# Patient Record
Sex: Male | Born: 1965 | Race: White | Hispanic: No | Marital: Married | Smoking: Never smoker
Health system: Southern US, Community
[De-identification: ages and names within clinical notes are randomized; demographics above are authoritative.]

## PROBLEM LIST (undated history)

## (undated) ENCOUNTER — Emergency Department (HOSPITAL_BASED_OUTPATIENT_CLINIC_OR_DEPARTMENT_OTHER)

## (undated) DIAGNOSIS — K8020 Calculus of gallbladder without cholecystitis without obstruction: Secondary | ICD-10-CM

## (undated) DIAGNOSIS — F329 Major depressive disorder, single episode, unspecified: Secondary | ICD-10-CM

## (undated) DIAGNOSIS — Q632 Ectopic kidney: Secondary | ICD-10-CM

## (undated) DIAGNOSIS — F32A Depression, unspecified: Secondary | ICD-10-CM

## (undated) DIAGNOSIS — R51 Headache: Secondary | ICD-10-CM

## (undated) DIAGNOSIS — G43909 Migraine, unspecified, not intractable, without status migrainosus: Secondary | ICD-10-CM

## (undated) DIAGNOSIS — K219 Gastro-esophageal reflux disease without esophagitis: Secondary | ICD-10-CM

## (undated) DIAGNOSIS — R112 Nausea with vomiting, unspecified: Secondary | ICD-10-CM

## (undated) DIAGNOSIS — I10 Essential (primary) hypertension: Secondary | ICD-10-CM

## (undated) DIAGNOSIS — D649 Anemia, unspecified: Secondary | ICD-10-CM

## (undated) DIAGNOSIS — K602 Anal fissure, unspecified: Secondary | ICD-10-CM

## (undated) DIAGNOSIS — I671 Cerebral aneurysm, nonruptured: Secondary | ICD-10-CM

## (undated) DIAGNOSIS — H269 Unspecified cataract: Secondary | ICD-10-CM

## (undated) DIAGNOSIS — M109 Gout, unspecified: Secondary | ICD-10-CM

## (undated) DIAGNOSIS — T7840XA Allergy, unspecified, initial encounter: Secondary | ICD-10-CM

## (undated) DIAGNOSIS — F419 Anxiety disorder, unspecified: Secondary | ICD-10-CM

## (undated) DIAGNOSIS — J189 Pneumonia, unspecified organism: Secondary | ICD-10-CM

## (undated) DIAGNOSIS — Z9889 Other specified postprocedural states: Secondary | ICD-10-CM

## (undated) DIAGNOSIS — N200 Calculus of kidney: Secondary | ICD-10-CM

## (undated) HISTORY — DX: Anal fissure, unspecified: K60.2

## (undated) HISTORY — DX: Migraine, unspecified, not intractable, without status migrainosus: G43.909

## (undated) HISTORY — DX: Gout, unspecified: M10.9

## (undated) HISTORY — DX: Allergy, unspecified, initial encounter: T78.40XA

## (undated) HISTORY — DX: Calculus of gallbladder without cholecystitis without obstruction: K80.20

## (undated) HISTORY — DX: Unspecified cataract: H26.9

## (undated) HISTORY — DX: Calculus of kidney: N20.0

## (undated) HISTORY — PX: UPPER GASTROINTESTINAL ENDOSCOPY: SHX188

## (undated) HISTORY — PX: CATARACT EXTRACTION: SUR2

## (undated) HISTORY — DX: Anemia, unspecified: D64.9

## (undated) HISTORY — DX: Essential (primary) hypertension: I10

## (undated) HISTORY — PX: OTHER SURGICAL HISTORY: SHX169

## (undated) HISTORY — DX: Gilbert syndrome: E80.4

---

## 1998-09-22 ENCOUNTER — Encounter: Payer: Self-pay | Admitting: Internal Medicine

## 2002-02-24 ENCOUNTER — Emergency Department (HOSPITAL_COMMUNITY): Admission: EM | Admit: 2002-02-24 | Discharge: 2002-02-24 | Payer: Self-pay | Admitting: Emergency Medicine

## 2004-10-18 ENCOUNTER — Ambulatory Visit: Payer: Self-pay | Admitting: Family Medicine

## 2005-01-31 ENCOUNTER — Ambulatory Visit: Payer: Self-pay | Admitting: Internal Medicine

## 2006-05-16 ENCOUNTER — Encounter: Payer: Self-pay | Admitting: Internal Medicine

## 2006-09-19 HISTORY — PX: ARTERIAL ANEURYSM REPAIR: SHX556

## 2006-09-29 ENCOUNTER — Encounter: Payer: Self-pay | Admitting: Internal Medicine

## 2006-09-30 ENCOUNTER — Encounter: Payer: Self-pay | Admitting: Internal Medicine

## 2007-02-07 ENCOUNTER — Encounter: Payer: Self-pay | Admitting: Internal Medicine

## 2007-06-12 DIAGNOSIS — M109 Gout, unspecified: Secondary | ICD-10-CM | POA: Insufficient documentation

## 2007-06-12 DIAGNOSIS — K8020 Calculus of gallbladder without cholecystitis without obstruction: Secondary | ICD-10-CM

## 2007-06-12 HISTORY — DX: Calculus of gallbladder without cholecystitis without obstruction: K80.20

## 2007-09-27 ENCOUNTER — Encounter: Payer: Self-pay | Admitting: Internal Medicine

## 2008-01-21 ENCOUNTER — Ambulatory Visit: Payer: Self-pay | Admitting: Internal Medicine

## 2008-01-21 DIAGNOSIS — L42 Pityriasis rosea: Secondary | ICD-10-CM | POA: Insufficient documentation

## 2008-01-21 DIAGNOSIS — I671 Cerebral aneurysm, nonruptured: Secondary | ICD-10-CM | POA: Insufficient documentation

## 2008-07-06 ENCOUNTER — Other Ambulatory Visit: Payer: Self-pay | Admitting: Emergency Medicine

## 2008-07-06 ENCOUNTER — Ambulatory Visit: Payer: Self-pay | Admitting: Internal Medicine

## 2008-07-06 ENCOUNTER — Inpatient Hospital Stay (HOSPITAL_COMMUNITY): Admission: AD | Admit: 2008-07-06 | Discharge: 2008-07-08 | Payer: Self-pay | Admitting: Internal Medicine

## 2008-07-07 ENCOUNTER — Ambulatory Visit: Payer: Self-pay | Admitting: Internal Medicine

## 2008-07-08 ENCOUNTER — Encounter: Payer: Self-pay | Admitting: Internal Medicine

## 2008-07-14 ENCOUNTER — Ambulatory Visit: Payer: Self-pay | Admitting: Internal Medicine

## 2008-07-14 LAB — CONVERTED CEMR LAB
Basophils Absolute: 0.1 10*3/uL (ref 0.0–0.1)
Basophils Relative: 1.1 % (ref 0.0–3.0)
Eosinophils Absolute: 0.2 10*3/uL (ref 0.0–0.7)
Eosinophils Relative: 3 % (ref 0.0–5.0)
HCT: 35.1 % — ABNORMAL LOW (ref 39.0–52.0)
Hemoglobin: 12.5 g/dL — ABNORMAL LOW (ref 13.0–17.0)
Lymphocytes Relative: 23.3 % (ref 12.0–46.0)
MCHC: 35.6 g/dL (ref 30.0–36.0)
MCV: 85.3 fL (ref 78.0–100.0)
Monocytes Absolute: 0.4 10*3/uL (ref 0.1–1.0)
Monocytes Relative: 4.8 % (ref 3.0–12.0)
Neutro Abs: 5.4 10*3/uL (ref 1.4–7.7)
Neutrophils Relative %: 67.8 % (ref 43.0–77.0)
Platelets: 283 10*3/uL (ref 150–400)
RBC: 4.12 M/uL — ABNORMAL LOW (ref 4.22–5.81)
RDW: 15.7 % — ABNORMAL HIGH (ref 11.5–14.6)
WBC: 7.9 10*3/uL (ref 4.5–10.5)

## 2008-07-16 ENCOUNTER — Telehealth: Payer: Self-pay | Admitting: Internal Medicine

## 2008-07-18 ENCOUNTER — Telehealth: Payer: Self-pay | Admitting: Internal Medicine

## 2008-07-22 ENCOUNTER — Ambulatory Visit: Payer: Self-pay | Admitting: Internal Medicine

## 2008-07-24 LAB — CONVERTED CEMR LAB
Basophils Absolute: 0 10*3/uL (ref 0.0–0.1)
Basophils Relative: 0.1 % (ref 0.0–3.0)
Eosinophils Absolute: 0 10*3/uL (ref 0.0–0.7)
Eosinophils Relative: 0.1 % (ref 0.0–5.0)
HCT: 36.3 % — ABNORMAL LOW (ref 39.0–52.0)
Hemoglobin: 12.9 g/dL — ABNORMAL LOW (ref 13.0–17.0)
Lymphocytes Relative: 8.9 % — ABNORMAL LOW (ref 12.0–46.0)
MCHC: 35.4 g/dL (ref 30.0–36.0)
MCV: 83.3 fL (ref 78.0–100.0)
Monocytes Absolute: 0.3 10*3/uL (ref 0.1–1.0)
Monocytes Relative: 2.3 % — ABNORMAL LOW (ref 3.0–12.0)
Neutro Abs: 11.5 10*3/uL — ABNORMAL HIGH (ref 1.4–7.7)
Neutrophils Relative %: 88.6 % — ABNORMAL HIGH (ref 43.0–77.0)
Platelets: 305 10*3/uL (ref 150–400)
RBC: 4.36 M/uL (ref 4.22–5.81)
RDW: 15.5 % — ABNORMAL HIGH (ref 11.5–14.6)
Uric Acid, Serum: 7.7 mg/dL (ref 4.0–7.8)
WBC: 13 10*3/uL — ABNORMAL HIGH (ref 4.5–10.5)

## 2008-08-06 ENCOUNTER — Ambulatory Visit: Payer: Self-pay | Admitting: Internal Medicine

## 2008-09-16 ENCOUNTER — Ambulatory Visit: Payer: Self-pay | Admitting: Internal Medicine

## 2009-07-15 ENCOUNTER — Ambulatory Visit: Payer: Self-pay | Admitting: Family Medicine

## 2009-09-07 ENCOUNTER — Telehealth: Payer: Self-pay | Admitting: Internal Medicine

## 2009-09-14 ENCOUNTER — Ambulatory Visit: Payer: Self-pay | Admitting: Internal Medicine

## 2009-10-13 ENCOUNTER — Ambulatory Visit: Payer: Self-pay | Admitting: Family Medicine

## 2010-01-29 ENCOUNTER — Telehealth: Payer: Self-pay | Admitting: Internal Medicine

## 2010-01-29 ENCOUNTER — Ambulatory Visit: Payer: Self-pay | Admitting: Internal Medicine

## 2010-02-01 ENCOUNTER — Ambulatory Visit: Payer: Self-pay | Admitting: Cardiovascular Disease

## 2010-02-01 ENCOUNTER — Ambulatory Visit: Payer: Self-pay | Admitting: Internal Medicine

## 2010-02-01 LAB — CONVERTED CEMR LAB
ALT: 20 units/L (ref 0–53)
AST: 21 units/L (ref 0–37)
Albumin: 4.7 g/dL (ref 3.5–5.2)
Alkaline Phosphatase: 63 units/L (ref 39–117)
BUN: 10 mg/dL (ref 6–23)
Basophils Absolute: 0 10*3/uL (ref 0.0–0.1)
Basophils Absolute: 0 10*3/uL (ref 0.0–0.1)
Basophils Relative: 0.3 % (ref 0.0–3.0)
Basophils Relative: 0.3 % (ref 0.0–3.0)
Bilirubin, Direct: 0.2 mg/dL (ref 0.0–0.3)
CO2: 31 meq/L (ref 19–32)
CRP, High Sensitivity: 2.5 (ref 0.00–5.00)
Calcium: 9.7 mg/dL (ref 8.4–10.5)
Chloride: 101 meq/L (ref 96–112)
Creatinine, Ser: 0.9 mg/dL (ref 0.4–1.5)
Eosinophils Absolute: 0 10*3/uL (ref 0.0–0.7)
Eosinophils Absolute: 0 10*3/uL (ref 0.0–0.7)
Eosinophils Relative: 0.4 % (ref 0.0–5.0)
Eosinophils Relative: 0.3 % (ref 0.0–5.0)
GFR calc non Af Amer: 96.51 mL/min (ref >60)
Glucose, Bld: 90 mg/dL (ref 70–99)
HCT: 45.4 % (ref 39.0–52.0)
HCT: 45.8 % (ref 39.0–52.0)
Hemoglobin: 16 g/dL (ref 13.0–17.0)
Hemoglobin: 16.1 g/dL (ref 13.0–17.0)
INR: 1.1 — ABNORMAL HIGH (ref 0.8–1.0)
Lymphocytes Relative: 12.7 % (ref 12.0–46.0)
Lymphocytes Relative: 14.2 % (ref 12.0–46.0)
Lymphs Abs: 1.6 10*3/uL (ref 0.7–4.0)
Lymphs Abs: 1.6 10*3/uL (ref 0.7–4.0)
MCHC: 35.3 g/dL (ref 30.0–36.0)
MCHC: 35.2 g/dL (ref 30.0–36.0)
MCV: 85.6 fL (ref 78.0–100.0)
MCV: 85.5 fL (ref 78.0–100.0)
Monocytes Absolute: 0.4 10*3/uL (ref 0.1–1.0)
Monocytes Absolute: 0.4 10*3/uL (ref 0.1–1.0)
Monocytes Relative: 3.5 % (ref 3.0–12.0)
Monocytes Relative: 3.7 % (ref 3.0–12.0)
Neutro Abs: 10.2 10*3/uL — ABNORMAL HIGH (ref 1.4–7.7)
Neutro Abs: 9.3 10*3/uL — ABNORMAL HIGH (ref 1.4–7.7)
Neutrophils Relative %: 83.1 % — ABNORMAL HIGH (ref 43.0–77.0)
Neutrophils Relative %: 81.5 % — ABNORMAL HIGH (ref 43.0–77.0)
Platelets: 297 10*3/uL (ref 150.0–400.0)
Platelets: 283 10*3/uL (ref 150.0–400.0)
Potassium: 4.2 meq/L (ref 3.5–5.1)
Prothrombin Time: 11.1 s (ref 9.1–11.7)
RBC: 5.3 M/uL (ref 4.22–5.81)
RBC: 5.36 M/uL (ref 4.22–5.81)
RDW: 15.9 % — ABNORMAL HIGH (ref 11.5–14.6)
RDW: 15.2 % — ABNORMAL HIGH (ref 11.5–14.6)
Sodium: 140 meq/L (ref 135–145)
Total Bilirubin: 2 mg/dL — ABNORMAL HIGH (ref 0.3–1.2)
Total Protein: 7.8 g/dL (ref 6.0–8.3)
WBC: 12.3 10*3/uL — ABNORMAL HIGH (ref 4.5–10.5)
WBC: 11.4 10*3/uL — ABNORMAL HIGH (ref 4.5–10.5)
aPTT: 29.9 s — ABNORMAL HIGH (ref 21.7–28.8)

## 2010-02-10 ENCOUNTER — Ambulatory Visit: Payer: Self-pay | Admitting: Internal Medicine

## 2010-03-29 ENCOUNTER — Telehealth: Payer: Self-pay | Admitting: Internal Medicine

## 2010-03-30 ENCOUNTER — Ambulatory Visit: Payer: Self-pay | Admitting: Internal Medicine

## 2010-03-30 DIAGNOSIS — M549 Dorsalgia, unspecified: Secondary | ICD-10-CM | POA: Insufficient documentation

## 2010-03-31 ENCOUNTER — Telehealth: Payer: Self-pay | Admitting: Internal Medicine

## 2010-04-12 ENCOUNTER — Ambulatory Visit: Payer: Self-pay | Admitting: Internal Medicine

## 2010-04-14 ENCOUNTER — Encounter: Admission: RE | Admit: 2010-04-14 | Discharge: 2010-04-14 | Payer: Self-pay | Admitting: Internal Medicine

## 2010-09-19 DIAGNOSIS — N200 Calculus of kidney: Secondary | ICD-10-CM

## 2010-09-19 HISTORY — DX: Calculus of kidney: N20.0

## 2010-10-09 ENCOUNTER — Encounter: Payer: Self-pay | Admitting: Internal Medicine

## 2010-10-12 ENCOUNTER — Ambulatory Visit
Admission: RE | Admit: 2010-10-12 | Discharge: 2010-10-12 | Payer: Self-pay | Source: Home / Self Care | Attending: Internal Medicine | Admitting: Internal Medicine

## 2010-10-19 NOTE — Assessment & Plan Note (Signed)
Summary: GOUT  // RS   Vital Signs:  Patient profile:   45 year old [place] Weight:      227 pounds Temp:     98.4 degrees F oral BP sitting:   130 / 108  (left arm) Cuff size:   regular  Vitals Entered By: Kern Reap CMA Duncan Dull) (September 14, 2009 10:08 AM)  Reason for Visit gout right grand toe Pain Assessment Patient in pain? 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 Once Daily As Needed 2)  Imitrex 100 Mg Tabs (Sumatriptan Succinate) .... 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 Gilbert's Cholelithiasis Gout cerebral aneurysms---has regular followup-has had angiogram Headache GI bleed--thought secondary to [employer] tear  Past Surgical History: Last updated: 06/12/2007 arthroscopic L and R knees R cartilage tear migraine headache  Family History: Last updated: 08/06/2008 Family History of Prostate Cancer: father No FH of Colon Cancer:  Social History: Last updated: 08/06/2008 recently moved back To [place] Married 2 sons, 1 daughter Never Smoked Alcohol Use - yes 2-3/week Daily Caffeine Use Occupation: Clinical research associate Illicit Drug Use - no  Risk Factors: Alcohol Use: <1 (08/06/2008) Caffeine Use: 1 (08/06/2008)  Risk Factors: Smoking Status: never  (01/21/2008)  Physical Exam  General:  coughs a lot, alert Msk:  normal ROM and no joint warmth except right mtp erythema and warmth---also swollen  Neurologic:  cranial nerves II-XII intact and gait normal.     Impression & Recommendations:  Problem # 1:  GOUT (ICD-274.9) first recurrence in 1.5 years treat with indocin---reviewed previous hospitalization notes suspect GI bleed was more mallory weiss tear than anything else will use PPI when he uses NSAID discussed at length no need for labs today call if sxs persist  Complete Medication List: 1)  Tramadol Hcl 50 Mg Tabs (Tramadol hcl) .... Once daily as needed 2)  Imitrex 100 Mg Tabs (Sumatriptan succinate) .... Once daily as needed 3)  Indomethacin Cr 75 Mg Cr-caps (Indomethacin) .... Take 1 tablet by mouth two times a day as needed gout flare 4)  Omeprazole 20 Mg Cpdr (Omeprazole) .... One by mouth daily Prescriptions: TRAMADOL HCL 50 MG  TABS (TRAMADOL HCL) once daily as needed  #20 x 3   Entered and Authorized by:   Birdie Sons MD   Signed by:   Birdie Sons MD on 09/14/2009   Method used:   Electronically to        [facility]  [phone]* (retail)       [address] [place], [place]  [address]       Ph: [phone] or [phone]       Fax: [phone]   RxID:   [id number] OMEPRAZOLE 20 MG CPDR (OMEPRAZOLE) one by mouth daily  #30 x 3   Entered and Authorized  by:   Birdie Sons MD   Signed by:   Birdie Sons MD on 09/14/2009   Method used:   Electronically to        [facility]  [phone]* (retail)       [address] [place], [place]  [address]       Ph: [phone] or [phone]       Fax: [phone]   RxID:   [id number] INDOMETHACIN CR 75 MG CR-CAPS (INDOMETHACIN) Take 1 tablet by mouth two times a day as needed gout flare  #30 x 2   Entered and Authorized by:   Birdie Sons MD   Signed by:   Birdie Sons MD on 09/14/2009   Method used:   Electronically to        [facility]  [phone]* (retail)       [address] [place], [place]  [address]       Ph: [phone] or [phone]       Fax: [phone]   RxID:   [id number]

## 2010-10-19 NOTE — Assessment & Plan Note (Signed)
Summary: hosp follow up melissa osullivan/mhf   Vital Signs:  Patient Profile:   45 Years Old Male Weight:      214 pounds Temp:     98 degrees F Pulse rate:   74 / minute BP sitting:   118 / 76  (left arm)  Vitals Entered By: Gladis Riffle, RN (July 22, 2008 10:34 AM)                 Chief Complaint:  hospital FU--hematemesis following H1N1 diagnosis--discharged 07/08/08.  History of Present Illness:  PITYRIASIS ROSEA (ICD-696.3)--resolved GILBERT'S SYNDROME (ICD-277.4)---lab finding only GOUT (ICD-274.9)---has had recurrence off of NSAID, took prednisone....resolved recent GI bleed thought secondary to [employer] tear---hematemesis in context of viral infection and NSAID      Updated Prior Medication List: TOPAMAX 50 MG TABS (TOPIRAMATE) 1 by mouth two times a day.qhs TRAMADOL HCL 50 MG  TABS (TRAMADOL HCL) once daily as needed ALPRAZOLAM 0.5 MG  TABS (ALPRAZOLAM) once daily as needed for pain IMITREX 100 MG TABS (SUMATRIPTAN SUCCINATE) once daily as needed COLCHICINE 0.6 MG TABS (COLCHICINE) one three times a day today & one two times a day tomorrow PREDNISONE (PAK) 10 MG TABS (PREDNISONE) As directed per pack PROTONIX 40 MG TBEC (PANTOPRAZOLE SODIUM) Take 1 tablet by mouth two times a day x 1 month then Take 1 tablet by mouth once a day  Current Allergies (reviewed today): No known allergies   Past Medical History:    "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    Gilbert's    Cholelithiasis    Gout    cerebral aneurysms---has regular followup-has had angiogram    Headache    GI bleed--thought secondary to [employer] tear  Past Surgical History:    Reviewed history from 06/12/2007 and no changes required:       arthroscopic L and R knees       R cartilage tear       migraine headache   Social History:    Reviewed history from 01/21/2008 and no changes required:       recently moved back To [place]       Married       Never Smoked    Review of Systems       no other complaints in a complete ROS    Physical Exam  General:     Well-developed,well-nourished,in no acute distress; alert,appropriate and cooperative throughout examination Head:     normocephalic and atraumatic.   Eyes:     pupils equal and pupils round.   Neck:     No deformities, masses, or tenderness noted. Chest Wall:     No deformities, masses, tenderness or gynecomastia noted. Lungs:     Normal respiratory effort, chest expands symmetrically. Lungs are clear to auscultation, no crackles or wheezes. Heart:     Normal rate and regular rhythm. S1 and S2 normal without gallop, murmur, click, rub or other extra sounds. Abdomen:     Bowel sounds positive,abdomen soft and non-tender without masses, organomegaly or hernias noted. Msk:     No deformity or scoliosis noted of thoracic or lumbar spine.   Pulses:     R radial normal and L radial normal.   Neurologic:     cranial nerves II-XII intact and gait normal.   Skin:     turgor normal and color normal.   Psych:     normally interactive and good eye contact.      Impression & Recommendations:  Problem #  1:  ANEMIA (ICD-285.9) note recent GI bleed likely mallory weiss tear Orders: TLB-CBC Platelet - w/Differential (85025-CBCD)   Problem # 2:  PITYRIASIS ROSEA (ICD-696.3) Assessment: Improved  Problem # 3:  GOUT (ICD-274.9) check labs i think ok to use periodic nsaid GI bleed not likely nsaid induced His updated medication list for this problem includes:    Colchicine 0.6 Mg Tabs (Colchicine) ..... One three times a day today & one two times a day tomorrow  Orders: Venipuncture ([address]) TLB-Uric Acid, Blood (84550-URIC)   Complete Medication List: 1)  Topamax 50 Mg Tabs (Topiramate) .Marland Kitchen.. 1 by mouth two times a day.qhs 2)  Tramadol Hcl 50 Mg Tabs (Tramadol hcl) .... Once daily as needed 3)  Alprazolam 0.5 Mg Tabs (Alprazolam) .... Once daily as needed for pain 4)  Imitrex 100 Mg Tabs (Sumatriptan  succinate) .... Once daily as needed 5)  Colchicine 0.6 Mg Tabs (Colchicine) .... One three times a day today & one two times a day tomorrow 6)  Protonix 40 Mg Tbec (Pantoprazole sodium) .... Take 1 tablet by mouth two times a day x 1 month then take 1 tablet by mouth once a day    Prescriptions: IMITREX 100 MG TABS (SUMATRIPTAN SUCCINATE) once daily as needed  #9 Tablet x 3   Entered and Authorized by:   Birdie Sons MD   Signed by:   Birdie Sons MD on 07/22/2008   Method used:   Print then Give to Patient   RxID:   [id number] TRAMADOL HCL 50 MG  TABS (TRAMADOL HCL) once daily as needed  #20 x 1   Entered and Authorized by:   Birdie Sons MD   Signed by:   Birdie Sons MD on 07/22/2008   Method used:   Print then Give to Patient   RxID:   [id number]  ]  Tetanus/Td Immunization History:    Tetanus/Td # 1:  Td (05/16/2006)

## 2010-10-19 NOTE — Progress Notes (Signed)
Summary: new rx  Phone Note Call from Patient Call back at Work Phone [phone]   Caller: Patient Call For: Birdie Sons MD Summary of Call: pt would like a new rx for colchine [facility] [address] [phone] Initial call taken by: Heron Sabins,  September 07, 2009 10:12 AM  Follow-up for Phone Call        colchicine no longer made---there is not a substitute Follow-up by: Birdie Sons MD,  September 07, 2009 5:23 PM  Additional Follow-up for Phone Call Additional follow up Details #1::        left message on pt voice mail of cell to inform. Additional Follow-up by: Gladis Riffle, RN,  September 08, 2009 9:24 AM

## 2010-10-19 NOTE — Assessment & Plan Note (Signed)
Summary: fu on back pain/njr   Vital Signs:  Patient profile:   45 year old male Weight:      224 pounds Temp:     98.5 degrees F oral Pulse rate:   86 / minute Pulse rhythm:   regular BP sitting:   142 / 98  (left arm) Cuff size:   regular  Vitals Entered By: Kern Reap CMA Duncan Dull) (April 12, 2010 9:11 AM) CC: follow-up visit for back pain Is Patient Diabetic? No Pain Assessment Patient in pain? yes     Location: back Intensity: 6 Type: sharp Onset of pain  Constant   Primary Care Provider:  Birdie Sons, MD  CC:  follow-up visit for back pain.  History of Present Illness: progressive mid back pain now with radiation to right anterior chest pain can be severe especially with flexion of neck pain is some better with ibuprofen and benadryl (helps him get 5-6 hours of sleep).  pain can worsen with deep breathing/cough.   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 Gilbert's Cholelithiasis Gout cerebral aneurysms---has regular followup-has had angiogram MIGRAINES GI bleed--thought secondary to [employer] tear 2009  Past Surgical History: Last updated: 02/01/2010 arthroscopic L and R knees  Family History: Last updated: 02/01/2010 Family History of Prostate Cancer: father No FH of Colon Cancer Family History of Diabetes: sister  Social History: Last updated: 02/01/2010 recently moved back To [place] Married 2 sons, 1 daughter Never Smoked Alcohol Use - yes 2-3/week Daily Caffeine Use- 1 per day coffee Occupation: Clinical research associate Illicit Drug Use - no  Risk Factors: Alcohol Use: <1 (01/29/2010) Caffeine Use: 1 (08/06/2008)  Risk Factors: Smoking Status: never (01/29/2010)  Physical Exam  General:  alert and well-developed.   Head:  normocephalic and atraumatic.   Eyes:  pupils equal and pupils round.   Ears:  R ear normal and L ear normal.   Msk:  no  pain to palpation of back   Impression & Recommendations:  Problem # 1:  BACK PAIN (ICD-724.5)  reviewed records and imaging including CT scan has tried steroids and NSAIDs---no significant relief  When looking back at chart this reallyhas been ongoing since january 2011  His updated medication list for this problem includes:    Excedrin Tension Headache 500-65 Mg Tabs (Acetaminophen-caffeine) .Marland Kitchen... Take one by mouth as needed    Motrin Ib 200 Mg Tabs (Ibuprofen) .Marland Kitchen... Take 4 tabs two times a day  Orders: Radiology Referral (Radiology)  Problem # 2:  CHOLELITHIASIS (ICD-574.20) doubt contributing to current sxs but worth noting  Complete Medication List: 1)  Imitrex 100 Mg Tabs (Sumatriptan succinate) .... Once daily as needed 2)  Omeprazole 20 Mg Cpdr (Omeprazole) .... One by mouth daily as needed use withnsaid. 3)  Excedrin Tension Headache 500-65 Mg Tabs (Acetaminophen-caffeine) .... Take one by mouth as needed 4)  Motrin Ib 200 Mg Tabs (Ibuprofen) .... Take 4 tabs two times a day

## 2010-10-19 NOTE — Progress Notes (Signed)
Summary: back pain  Phone Note Call from Patient Call back at Work Phone [phone]   Summary of Call: Back pain continues.  Motrin 4am & 3hs not giving much relief, especially at night.  Not sleeping.  Using moist heat.  Has had massage x2.  What to do?  PT, prednisone, med?  [facility] Initial call taken by: Rudy Jew, RN,  March 31, 2010 5:02 PM  Follow-up for Phone Call        see rx and refer to PT Follow-up by: Birdie Sons MD,  March 31, 2010 5:17 PM  Additional Follow-up for Phone Call Additional follow up Details #1::        Phone Call Completed Additional Follow-up by: Rudy Jew, RN,  March 31, 2010 5:52 PM    New/Updated Medications: PREDNISONE 20 MG TABS (PREDNISONE) 2 by mouth once daily for 4 days then 1/2 by mouth once daily for 4 days Prescriptions: PREDNISONE 20 MG TABS (PREDNISONE) 2 by mouth once daily for 4 days then 1/2 by mouth once daily for 4 days  #15 x 0   Entered and Authorized by:   Birdie Sons MD   Signed by:   Birdie Sons MD on 03/31/2010   Method used:   Electronically to        [facility]  [phone]* (retail)       [address] [place], [place]  [address]       Ph: [phone] or [phone]       Fax: [phone]   RxID:   [id number]

## 2010-10-19 NOTE — Assessment & Plan Note (Signed)
Summary: acute back pain/dm   Vital Signs:  Patient profile:   45 year old male Height:      70 inches (177.80 cm) Weight:      221 pounds (100.45 kg) Temp:     98.0 degrees F (36.67 degrees C) oral Pulse rate:   84 / minute BP sitting:   130 / 92  (left arm) Cuff size:   regular  Vitals Entered By: Josph Macho RMA (March 30, 2010 8:12 AM) CC: Acute back pain X3 weeks/ CF Is Patient Diabetic? No   Primary Care Provider:  Birdie Sons, MD  CC:  Acute back pain X3 weeks/ CF.  History of Present Illness: Mid thoracic back pain that can radiate to right side.  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 Once Daily As Needed 2)  Omeprazole 20 Mg Cpdr (Omeprazole) .... One By Mouth Daily As Needed Use Withnsaid. 3)  Excedrin Tension Headache 500-65 Mg Tabs (Acetaminophen-Caffeine) .... Take One By Mouth As Needed 4)  Motrin Ib 200 Mg Tabs (Ibuprofen) .... 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 Gilbert's Cholelithiasis Gout cerebral aneurysms---has regular followup-has had angiogram MIGRAINES GI bleed--thought secondary to [employer] tear 2009  Past Surgical History: Last updated: 02/01/2010 arthroscopic L and R knees  Family History: Last updated: 02/01/2010 Family History of Prostate Cancer: father No FH of Colon Cancer Family History of Diabetes:  sister  Social History: Last updated: 02/01/2010 recently moved back To [place] Married 2 sons, 1 daughter Never Smoked Alcohol Use - yes 2-3/week Daily Caffeine Use- 1 per day coffee Occupation: Clinical research associate Illicit Drug Use - no  Risk Factors: Alcohol Use: <1 (01/29/2010) Caffeine Use: 1 (08/06/2008)  Risk Factors: Smoking Status: never (01/29/2010)  Physical Exam  General:  alert and well-developed.   Head:  normocephalic and atraumatic.   Eyes:  pupils equal and pupils round.   Ears:  R ear normal and L ear normal.   Neck:  No deformities, masses, or tenderness noted. Lungs:  normal respiratory effort and no intercostal retractions.   Heart:  normal rate and regular rhythm.   Msk:  no back pain to palpation no rash   Impression & Recommendations:  Problem # 1:  BACK PAIN (ICD-724.5)  has tried some conservative therapy will check xray and then decide course of treatment.  His updated medication list for this problem includes:    Excedrin Tension Headache 500-65 Mg Tabs (Acetaminophen-caffeine) .Marland Kitchen... Take one by mouth as needed    Motrin Ib 200 Mg Tabs (Ibuprofen) ..... 800mg  two times a day as needed for back pain  Orders: T-Thoracic Spine 2 Views [id number])  Complete Medication List: 1)  Imitrex 100 Mg Tabs (Sumatriptan succinate) .... Once daily as needed 2)  Omeprazole 20 Mg Cpdr (Omeprazole) .... One by mouth daily as needed use withnsaid. 3)  Excedrin Tension Headache 500-65 Mg Tabs (Acetaminophen-caffeine) .... Take one by mouth as needed 4)  Motrin  Ib 200 Mg Tabs (Ibuprofen) .... 800mg  two times a day as needed for back pain

## 2010-10-19 NOTE — Assessment & Plan Note (Signed)
Summary: cough/njr   Vital Signs:  Patient profile:   45 year old male Weight:      228 pounds BMI:     32.83 Temp:     98.1 degrees F oral BP sitting:   132 / 90  (left arm) Cuff size:   large  Vitals Entered By: Alfred Levins, CMA (July 15, 2009 4:20 PM) CC: cough off and on x2 wks   History of Present Illness: For 2 weeks has had a dry cough and chest congestion. No fever. During this time 2 of his children have been diagnosed with pneumonia, and they are getting better on antibiotics. Drinking fluids and using Mucinex.   Allergies (verified): No Known Drug Allergies  Past History:  Past Medical History: Reviewed history from 07/22/2008 and no changes required. 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 arthroscopic L and R knees R cartilage tear migraine headache  Review of Systems  The patient denies anorexia, fever, weight loss, weight gain, vision loss, decreased hearing, hoarseness, chest pain, syncope, peripheral edema, headaches, hemoptysis, abdominal pain, melena, hematochezia, severe indigestion/heartburn, hematuria, incontinence, genital sores, muscle weakness, suspicious skin lesions, transient blindness, difficulty walking, depression, unusual weight change, abnormal bleeding, enlarged lymph nodes, angioedema, breast masses, and testicular masses.    Physical Exam  General:  coughs a lot, alert Head:  Normocephalic and atraumatic without obvious abnormalities. No apparent alopecia or balding. Eyes:  No corneal or conjunctival inflammation noted. EOMI. Perrla. Funduscopic exam benign, without hemorrhages, exudates or papilledema. Vision grossly normal. Ears:  External ear exam shows no significant lesions or deformities.  Otoscopic  examination reveals clear canals, tympanic membranes are intact bilaterally without bulging, retraction, inflammation or discharge. Hearing is grossly normal bilaterally. Nose:  External nasal examination shows no deformity or inflammation. Nasal mucosa are pink and moist without lesions or exudates. Mouth:  Oral mucosa and oropharynx without lesions or exudates.  Teeth in good repair. Neck:  No deformities, masses, or tenderness noted. Lungs:  scattered rhonchi and wheezes, no rales   Impression & Recommendations:  Problem # 1:  ACUTE BRONCHITIS (ICD-466.0)  His updated medication list for this problem includes:    Zithromax Z-pak 250 Mg Tabs (Azithromycin) .Marland Kitchen... As directed  Complete Medication List: 1)  Tramadol Hcl 50 Mg Tabs (Tramadol hcl) .... Once daily as needed 2)  Imitrex 100 Mg Tabs (Sumatriptan succinate) .... Once daily as needed 3)  Protonix 40 Mg Tbec (Pantoprazole sodium) .... Take 1 tablet by mouth two times a day x 1 month then take 1 tablet by mouth once a day 4)  Indomethacin Cr 75 Mg Cr-caps (Indomethacin) .... Take as needed 5)  Zithromax Z-pak 250 Mg Tabs (Azithromycin) .... As directed  Patient Instructions: 1)  Please schedule a follow-up appointment as needed .  Prescriptions: ZITHROMAX Z-PAK 250 MG TABS (AZITHROMYCIN) as directed  #1 x 0   Entered and Authorized by:   Nelwyn Salisbury MD   Signed by:   Nelwyn Salisbury MD on 07/15/2009   Method used:   Electronically to        [facility]  [phone]* (retail)       [address] [place], [place]  [address]       Ph: [phone] or [phone]       Fax: [phone]  RxID:   [id number]

## 2010-10-19 NOTE — Assessment & Plan Note (Signed)
Summary: constipation / rectal bleeding   History of Present Illness Visit Type: Follow-up Consult Primary GI MD: Stan Head MD [facility] Primary Provider: Birdie Sons, MD Requesting Provider: Birdie Sons, MD Chief Complaint: Patient referred for BRB last week which he had to leave work for and change his underwear. He complains of hemrrhoids which cause some rectal pain with bowel movements. He is having lower abdominal pain along with nausea. He is taking Miralax to help with the constipation but ni result so far.  History of Present Illness:   45 YO MALE KNOWN TO DR. Leone Costa FROM EVALUATION IN 2009 FORSELF-LIMITED  HEMATEMESIS. HE UNDERWENT EGD WHICH WAS NORMAL. IT WAS FELT HE HAD A SMALL MALLORY WEISS TEAR,VS NSAID INDUCED GASTRITIS WHICH HAD HEALED WHEN EGD WAS DONE.  HE COMES IN TODAY  WITH ONSET ABOUT 3 WEEKS AGO  OF CHANGE IN BOWEL HABITS WITH CONSTIPATION WHICH HE HAS NEVER HAD PROBLEMS WITH.ABOUT  2 WEEKS AGO HE STARTED HAVIG INTERMITTENT "SPOTTY" BRB PER RECTUM WHICH HE ATTRIBUTED TO A HEMORRHOID. ABOUT ONE WEEK AGO HE BECAME ILL WITH  A "STOMACH VIRUS" WITH ABDOMINAL BLOATING ,NAUSEA,VOMITING,AND DIARRHEA FOR AT LEAST 24 HOURS. HE FELT OK AFTER THAT FOR 2 DAYS THEN ON 5/12 HAD EPISODE AT WORK OF RECTAL BLEEDING WITH OOZING-HAD TO CHANGE CLOTHES TWICE THAT DAY DUE TO BLEEDING. HE SAW DR SWORDS 5/13,-CBC-WBC 12.3,HGB16.0,LFTS NORMAL EXCEPT TBILI 2.0(HAS GILBERTS).HE WAS STARTED ON MIRALAX. OVER THE WEEKEND HE WAS ABLE TO EAT BUT HAS FELT BLOATED AND MISERABLE,NO FURTHER BLEEDING. HE REPORTS INTERMITTENT SWEATS,ABDOMINAL CRAMPING, AND NIGHTIME SXS WITH DRY HEAVES,AND UP EVERY 45 MINUTES  WITH CRAMPING-FEELING LIKE HE NEEDS TO HAVE DIARRHEA BUT UNABLE TO PASS ANYTHING. HIS WIFE WAS ALSO SICK FOR A FEW DAYS  ABOUT A WEEK AGO-BETTER NOW.   GI Review of Systems    Reports abdominal pain, bloating, loss of appetite, nausea, and  vomiting.     Location of  Abdominal pain: generalized.    Denies  acid reflux, belching, chest pain, dysphagia with liquids, dysphagia with solids, heartburn, vomiting blood, and  weight loss.      Reports change in bowel habits, constipation, diarrhea, and  rectal bleeding.     Denies black tarry stools, diverticulosis, fecal incontinence, heme positive stool, hemorrhoids, irritable bowel syndrome, jaundice, light color stool, liver problems, and  rectal pain.    Current Medications (verified): 1)  Imitrex 100 Mg Tabs (Sumatriptan Succinate) .... Once Daily As Needed 2)  Omeprazole 20 Mg Cpdr (Omeprazole) .... One By Mouth Daily As Needed Use Withnsaid. 3)  Miralax   Powd (Polyethylene Glycol 3350) .Marland Kitchen.. 17g By Mouth Once Daily As Needed Constipation 4)  Excedrin Tension Headache 500-65 Mg Tabs (Acetaminophen-Caffeine) .... 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 Cholelithiasis Gout cerebral aneurysms---has regular followup-has had angiogram MIGRAINES GI bleed--thought secondary to [employer] tear 2009  Past Surgical History: arthroscopic L and R knees  Family History: Family History of Prostate Cancer: father No FH of Colon Cancer Family History of Diabetes: sister  Social History: recently moved back To [place] Married 2 Costa, 1 daughter Never Smoked Alcohol Use - yes 2-3/week Daily Caffeine Use- 1 per day coffee Occupation: Clinical research associate Illicit Drug Use - no  Review of Systems       The patient complains of allergy/sinus, arthritis/joint pain, back pain, blood in urine, muscle pains/cramps, night sweats, and sleeping problems.  The patient denies  anemia, anxiety-new, breast changes/lumps, change in vision, confusion, cough, coughing up blood, depression-new, fainting, fatigue, fever, headaches-new, hearing problems, heart murmur, heart rhythm changes, itching, menstrual pain, nosebleeds, pregnancy symptoms, shortness of breath,  skin rash, sore throat, swelling of feet/legs, swollen lymph glands, thirst - excessive , urination - excessive , urination changes/pain, urine leakage, vision changes, and voice change.         ROS OTHERWISE AS IN HPI  Vital Signs:  Patient profile:   45 year old male Height:      70 inches Weight:      216.4 pounds BMI:     31.16 Pulse rate:   84 / minute Pulse rhythm:   regular BP sitting:   142 / 90  (right arm) Cuff size:   regular  Vitals Entered By: Harlow Mares CMA Duncan Dull) (Feb 01, 2010 8:35 AM)  Physical Exam  General:  Well developed, well nourished, no acute distress. Head:  Normocephalic and atraumatic. Eyes:  PERRLA, no icterus. Lungs:  Clear throughout to auscultation. Heart:  Regular rate and rhythm; no murmurs, rubs,  or bruits. Abdomen:  SOFT,MILD DIFFUSE TENDERNESS BUT MORE MARKED TENDERNESS IN THE RLQ WITH GUARDING,NO REBOUND, NO MASS OR HSM,BS+ Rectal:  SMALL EXTERNAL HEMORRHOID,NONBLEEDING,STOOL BROWN,LOOSE,HEME POSITIVE Extremities:  No clubbing, cyanosis, edema or deformities noted. Neurologic:  Alert and  oriented x4;  grossly normal neurologically. Psych:  Alert and cooperative. Normal mood and affect.   Impression & Recommendations:  Problem # 1:  ABDOMINAL PAIN -GENERALIZED (ICD-789.07) Assessment New 45 Y.O MALE WITH  3 WEEK HX OF CHANGE IN BOWEL HABITS WITH NEW ONSET CONSTIPATION-FOLLOWED BY ONSET INTERMITTENT HEMATOCHEZIA, THEN ONE WEEK HX OF ABDOMINAL PAIN,BLOATING,NAUSEA,DRY HEAVES,2 DAYS OF DIARRHEA,SIGNIFICANT RLQ TENDERNESS. ETIOLOGY IS UNCLEAR-WHILE THIS MAY BE AN INFECTIOUS PROCESS,CANNOT EXPLAIN INITIAL BOWEL CHANGE AND BLEEDING/HEME POSITIVE STOOL.  LABS AS BELOW SCHEDULE FOR CT ABDOMEN/PELVIS TODAY PUSH FLUIDS PHENERGAN 25 MG Q 6 HOURS AS NEEDED FOR NAUSEA BENTYL 10 ,3 TIMES DAILY AS NEEDED FOR CRAMPING SAMPLES OF PRILOSEC 20 MG DAILY X 2 WEEKS PROVIDED. SAMPLE OF ANALPRAM 2.5 GIVEN FOR TRIAL as needed RECTAL DISCOMFORT. HE MAY NEED  COLONOSCOPY,AWAIT CT FINDINGS.   Problem # 2:  GILBERTS SYNDROME Assessment: Comment Only  Other Orders: TLB-BMP (Basic Metabolic Panel-BMET) (80048-METABOL) TLB-CRP-High Sensitivity (C-Reactive Protein) (86140-FCRP) TLB-CBC Platelet - w/Differential (85025-CBCD) CT Abdomen/Pelvis with Contrast (CT Abd/Pelvis w/con)  Patient Instructions: 1)  Your physician has requested that you have the following labwork done today: Go to basement level. 2)  We scheduled the CT scan at [facility] [address] for today at 02-01-10.  3)  We sent electronically  the Phenergan and Bentyl to your pharmacy  4)  [facility] [address] 5)  We gave you samples of Analpram Kit and Prilosec OTC.  6)  Copy sent to : ccf:  John Sons, MD 7)  The medication list was reviewed and reconciled.  All changed / newly prescribed medications were explained.  A complete medication list was provided to the patient / caregiver. Prescriptions: PROMETHAZINE HCL 25 MG TABS (PROMETHAZINE HCL) Take 1/2 to 1 tab every 6 hours as needed for nausea  #45 x 0   Entered by:   Lowry Ram NCMA   Authorized by:   Sammuel Cooper PA-c   Signed by:   Lowry Ram NCMA on 02/01/2010   Method used:   [facility]  [address]  [phone]* (retail)  [address] [place], [place]  [address]       Ph: [phone] or [phone]       Fax: [phone]   RxID:   [id number] BENTYL 10 MG CAPS (DICYCLOMINE HCL) Take 1 tab 3 times daily as needed  for pain and spasms  #90 x 1   Entered by:   Lowry Ram NCMA   Authorized by:   Sammuel Cooper PA-c   Signed by:   Lowry Ram NCMA on 02/01/2010   Method used:   [facility] to        [facility]  [phone]* (retail)       [address] [place], [place]  [address]       Ph: [phone] or [phone]       Fax: [phone]   RxID:   [id number]

## 2010-10-19 NOTE — Miscellaneous (Signed)
Summary: [facility] Vaccination/[facility]  [facility] Vaccination/[facility]   Imported By: Maryln Gottron 07/28/2008 16:00:38  _____________________________________________________________________  External Attachment:    Type:   Image     Comment:   External Document

## 2010-10-19 NOTE — Assessment & Plan Note (Signed)
Summary: POST HOSPITAL HEMETEMESIS/YF   History of Present Illness Visit Type: new patient Primary GI MD: Stan Head MD [facility] Chief Complaint: post hospitalization hematemesis History of Present Illness:   45 yo white man with hematemesis in October, was admitted to [facility]. Hgb was mildly low and he had H1N1 at the time. Had been using NSAID's for myalgias, Bleeding resolved in hospital and was discharged for outpatient follow-up, on PPI. He has had no more bleeding. He has had  intermittent dysphagia and it has increased in frequency since hopsitalization, triigered by solids. it is much worse if he has beer.  His gout flared and colchicine didn't work so he took a short course of prednisone with relief. Has been avoiding indomethacin due to prior hematemesis.  No more H1N1 problems.             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       Gilbert's       Cholelithiasis       Gout       cerebral aneurysms---has regular followup-has had angiogram       Headache       GI bleed--thought secondary to [employer] tear  Past Surgical History:    Reviewed history from 06/12/2007 and no changes required:       arthroscopic L and R knees       R cartilage tear       migraine headache   Family History:    Reviewed history and no changes required:       Family History of Prostate Cancer: father       No FH of Colon Cancer:  Social History:   recently moved back To [place]    Married 2 sons, 1 daughter    Never Smoked    Alcohol Use - yes 2-3/week    Daily Caffeine Use    Occupation: Clinical research associate    Illicit Drug Use - no   Risk Factors:  Tobacco use:  never Drug use:  no Caffeine use:  1 drinks per day Alcohol use:  yes    Drinks per day:  <1    Vital Signs:  Patient Profile:   45 Years Old Male Height:     70 inches Weight:      217.50 pounds BMI:     31.32 BSA:     2.16 Pulse rate:   80 / minute Pulse rhythm:   regular BP sitting:   136 / 82  (right arm)  Vitals Entered By: Milford Cage CMA (August 06, 2008 11:04 AM)                  Physical Exam  General:     Well developed, well nourished, no acute distress.obese.   Lungs:     Clear throughout to auscultation. Heart:     Regular rate and rhythm; no murmurs, rubs,  or bruits. Abdomen:  soft, non-tender Neurologic:     Alert and  oriented x4    Impression & Recommendations:  Problem # 1:  HEMATEMESIS (ICD-578.0) Assessment: Improved This has resolved and was probably due to NSAID gastritis or Mallory-weiss tear. However, will evaluate to exclude other causes. Risks, benefits,and indications of endoscopic procedure(s) were reviewed with the patient and all questions answered.  Orders: EGD (EGD)   Problem # 2:  DYSPHAGIA (ICD-787.29) Assessment: New Apparently had this prior to hosptalization but now worse. ? GERD/peptic stricture vs. motility or possibly neoplasm (unlikely) Risks, benefits,and indications of endoscopic procedure(s) were reviewed with the patient and all questions answered.  Orders: EGD (EGD)   Problem # 3:  GOUT (ICD-274.9) Assessment: Comment Only Avoid indomethacin for now   Patient Instructions: 1)  Avoid indomethacin but could use prednisone if needed for gout 2)  Chew food carefully and eat slowly, always upright. 3)  Warm or room temerature lquids will be better tolerated than very cold liquids re:  swallowing. 4)  Copy Sent To: Dr. Birdie Sons    ]

## 2010-10-19 NOTE — Miscellaneous (Signed)
Summary: CT ABD/Pelvis  Clinical Lists Changes  Orders: Added new Referral order of CT Abdomen/Pelvis w/o Contrast (CT ABD/Pel w/o con) - Signed 

## 2010-10-19 NOTE — Assessment & Plan Note (Signed)
Summary: severe upper back pain from injury/cjr   Vital Signs:  Patient profile:   45 year old male Weight:      224 pounds Temp:     97.9 degrees F oral BP sitting:   124 / 82  (left arm) Cuff size:   large  Vitals Entered By: Alfred Levins, CMA (October 13, 2009 11:18 AM) CC: hurt his back picking up his daughter on Thursday, IBF works but if he misses a day he is in alot of pain   History of Present Illness: Here for some continued pains and stiffness in the upper back since an injury on 10-08-09. When bending over to pick up his child he felt a pulling and a pain in the upper back between the shoulder blades. Now it persists although heat and Motrin help temporarily. No pain or numbness in the arms or hands.   Current Medications (verified): 1)  Tramadol Hcl 50 Mg  Tabs (Tramadol Hcl) .... Once Daily As Needed 2)  Imitrex 100 Mg Tabs (Sumatriptan Succinate) .... Once Daily As Needed 3)  Indomethacin Cr 75 Mg Cr-Caps (Indomethacin) .... Take 1 Tablet By Mouth Two Times A Day As Needed Gout Flare 4)  Omeprazole 20 Mg Cpdr (Omeprazole) .... One By Mouth Daily  Allergies (verified): No Known Drug Allergies  Past History:  Past Medical History: Reviewed history from 07/22/2008 and no changes required. 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 arthroscopic L and R knees R cartilage tear migraine headache  Review of Systems  The patient denies anorexia, fever, weight loss, weight gain, vision loss, decreased hearing, hoarseness, chest pain, syncope, dyspnea on exertion, peripheral edema, prolonged cough, headaches, hemoptysis, abdominal pain, melena, hematochezia, severe indigestion/heartburn, hematuria, incontinence, genital sores, muscle weakness,  suspicious skin lesions, transient blindness, difficulty walking, depression, unusual weight change, abnormal bleeding, enlarged lymph nodes, angioedema, breast masses, and testicular masses.    Physical Exam  General:  Well-developed,well-nourished,in no acute distress; alert,appropriate and cooperative throughout examination Msk:  tender in the upper back between the shoulder blades with some spasm but full ROM   Impression & Recommendations:  Problem # 1:  THORACIC SPRAIN AND STRAIN (ICD-847.1)  Complete Medication List: 1)  Tramadol Hcl 50 Mg Tabs (Tramadol hcl) .... Once daily as needed 2)  Imitrex 100 Mg Tabs (Sumatriptan succinate) .... Once daily as needed 3)  Indomethacin Cr 75 Mg Cr-caps (Indomethacin) .... Take 1 tablet by mouth two times a day as needed gout flare 4)  Omeprazole 20 Mg Cpdr (Omeprazole) .... One by mouth daily 5)  Flexeril 10 Mg Tabs (Cyclobenzaprine hcl) .... Three times a day as needed spasm 6)  Diclofenac Sodium 50 Mg Tbec (Diclofenac sodium) .... Three times a day as needed pain  Patient Instructions: 1)  Please schedule a follow-up appointment as needed .  Prescriptions: DICLOFENAC SODIUM 50 MG TBEC (DICLOFENAC SODIUM) three times a day as needed pain  #60 x 5   Entered and Authorized by:   Nelwyn Salisbury MD   Signed by:   Nelwyn Salisbury MD on 10/13/2009   Method used:   [facility] to        [facility]  [phone]* (retail)       [address] [place], [place]  [address]       Ph:  [phone] or [phone]       Fax: [phone]   RxID:   [id number] FLEXERIL 10 MG TABS (CYCLOBENZAPRINE HCL) three times a day as needed spasm  #60 x 5   Entered and Authorized by:   Nelwyn Salisbury MD   Signed by:   Nelwyn Salisbury MD on 10/13/2009   Method used:   [facility] to        [facility]  [phone]* (retail)       [address] [place], [place]  [address]       Ph: [phone] or [phone]       Fax: [phone]   RxID:    [id number]

## 2010-10-19 NOTE — Assessment & Plan Note (Signed)
Summary: F/U ABD pain, review CT,constipation saw PA   History of Present Illness Visit Type: Follow-up Visit Primary GI MD: Stan Head MD [facility] Primary Provider: Birdie Sons, MD Requesting Provider: Birdie Sons, MD Chief Complaint: F/U ABD pain, CT pain and bloating persist, nausea better History of Present Illness:   His problems began with constipation 1 week before Mother's Day and then after camping and wife's illness with GI problems, he developed problems. He had nausea and omiting wth diarhea and then rectal bleeding. He saw [place] (Amy Esterwood, PA-C) and a CT showed gallstones but abd/pelvis otherwise ok.Has some days where he feels ok but then has recurrent cramps and diarrhea. No bleeding. also has tenesmus at times but no stool. Has not been hungry. Dicyclomine is not helping.   GI Review of Systems    Reports abdominal pain and  bloating.     Location of  Abdominal pain: lower abdomen.    Denies acid reflux, belching, chest pain, dysphagia with liquids, dysphagia with solids, heartburn, loss of appetite, nausea, vomiting, vomiting blood, weight loss, and  weight gain.        Denies anal fissure, black tarry stools, change in bowel habit, constipation, diarrhea, diverticulosis, fecal incontinence, heme positive stool, hemorrhoids, irritable bowel syndrome, jaundice, light color stool, liver problems, rectal bleeding, and  rectal pain.    Current Medications (verified): 1)  Imitrex 100 Mg Tabs (Sumatriptan Succinate) .... Once Daily As Needed 2)  Omeprazole 20 Mg Cpdr (Omeprazole) .... One By Mouth Daily As Needed Use Withnsaid. 3)  Miralax   Powd (Polyethylene Glycol 3350) .Marland Kitchen.. 17g By Mouth Once Daily As Needed Constipation 4)  Excedrin Tension Headache 500-65 Mg Tabs (Acetaminophen-Caffeine) .... Take One By Mouth As Needed 5)  Bentyl 10 Mg Caps (Dicyclomine Hcl) .... Take 1 Tab 3 Times Daily As Needed  For Pain and Spasms 6)  Promethazine Hcl 25 Mg Tabs (Promethazine Hcl)  .... Take 1/2 To 1 Tab Every 6 Hours As Needed For Nausea 7)  Prilosec Otc 20 Mg Tbec (Omeprazole Magnesium) .... Take 1 Daily, 30 Min Prior To Breakfast  For 14 Days 8)  Analpram E 2.5-1 & 1 % Kit (Hydrocortisone Ace-Pramoxine) .... Use Rectally  Allergies (verified): No Known Drug Allergies  Past History:  Past Medical History: Reviewed history from 02/01/2010 and no changes required. 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 arthroscopic L and R knees  Family History: Reviewed history from 02/01/2010 and no changes required. Family History of Prostate Cancer: father No FH of Colon Cancer Family History of Diabetes: sister  Social History: Reviewed history from 02/01/2010 and no changes required. 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 Eyes:  anicteric Abdomen:  soft, NT Psych:  Alert  and cooperative. Normal mood and affect.   Impression & Recommendations:  Problem # 1:  DIARRHEA OF PRESUMED INFECTIOUS ORIGIN (ICD-009.3) Assessment Improved I think he had an infectious GI illness and has some post-infectious IBS preceding constipation is noted but other trhan that he has had an acute illness and is overal beter but not resolved. Given labs (ok) and CT (ok) would  not pursue colonoscopy now but if fails to resolve/worsens may need that he understands plan and will folow-up in a month  Problem # 2:  ABDOMINAL PAIN -GENERALIZED (ICD-789.07) Assessment: Improved  Problem # 3:  NAUSEA AND VOMITING (ICD-787.01) Assessment: Improved no vomiting, only nausea which is better  Problem # 4:  RECTAL BLEEDING (ICD-569.3) Assessment: Improved resolved and was associated with diarrhea and seems to have been anorectal and/or related to diarrheal illness  Patient Instructions: 1)  Take 2 dicyclomine at a time 2)  Align is a new therapy 1 every day for 1 month 3)  Please schedule a follow-up appointment in 1 month. Call back sooner as needed. 4)  Advised to stick with a low residue diet  avoiding food that can irritate bowel (see handout).  5)  The medication list was reviewed and reconciled.  All changed / newly prescribed medications were explained.  A complete medication list was provided to the patient / caregiver.

## 2010-10-19 NOTE — Letter (Signed)
Summary: Records from [facility] Consult & CT Reports 03/2  Records from [facility] Consult & CT Reports 12/07/06 thru 02/07/07   Imported By: Maryln Gottron 11/06/2008 13:24:36  _____________________________________________________________________  External Attachment:    Type:   Image     Comment:   External Document

## 2010-10-19 NOTE — Progress Notes (Signed)
Summary: LMTCB 10-28  Phone Note Call from Patient Call back at [phone]   Caller: vm Call For: Adil Tugwell Summary of Call: In hospital this past weekend vomiting blood.  LB physician said not to take indomethacin this week if gout flareup & to call Dr. Cato Mulligan.  Having flareup now & need alternative med. Initial call taken by: Rudy Jew, RN,  July 16, 2008 8:58 AM  Follow-up for Phone Call        colchicine 0.6 mg by mouth three times a day today and two times a day tomorrow.  # 10/1 refill Follow-up by: Birdie Sons MD,  July 16, 2008 11:42 AM  Additional Follow-up for Phone Call Additional follow up Details #1::        LMTCB Rudy Jew, RN  July 16, 2008 1:56 PM     New/Updated Medications: COLCHICINE 0.6 MG TABS (COLCHICINE) one three times a day today & one two times a day tomorrow   Prescriptions: COLCHICINE 0.6 MG TABS (COLCHICINE) one three times a day today & one two times a day tomorrow  #10 x 1   Entered by:   Lynann Beaver CMA   Authorized by:   Birdie Sons MD   Signed by:   Lynann Beaver CMA on 07/16/2008   Method used:   [facility] to        [facility]  [phone]* (retail)       [address] [place], [place]  [address]       Ph: [phone] or [phone]       Fax: [phone]   RxID:   [id number] COLCHICINE 0.6 MG TABS (COLCHICINE) one three times a day today & one two times a day tomorrow  #10 x 1   Entered by:   Rudy Jew, RN   Authorized by:   Birdie Sons MD   Signed by:   Rudy Jew, RN on 07/16/2008   Method used:   Print then Give to Patient   RxID:   [id number]  Pt notified.

## 2010-10-19 NOTE — Assessment & Plan Note (Signed)
Summary: rectal bleeding/njr   Vital Signs:  Patient profile:   45 year old male Weight:      218 pounds BMI:     31.39 Temp:     98.4 degrees F oral Pulse rate:   60 / minute Pulse rhythm:   regular Resp:     12 per minute BP sitting:   120 / 88  (left arm) Cuff size:   regular  Vitals Entered By: Gladis Riffle, RN (Jan 29, 2010 9:18 AM) CC: c/o rectal bleeding at times from hemmorhois, also having constipation and no appetite but vomited last nights food--bloating and weight gain Is Patient Diabetic? No   CC:  c/o rectal bleeding at times from hemmorhois and also having constipation and no appetite but vomited last nights food--bloating and weight gain.  History of Present Illness: 2 weeks ago had hemorrhoid---used creams and sxs have resolved  he then developed constipation 2 weeks ago. Yesterday had sensation that he had to defacate. Had a BM---mostly blood. No recurrence of sxs.  Different problem---last night after eating 2 chicken nuggets---had emesis. 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 Once Daily As Needed 2)  Omeprazole 20 Mg Cpdr (Omeprazole) .... One By Mouth Daily As Needed Use Withnsaid.  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 Gilbert's Cholelithiasis Gout cerebral aneurysms---has regular followup-has had angiogram Headache GI bleed--thought secondary to [employer] tear  Past Surgical History: Last updated: 06/12/2007 arthroscopic  L and R knees R cartilage tear migraine headache  Family History: Last updated: 08/06/2008 Family History of Prostate Cancer: father No FH of Colon Cancer:  Social History: Last updated: 08/06/2008 recently moved back To [place] Married 2 sons, 1 daughter Never Smoked Alcohol Use - yes 2-3/week Daily Caffeine Use Occupation: Clinical research associate Illicit Drug Use - no  Risk Factors: Alcohol Use: <1 (01/29/2010) Caffeine Use: 1 (08/06/2008)  Risk Factors: Smoking Status: never (01/29/2010)  Physical Exam  General:  alert and well-developed.   Head:  normocephalic and atraumatic.   Eyes:  pupils equal and pupils round.   Ears:  R ear normal and L ear normal.   Neck:  No deformities, masses, or tenderness noted. Rectal:  no external abnormalities, no hemorrhoids (tag only), and normal sphincter tone.   Msk:  No deformity or scoliosis noted of thoracic or lumbar spine.   Neurologic:  cranial nerves II-XII intact and gait normal.   Skin:  turgor normal and color normal.     Impression & Recommendations:  Problem # 1:  RECTAL BLEEDING (ICD-569.3)  likely hemorrhoidal will ask GI if colonoscopy reasonable---i suspect so check labs add miralax temporarily Orders: Venipuncture (16109) TLB-CBC Platelet - w/Differential (85025-CBCD) TLB-PT (Protime) (85610-PTP) TLB-PTT (85730-PTTL) Gastroenterology Referral (GI)  Problem # 2:  VOMITING (ICD-787.03) suspect food related check lfts  Complete Medication List: 1)  Imitrex 100 Mg Tabs (Sumatriptan succinate) .... Once daily as needed 2)  Omeprazole 20 Mg Cpdr (Omeprazole) .... One by mouth daily as needed use withnsaid. 3)  Miralax Powd (Polyethylene glycol 3350) .Marland KitchenMarland KitchenMarland Kitchen  17g by mouth once daily as needed constipation  Other Orders: TLB-Hepatic/Liver Function Pnl (80076-HEPATIC)  Patient Instructions: 1)  Please schedule a follow-up appointment in 1 month. discuss meds Prescriptions: MIRALAX   POWD (POLYETHYLENE GLYCOL 3350) 17g by  mouth once daily as needed constipation  #1 container x 1   Entered and Authorized by:   Birdie Sons MD   Signed by:   Birdie Sons MD on 01/29/2010   Method used:   [facility] to        [facility]  [phone]* (retail)       [address] [place], [place]  [address]       Ph: [phone] or [phone]       Fax: [phone]   RxID:   [id number]

## 2010-10-19 NOTE — Letter (Signed)
Summary: hemtemesis,anemia/[facility]  hemtemesis,anemia/[facility]   Imported By: Lester Chatham 07/22/2008 14:40:20  _____________________________________________________________________  External Attachment:    Type:   Image     Comment:   External Document

## 2010-10-19 NOTE — Procedures (Signed)
Summary: EGD/dilatation   EGD  Procedure date:  09/16/2008  Findings:      Location: [facility]      Appended Document: EGD    EGD  Procedure date:  09/16/2008  Findings:      Location: [facility]    ENDOSCOPY PROCEDURE REPORT  PATIENT:  John Costa, John Costa  MR#:  [id number] BIRTHDATE:   07-May-1966   GENDER:   male  ENDOSCOPIST:   Iva Boop, MD, [facility] ASSISTANT:    PROCEDURE DATE:  09/16/2008 PROCEDURE:  EGD, diagnostic, Elease Hashimoto Dilation of the Esophagus ASA CLASS:   Class I INDICATIONS: 1) hematemesis  2) dysphagia   MEDICATIONS:    Fentanyl 50 mcg IV, Versed 7 mg IV TOPICAL ANESTHETIC:   Exactacain Spray  DESCRIPTION OF PROCEDURE:   After the risks benefits and alternatives of the procedure were thoroughly explained, informed consent was obtained.  The LB GIF-H180 [id number] endoscope was introduced through the mouth and advanced to the second portion of the duodenum, without limitations.  The instrument was slowly withdrawn as the mucosa was carefully examined. <<PROCEDUREIMAGES>>    <<OLD IMAGES>>  The upper, middle, and distal third of the esophagus were carefully inspected and no abnormalities were noted. The z-line was well seen at the GEJ. The endoscope was pushed into the fundus which was normal including a retroflexed view. The antrum, first and second part of the duodenum were unremarkable.    Dilation was then performed at the distal esophagus  1) Dilator:   Elease Hashimoto   Size(s):   54 French Resistance:   minimal   Heme:   none     COMPLICATIONS:   None         ENDOSCOPIC IMPRESSION:  1) Normal EGD 2) 54 Fr Maloney Dilator passed due to complaints of dysphagia. RECOMMENDATIONS:  Stop Protonox by taking 1 every other day for 2 weeks then none If swallowing problems persist or you have heartburn or other stomach problems return to Dr. Leone Payor. If you have to take indomethacin or other anti-inflammatories regulary  (for more than a week) then take Prilosec OTC (or Protonix) daily while on those anti-inflammatory medications.  REPEAT EXAM:   No    Iva Boop, MD, [facility] Graham  CC: Birdie Sons, MD        The Patient

## 2010-10-19 NOTE — Progress Notes (Signed)
Summary: wants ov  Phone Note Call from Patient Call back at Work Phone [phone]   Caller: Patient---live call Summary of Call: wantsov today. having back pain. please return call. Initial call taken by: Warnell Forester,  March 29, 2010 8:38 AM  Follow-up for Phone Call        appt scheduled tomorrow with Dr. Cato Mulligan. Follow-up by: Lynann Beaver CMA,  March 29, 2010 9:30 AM

## 2010-10-19 NOTE — Progress Notes (Signed)
Summary: Vomitted last night food  Phone Note From Other Clinic   Caller: Aurther Loft @ Dr Cato Mulligan  [phone] Call For: Dr Leone Payor Reason for Call: Schedule Patient Appt Summary of Call: Rectal Bleed from hemorroids at times.  Constipation with no appetite. Vomitted last nights food. Bloating and weight gain. Initial call taken by: Leanor Kail [facility],  Jan 29, 2010 10:19 AM  Follow-up for Phone Call        Left message for patient to call back Darcey Nora RN, [facility]  Jan 29, 2010 11:17 AM  patient with lack of appetite, vomiting, rectal bleeding and constipation.  Patient  will come in and see Mike Gip PA 02/01/10 8:30 Follow-up by: Darcey Nora RN, CGRN,  Jan 29, 2010 11:28 AM

## 2010-10-19 NOTE — Assessment & Plan Note (Signed)
Summary: rash/jls   Vital Signs:  Patient Profile:   45 Years Old Male Weight:      216 pounds Temp:     98.3 degrees F Pulse rate:   76 / minute BP sitting:   114 / 90  (left arm)  Vitals Entered By: Gladis Riffle, RN (Jan 21, 2008 3:04 PM)                 Chief Complaint:  had moved to [facility] and has returned--c/o rash torso, arms, neck, genitals, and and back x 7 weeks--also right ear pain.  History of Present Illness: Rash---2 months. started as a slight rash---trunk and arms. no sxs, no itch or pain, no new exposures.  He had a similar rash 2 years ago in [place].  in addition he complains of headache. Intermittent headaches. Relieved with Imitrex and other pain medications. May be related to previous cerebral aneurysm.  Cerebral aneurysm: Patient has regular followup and angiography planned.      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 No apparent alopecia or balding. Eyes:     No corneal or conjunctival inflammation noted. EOMI. Perrla. Funduscopic exam benign, without hemorrhages, exudates or papilledema. Vision grossly normal. Ears:     External ear exam shows no significant lesions or deformities.  Otoscopic examination reveals clear canals, tympanic membranes are intact bilaterally without bulging, retraction, inflammation or discharge. Hearing is grossly normal bilaterally. Neck:     No  deformities, masses, or tenderness noted. Chest Wall:     No deformities, masses, tenderness or gynecomastia noted. Lungs:     Normal respiratory effort, chest expands symmetrically. Lungs are clear to auscultation, no crackles or wheezes. Skin:     flat, slightly hyperpigmented rash over torso and upper arms.    Impression & Recommendations:  Problem # 1:  PITYRIASIS ROSEA (ICD-696.3) discussed natural history  Problem # 2:  CEREBRAL ANEURYSM (ICD-437.3) has regular follow up rare pain--uses rare vicodin 7.5 we will refill uses alprazolam prn  Problem # 3:  HEADACHE (ICD-784.0) discussed. Refill Imitrex. His updated medication list for this problem includes:    Hydrocodone-acetaminophen 5-500 Mg Tabs (Hydrocodone-acetaminophen)    Tramadol Hcl 50 Mg Tabs (Tramadol hcl) ..... Once daily as needed    Imitrex 100 Mg Tabs (Sumatriptan succinate) ..... Once daily as needed   Problem # 4:  GOUT (ICD-274.9) no recurrence.  Complete Medication List: 1)  Topamax 50 Mg Tabs (Topiramate) .Marland Kitchen.. 1 by mouth two times a day.qhs 2)  Plavix 75 Mg Tabs (Clopidogrel bisulfate) .... Take 1 tab by mouth daily 3)  Hydrocodone-acetaminophen 5-500 Mg Tabs (Hydrocodone-acetaminophen) 4)  Tramadol Hcl 50 Mg Tabs (Tramadol hcl) .... Once daily as needed 5)  Alprazolam 0.5 Mg Tabs (Alprazolam) .... Once daily as needed for pain 6)  Imitrex 100 Mg Tabs (Sumatriptan succinate) .Marland KitchenMarland KitchenMarland Kitchen  Once daily as needed     Prescriptions: FLOXIN OTIC 0.3 %  SOLN (OFLOXACIN) 3 rops right ear three times a day for 5 days  #1 vial x 0   Entered and Authorized by:   Birdie Sons MD   Signed by:   Birdie Sons MD on 01/21/2008   Method used:   Print then Give to Patient   RxID:   [id number] IMITREX 100 MG TABS (SUMATRIPTAN SUCCINATE) once daily as needed  #9 x 1   Entered and Authorized by:   Birdie Sons MD   Signed by:   Birdie Sons MD on 01/21/2008   Method used:   Print then Give to Patient   RxID:    [id number] ALPRAZOLAM 0.5 MG  TABS (ALPRAZOLAM) once daily as needed for pain  #20 x 1   Entered and Authorized by:   Birdie Sons MD   Signed by:   Birdie Sons MD on 01/21/2008   Method used:   Print then Give to Patient   RxID:   [id number]  ]

## 2010-10-21 NOTE — Assessment & Plan Note (Signed)
Summary: cough/njr   Vital Signs:  Patient profile:   45 year old male Weight:      228 pounds Temp:     98.4 degrees F oral Pulse rate:   92 / minute Pulse rhythm:   regular BP sitting:   138 / 94  (left arm) Cuff size:   regular  Vitals Entered By: Kyung Rudd, CMA (October 12, 2010 2:36 PM) CC: pt c/o cough and low grade fever x 2 wks   Primary Care Provider:  Birdie Sons, MD  CC:  pt c/o cough and low grade fever x 2 wks.  History of Present Illness: Patient presents to clinic as a work in for evaluation of cough. approximate two-week history of cough now productive for yellow sputum. no alleviating or exacerbating factors. Has had persistent low-grade temperatures approximately maximum of 100. Positive sick exposure with family members. Taking over-the-counter medication without success. Recently has noted some possible subjective wheezing. Blood pressure minimally elevated  with no history of hypertension. Also notes chronic intermittent headaches related to previous diagnosis of cerebral aneurysm. Has no neurologic deficits. Request refill of tramadol which helps headaches without sedation.  no other complaints  Current Medications (verified): 1)  Imitrex 100 Mg Tabs (Sumatriptan Succinate) .... Once Daily As Needed 2)  Omeprazole 20 Mg Cpdr (Omeprazole) .... One By Mouth Daily As Needed Use Withnsaid. 3)  Excedrin Tension Headache 500-65 Mg Tabs (Acetaminophen-Caffeine) .... Take One By Mouth As Needed 4)  Motrin Ib 200 Mg Tabs (Ibuprofen) .... Take 4 Tabs Two Times A Day 5)  Tramadol Hcl 50 Mg Tabs (Tramadol Hcl) .... As Needed  Allergies (verified): No Known Drug Allergies  Past History:  Past medical, surgical, family and social histories (including risk factors) reviewed for relevance to current acute and chronic problems.  Past Medical History: Reviewed history from 02/01/2010 and no changes required. "pelvic kidney"==rare  hematuria Gilbert's Cholelithiasis Gout cerebral aneurysms---has regular followup-has had angiogram MIGRAINES GI bleed--thought secondary to [employer] tear 2009  Past Surgical History: Reviewed history from 02/01/2010 and no changes required. arthroscopic L and R knees  Family History: Reviewed history from 02/01/2010 and no changes required. Family History of Prostate Cancer: father No FH of Colon Cancer Family History of Diabetes: sister  Social History: Reviewed history from 02/01/2010 and no changes required. recently moved back To [place] Married 2 sons, 1 daughter Never Smoked Alcohol Use - yes 2-3/week Daily Caffeine Use- 1 per day coffee Occupation: Clinical research associate Illicit Drug Use - no  Review of Systems      See HPI  Physical Exam  General:  Well-developed,well-nourished,in no acute distress; alert,appropriate and cooperative throughout examination Head:  Normocephalic and atraumatic without obvious abnormalities. No apparent alopecia or balding. Eyes:  pupils equal, pupils round, corneas and lenses clear, and no injection.   Ears:  External ear exam shows no significant lesions or deformities.  Otoscopic examination reveals clear canals, tympanic membranes are intact bilaterally without bulging, retraction, inflammation or discharge. Hearing is grossly normal bilaterally. Nose:  External nasal examination shows no deformity or inflammation. Nasal mucosa are pink and moist without lesions or exudates. Mouth:  Oral mucosa and oropharynx without lesions or exudates.  Teeth in good repair. Neck:  No deformities, masses, or tenderness noted. Lungs:  Normal respiratory effort, chest expands symmetrically. Lungs are clear to auscultation, no crackles or wheezes.no intercostal retractions and no accessory muscle use.   Neurologic:  alert & oriented X3 and gait normal.   Skin:  turgor normal,  color normal, and no rashes.     Impression & Recommendations:  Problem # 1:  ACUTE  BRONCHITIS (ICD-466.0) Assessment New  persistent symptoms. Begin antibiotic therapy. Followup improvement or worsening. His updated medication list for this problem includes:    Doxycycline Hyclate 100 Mg Tabs (Doxycycline hyclate) ..... One by mouth two times a day  Problem # 2:  HEADACHE (ICD-784.0) Assessment: Unchanged  currently  asymptomatic. Neurologically nonfocal. refill tramadol prn pain.Marland Kitchen His updated medication list for this problem includes:    Imitrex 100 Mg Tabs (Sumatriptan succinate) ..... Once daily as needed    Excedrin Tension Headache 500-65 Mg Tabs (Acetaminophen-caffeine) .Marland Kitchen... Take one by mouth as needed    Motrin Ib 200 Mg Tabs (Ibuprofen) .Marland Kitchen... Take 4 tabs two times a day    Tramadol Hcl 50 Mg Tabs (Tramadol hcl) ..... One by mouth q4-6 hours as needed pain  Problem # 3:  ELEVATED BLOOD PRESSURE WITHOUT DIAGNOSIS OF HYPERTENSION (ICD-796.2) Assessment: New Asx. likely contribution from acute illness. Recommend outpatient blood pressure log  Complete Medication List: 1)  Imitrex 100 Mg Tabs (Sumatriptan succinate) .... Once daily as needed 2)  Omeprazole 20 Mg Cpdr (Omeprazole) .... One by mouth daily as needed use withnsaid. 3)  Excedrin Tension Headache 500-65 Mg Tabs (Acetaminophen-caffeine) .... Take one by mouth as needed 4)  Motrin Ib 200 Mg Tabs (Ibuprofen) .... Take 4 tabs two times a day 5)  Tramadol Hcl 50 Mg Tabs (Tramadol hcl) .... One by mouth q4-6 hours as needed pain 6)  Doxycycline Hyclate 100 Mg Tabs (Doxycycline hyclate) .... One by mouth two times a day Prescriptions: DOXYCYCLINE HYCLATE 100 MG TABS (DOXYCYCLINE HYCLATE) one by mouth two times a day  #14 x 0   Entered and Authorized by:   Edwyna Perfect MD   Signed by:   Edwyna Perfect MD on 10/12/2010   Method used:   [facility] to        [facility]  [phone]* (retail)       [address] [place], [place]  [address]       Ph: [phone] or [phone]       Fax:  [phone]   RxID:   [id number] TRAMADOL HCL 50 MG TABS (TRAMADOL HCL) one by mouth q4-6 hours as needed pain  #30 x 2   Entered and Authorized by:   Edwyna Perfect MD   Signed by:   Edwyna Perfect MD on 10/12/2010   Method used:   [facility] to        [facility]  [phone]* (retail)       [address] [place], [place]  [address]       Ph: [phone] or [phone]       Fax: [phone]   RxID:   [id number]    Orders Added: 1)  Est. Patient Level IV [42706]

## 2010-10-22 NOTE — Consult Note (Signed)
Summary: h/p exam  h/p exam   Imported By: Kassie Mends 10/30/2007 09:58:55  _____________________________________________________________________  External Attachment:    Type:   Image     Comment:   h/p exam

## 2010-11-24 ENCOUNTER — Emergency Department (HOSPITAL_COMMUNITY)
Admission: EM | Admit: 2010-11-24 | Discharge: 2010-11-25 | Disposition: A | Discharge status: Other Acute Inpatient Hospital | Payer: BC Managed Care – PPO | Source: Home / Self Care

## 2010-11-24 ENCOUNTER — Encounter (HOSPITAL_COMMUNITY): Payer: Self-pay

## 2010-11-24 ENCOUNTER — Emergency Department (HOSPITAL_COMMUNITY): Payer: BC Managed Care – PPO

## 2010-11-24 DIAGNOSIS — R109 Unspecified abdominal pain: Secondary | ICD-10-CM | POA: Insufficient documentation

## 2010-11-24 DIAGNOSIS — N200 Calculus of kidney: Secondary | ICD-10-CM | POA: Insufficient documentation

## 2010-11-24 DIAGNOSIS — M545 Low back pain, unspecified: Secondary | ICD-10-CM | POA: Insufficient documentation

## 2010-11-24 DIAGNOSIS — N509 Disorder of male genital organs, unspecified: Secondary | ICD-10-CM | POA: Insufficient documentation

## 2010-11-24 DIAGNOSIS — R300 Dysuria: Secondary | ICD-10-CM | POA: Insufficient documentation

## 2010-11-24 HISTORY — DX: Cerebral aneurysm, nonruptured: I67.1

## 2010-11-24 HISTORY — DX: Ectopic kidney: Q63.2

## 2010-11-24 LAB — POCT I-STAT, CHEM 8
BUN: 15 mg/dL (ref 6–23)
Calcium, Ion: 1.22 mmol/L (ref 1.12–1.32)
Chloride: 104 mEq/L (ref 96–112)
Creatinine, Ser: 1.3 mg/dL (ref 0.4–1.5)
Glucose, Bld: 90 mg/dL (ref 70–99)
HCT: 44 % (ref 39.0–52.0)
Hemoglobin: 15 g/dL (ref 13.0–17.0)
Potassium: 4.5 mEq/L (ref 3.5–5.1)
Sodium: 142 mEq/L (ref 135–145)
TCO2: 28 mmol/L (ref 0–100)

## 2010-11-25 ENCOUNTER — Observation Stay (HOSPITAL_COMMUNITY)
Admission: AD | Admit: 2010-11-25 | Discharge: 2010-11-26 | Disposition: A | Discharge status: Home / Self Care | Payer: BC Managed Care – PPO | Source: Ambulatory Visit | Attending: Urology | Admitting: Urology

## 2010-11-25 DIAGNOSIS — R112 Nausea with vomiting, unspecified: Secondary | ICD-10-CM | POA: Insufficient documentation

## 2010-11-25 DIAGNOSIS — R310 Gross hematuria: Secondary | ICD-10-CM | POA: Insufficient documentation

## 2010-11-25 DIAGNOSIS — N200 Calculus of kidney: Principal | ICD-10-CM | POA: Insufficient documentation

## 2010-11-25 DIAGNOSIS — N201 Calculus of ureter: Secondary | ICD-10-CM | POA: Insufficient documentation

## 2010-11-25 LAB — URINALYSIS, MICROSCOPIC ONLY
Bilirubin Urine: NEGATIVE
Glucose, UA: NEGATIVE mg/dL
Leukocytes, UA: NEGATIVE
Nitrite: NEGATIVE
Protein, ur: NEGATIVE mg/dL
Specific Gravity, Urine: 1.027 (ref 1.005–1.030)
Urobilinogen, UA: 0.2 mg/dL (ref 0.0–1.0)
pH: 5.5 (ref 5.0–8.0)

## 2010-11-25 LAB — CBC
HCT: 39 % (ref 39.0–52.0)
Hemoglobin: 12.7 g/dL — ABNORMAL LOW (ref 13.0–17.0)
MCH: 28 pg (ref 26.0–34.0)
MCHC: 32.6 g/dL (ref 30.0–36.0)
MCV: 86.1 fL (ref 78.0–100.0)
Platelets: 191 10*3/uL (ref 150–400)
RBC: 4.53 MIL/uL (ref 4.22–5.81)
RDW: 15 % (ref 11.5–15.5)
WBC: 7.4 10*3/uL (ref 4.0–10.5)

## 2010-11-25 LAB — BASIC METABOLIC PANEL
BUN: 13 mg/dL (ref 6–23)
CO2: 30 mEq/L (ref 19–32)
Calcium: 8.7 mg/dL (ref 8.4–10.5)
Chloride: 106 mEq/L (ref 96–112)
Creatinine, Ser: 1.09 mg/dL (ref 0.4–1.5)
GFR calc Af Amer: >60 mL/min (ref >60)
GFR calc non Af Amer: >60 mL/min (ref >60)
Glucose, Bld: 101 mg/dL — ABNORMAL HIGH (ref 70–99)
Potassium: 3.9 mEq/L (ref 3.5–5.1)
Sodium: 140 mEq/L (ref 135–145)

## 2010-11-25 LAB — MRSA PCR SCREENING: MRSA by PCR: NEGATIVE

## 2010-11-25 LAB — SURGICAL PCR SCREEN
MRSA, PCR: NEGATIVE
Staphylococcus aureus: POSITIVE — AB

## 2010-11-26 NOTE — Op Note (Signed)
NAMEADIN, LAKER NO.:  [id number]  MEDICAL RECORD NO.:  [id number]           PATIENT TYPE:  I  LOCATION:  1439                         FACILITY:  [facility]  PHYSICIAN:  Heloise Purpura, MD      DATE OF BIRTH:  June 14, 1966  DATE OF PROCEDURE:  11/25/2010 DATE OF DISCHARGE:                              OPERATIVE REPORT   PREOPERATIVE DIAGNOSES: 1. Left ureteropelvic junction calculus. 2. Left renal calculus.  POSTOPERATIVE DIAGNOSIS:  Left renal calculi.  PROCEDURES: 1. Cystoscopy. 2. Left retrograde pyelography with interpretation. 3. Left ureteroscopy. 4. Left ureteral stent placement (5 x 24).  SURGEON:  Heloise Purpura, MD  ANESTHESIA:  General.  COMPLICATIONS:  None.  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  There was a large filling defect in the lower pole of the kidney consistent with the patient's known calculus.  The calculus was no longer at the level of the ureteropelvic junction.  INDICATION:  Mr. John Costa is a 45 year old gentleman who presented with acute left-sided flank pain yesterday and was found to have an 11-mm left UPJ calculus and a left pelvic kidney.  He was admitted to the hospital for IV pain control.  After discussing management options for treatment, he elected to proceed with definitive ureteroscopic laser lithotripsy if feasible.  We did discuss the potential risks, complications, and alternative treatment options associated with this procedure and informed consent was obtained.  DESCRIPTION OF PROCEDURE:  The patient was taken to the operating room and a general anesthetic was administered.  He was given preoperative antibiotics, placed in the dorsal lithotomy position, and prepped and draped in the usual sterile fashion.  Next, a preoperative time-out was performed.  Cystourethroscopy was performed which  revealed a normal anterior and posterior urethra.  Inspection of the bladder systematically revealed no evidence of any bladder tumors, stones, or other mucosal pathology.  His ureteral orifices were located in their normal anatomic position.  A 6-French ureteral catheter was then used to intubate the distal left ureter and Omnipaque contrast was injected which revealed findings as dictated above.  He was noted to have a centrally located pelvic kidney with a large filling defect in the lower pole consistent with his known stone.  A 0.038 sensor guidewire was then advanced up into the left renal pelvis under fluoroscopic guidance.  A 12/14 Gyrus access sheath was then advanced over the wire up into the proximal ureter without difficulty.  The flexible digital ureteroscope was then advanced through the access sheath up to the level of the proximal ureter.  However, the ureteroscope was unable to be passed proximally into the renal pelvis due to the narrowed caliber of the ureteropelvic junction.  Although no clear obstruction was identified, there was noted to be such a narrow caliber of the proximal ureter so as not to allow the scope to pass easily.  It was, therefore, felt that he should undergo ureteral stent placement to relieve his pain and for passive dilation with possible subsequent definitive therapy in the next couple of weeks.  Therefore, the ureteral access  sheath was removed and the wire was left in the left renal pelvis.  The wire was back loaded on the cystoscope and a 5 x 24 Polaris ureteral stent was advanced over the wire using Seldinger technique.  It was positioned appropriately under fluoroscopic and cystoscopic guidance with a good curl noted in the renal pelvis and the distal end of the stent located appropriately within the pelvis.  Due to the fact that he did have a pelvic kidney and a shortened ureter, there was a longer portion of the distal stent than typical  in the bladder.  However, no shorter stents were available in the operating room.  The patient tolerated the procedure well and without complications.  He was able to be awakened and transferred to the recovery unit in satisfactory condition.  PLAN:  He will be scheduled to undergo definitive ureteroscopic laser lithotripsy in the future following passive stent dilation assuming that he continues to wish to proceed in this fashion versus shock wave lithotripsy.     Heloise Purpura, MD     LB/MEDQ  D:  11/25/2010  T:  11/26/2010  Job:  [id number]  Electronically Signed by Heloise Purpura MD on 11/26/2010 11:11:34 PM

## 2010-11-26 NOTE — Consult Note (Signed)
John Costa, John Costa NO.:  [id number]  MEDICAL RECORD NO.:  [id number]           PATIENT TYPE:  E  LOCATION:  [facility]                         FACILITY:  [facility]  PHYSICIAN:  Heloise Purpura, MD      DATE OF BIRTH:  1965-12-02  DATE OF CONSULTATION:  11/24/2010 DATE OF DISCHARGE:  11/25/2010                                CONSULTATION   REASON FOR CONSULTATION:  Kidney stone.  PHYSICIAN REQUESTING CONSULTATION:  Emeline General, MD  HISTORY:  John Costa is a 45 year old gentleman who developed the acute onset of severe left lower quadrant pain earlier this morning.  He presented to an urgent care center for further evaluation and had a KUB x-ray, which demonstrated a calcification in the left pelvis concerning for possible kidney stone.  He also had microscopic hematuria and had actually developed gross hematuria consistent with a potential kidney stone.  He was therefore sent to the [facility] Emergency Department where he underwent further evaluation with a CT scan of the abdomen and pelvis.  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  He has denied any fever or chills.  He has had some mild nausea and vomiting. His pain has been poorly controlled with IV pain medication in the emergency department.  PAST MEDICAL HISTORY:  History of bilateral carotid artery aneurysms, status post treatment.  PAST SURGICAL HISTORY:  Bilateral knee arthroscopy.  MEDICATIONS:  Tramadol p.r.n.  ALLERGIES:  No known drug allergies.  FAMILY HISTORY:  Both his brother and sister have a history of nephrolithiasis.  SOCIAL HISTORY:  He denies tobacco use.  He drinks alcohol only socially.  He is married and has 3 children.  REVIEW OF SYSTEMS:  A complete review of systems was performed. Pertinent negatives include no recent fever.  Pertinent positives include mild nausea and vomiting.  All other  systems are reviewed and are otherwise negative.  PHYSICAL EXAMINATION:  VITAL SIGNS:  Temperature 98.4, pulse 76, blood pressure 149/76. CONSTITUTIONAL:  Well-nourished, well-developed, age-appropriate male in no acute distress. HEENT:  Normocephalic, atraumatic. NECK:  Supple without lymphadenopathy. CARDIOVASCULAR:  Regular rate and rhythm without obvious murmurs. LUNGS:  Clear bilaterally. ABDOMEN:  There is mild tenderness to palpation over the left lower quadrant without evidence of rebound tenderness or guarding.  No flank pain.  No abdominal masses. GENITOURINARY:  Normal male phallus.  Testes are descended bilaterally and are nontender without masses. EXTREMITIES:  No edema. NEUROLOGIC:  Grossly intact.  LABORATORY DATA:  Although I do not have the result report, the patient has microscopic hematuria in his urinalysis at the Urgent Care Center. These records are not available for review, however.  His serum creatinine is 0.9.  I independently reviewed his CT scan, which demonstrates an 11-mm left UPJ calculus with a second nonobstructing left renal calculus.  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  IMPRESSION:  An 11-mm left ureteropelvic junction calculus in the pelvic kidney.  PLAN:  He will be admitted to the hospital for IV pain control and we have discussed options for definitive therapy including the possibility  of shockwave lithotripsy versus ureteroscopic laser lithotripsy or percutaneous approaches.  We discussed the pros and cons of these approaches and he is most interested in proceeding with definitive ureteroscopic laser lithotripsy considering the low likelihood for him passing a stone of this size.  We therefore reviewed the potential risks, complications, and alternative treatment options, and we will tentatively plan to proceed with treatment tomorrow.     Heloise Purpura, MD     LB/MEDQ  D:  11/24/2010  T:  11/25/2010  Job:  [id number]  Electronically  Signed by Heloise Purpura MD on 11/26/2010 11:06:21 PM

## 2010-11-26 NOTE — Discharge Summary (Signed)
  NAMEEDGER, HUSAIN NO.:  [id number]  MEDICAL RECORD NO.:  [id number]           PATIENT TYPE:  E  LOCATION:  [facility]                         FACILITY:  [facility]  PHYSICIAN:  Heloise Purpura, MD      DATE OF BIRTH:  Sep 15, 1966  DATE OF ADMISSION:  11/24/2010 DATE OF DISCHARGE:  11/26/2010                              DISCHARGE SUMMARY   ADMISSION DIAGNOSES: 1. Left pelvic kidney. 2. Left ureteropelvic junction calculus.  DISCHARGE DIAGNOSES: 1. Left pelvic kidney. 2. Left ureteropelvic junction calculus.  HISTORY AND PHYSICAL:  For full details, please see admission history and physical.  Briefly, Mr. Beitler is a 45 year old gentleman who developed severe left flank pain and was found to have an 11 mm left UPJ calculus in a left pelvic kidney.  His pain was unable to be controlled in the emergency department, therefore requiring admission.  HOSPITAL COURSE:  On November 24, 2010, the patient was evaluated in the emergency department and felt that he did need to be admitted to the hospital for IV pain control.  We discussed management options for treatment and he elected to proceed with definitive surgical therapy and ureteroscopic laser lithotripsy.  Therefore, he was taken to the operating room on the afternoon of November 25, 2010, and underwent cystoscopy with attempted ureteroscopy.  However, the caliber of his ureter would not permit ureteroscopy into the renal pelvis and therefore definitive treatment of his stone was unable to be performed.  A ureteral stent was placed for pain control as well as to allow for passive stent dilation and subsequent definitive therapy. He was initially to be discharged home postoperatively but developed severe nausea and vomiting and was admitted overnight for continued IVF hydration.  He was ready for discharge the following  day on 11/26/10.  DISPOSITION:  Home.  DISCHARGE MEDICATIONS:  He was instructed that he may resume his  regular home medications.  He was provided a prescription to take Percocet as needed for pain.  DISCHARGE INSTRUCTIONS:  He was instructed to resume activity and diet as tolerated.  FOLLOWUP:  He will be scheduled for definitive ureteroscopic laser lithotripsy following passive stent dilation of his left ureter.     Heloise Purpura, MD     LB/MEDQ  D:  11/25/2010  T:  11/26/2010  Job:  [id number]  Electronically Signed by Heloise Purpura MD on 11/26/2010 11:11:27 PM

## 2010-12-15 ENCOUNTER — Other Ambulatory Visit: Payer: Self-pay | Admitting: Urology

## 2010-12-15 ENCOUNTER — Encounter (HOSPITAL_COMMUNITY): Payer: BC Managed Care – PPO

## 2010-12-15 LAB — COMPREHENSIVE METABOLIC PANEL
ALT: 22 U/L (ref 0–53)
AST: 25 U/L (ref 0–37)
Albumin: 4.3 g/dL (ref 3.5–5.2)
Alkaline Phosphatase: 59 U/L (ref 39–117)
BUN: 9 mg/dL (ref 6–23)
CO2: 28 mEq/L (ref 19–32)
Calcium: 9.6 mg/dL (ref 8.4–10.5)
Chloride: 106 mEq/L (ref 96–112)
Creatinine, Ser: 0.99 mg/dL (ref 0.4–1.5)
GFR calc Af Amer: >60 mL/min (ref >60)
GFR calc non Af Amer: >60 mL/min (ref >60)
Glucose, Bld: 133 mg/dL — ABNORMAL HIGH (ref 70–99)
Potassium: 3.5 mEq/L (ref 3.5–5.1)
Sodium: 142 mEq/L (ref 135–145)
Total Bilirubin: 1.6 mg/dL — ABNORMAL HIGH (ref 0.3–1.2)
Total Protein: 7.1 g/dL (ref 6.0–8.3)

## 2010-12-15 LAB — CBC
HCT: 42.8 % (ref 39.0–52.0)
Hemoglobin: 14.4 g/dL (ref 13.0–17.0)
MCH: 28.6 pg (ref 26.0–34.0)
MCHC: 33.6 g/dL (ref 30.0–36.0)
MCV: 84.9 fL (ref 78.0–100.0)
Platelets: 246 10*3/uL (ref 150–400)
RBC: 5.04 MIL/uL (ref 4.22–5.81)
RDW: 14.8 % (ref 11.5–15.5)
WBC: 8.5 10*3/uL (ref 4.0–10.5)

## 2010-12-15 LAB — SURGICAL PCR SCREEN
MRSA, PCR: NEGATIVE
Staphylococcus aureus: NEGATIVE

## 2010-12-20 ENCOUNTER — Ambulatory Visit (HOSPITAL_COMMUNITY)
Admission: RE | Admit: 2010-12-20 | Discharge: 2010-12-20 | Disposition: A | Discharge status: Home / Self Care | Payer: BC Managed Care – PPO | Source: Ambulatory Visit | Attending: Urology | Admitting: Urology

## 2010-12-20 DIAGNOSIS — Z01812 Encounter for preprocedural laboratory examination: Secondary | ICD-10-CM | POA: Insufficient documentation

## 2010-12-20 DIAGNOSIS — N200 Calculus of kidney: Secondary | ICD-10-CM | POA: Insufficient documentation

## 2010-12-20 DIAGNOSIS — Z01810 Encounter for preprocedural cardiovascular examination: Secondary | ICD-10-CM | POA: Insufficient documentation

## 2010-12-20 DIAGNOSIS — Z79899 Other long term (current) drug therapy: Secondary | ICD-10-CM | POA: Insufficient documentation

## 2010-12-30 HISTORY — PX: STONE EXTRACTION WITH BASKET: SHX5318

## 2011-01-06 NOTE — Op Note (Signed)
NAMEEBIN, PALAZZI NO.:  [id number]  MEDICAL RECORD NO.:  [id number]           PATIENT TYPE:  O  LOCATION:  [facility]                         FACILITY:  [facility]  PHYSICIAN:  Heloise Purpura, MD      DATE OF BIRTH:  11/22/1965  DATE OF PROCEDURE:  12/20/2010 DATE OF DISCHARGE:                              OPERATIVE REPORT   PREOPERATIVE DIAGNOSES: 1. Left pelvic kidney. 2. Left renal calculus.  POSTOPERATIVE DIAGNOSIS: 1. Left pelvic kidney. 2. Left renal calculus.  PROCEDURES.: 1. Cystoscopy. 2. Left retrograde pyelography with interpretation. 3. Left ureteroscopy with laser lithotripsy and stone removal. 4. Left ureteral stent placement (5 x 24 Polaris).  SURGEON:  Heloise Purpura, MD  ANESTHESIA:  General.  COMPLICATIONS:  None.  ESTIMATED BLOOD LOSS:  Minimal.  INDICATIONS:  Mr. Lepore is a 45 year old gentleman who initially presented with an obstructing ureteropelvic junction calculus of his left pelvic kidney.  He was admitted due to significant pain and underwent attempted ureteroscopy for treatment, which was unsuccessful due to the small caliber of his ureter.  He therefore had a ureteral stent placed and follows up today for definitive stone management after allowing for passive stent dilation of his ureter.  The potential risks, complications, and alternative treatment options have been discussed in detail and informed consent obtained.  DESCRIPTION OF PROCEDURE:  The patient was taken to the operating room and a general anesthetic was administered.  He was given preoperative antibiotics, placed in the dorsal lithotomy position, and prepped and draped in the usual sterile fashion.  Next, a preoperative time-out was performed.  Cystourethroscopy was then performed which revealed a normal anterior and posterior urethra.  Inspection of the bladder revealed no evidence of any bladder tumors, stones, or other mucosal pathology.  The patient's  indwelling ureteral stent was identified and brought out the urethral meatus with the aid of the flexible graspers.  A 0.038 sensor guidewire was then advanced through the stent up into the left renal pelvis under fluoroscopic guidance.  A 6-French ureteral catheter was then advanced over the wire using Seldinger technique.  It was advanced into the mid proximal ureter and Omnipaque contrast was injected.  This demonstrated a large filling defect in the lower pole of the kidney consistent with the patient's known calculus which had been pushed up into the renal pelvis during his prior stent placement.  A 0.038 sensor guidewire was then advanced back through the ureteral catheter into the renal pelvis and a 12/14 [place] [employer] access sheath was advanced over the wire up into the proximal ureter.  The digital flexible ureteroscope was then advanced up into the ureter.  There was still noted to be a narrowed caliber ureteropelvic junction and the ureteroscope was not able to be easily passed up to this area.  Therefore, the wire was replaced through the ureteroscope and the ureteroscope was then passed easily over the wire into the renal pelvis.  The patient's stone was identified in the lower pole and a 200-micron holmium laser fiber was used to fragment the stone into multiple smaller fragments.  These fragments, however, were noted still to be too large  to pass through the narrowed ureteropelvic junction.  Therefore, the ureteroscope was removed and a 0.038 sensor guidewire was advanced back up into the renal pelvis.  The inner sheath of the ureteral access sheath was again advanced over the wire and the access sheath was advanced up past the ureteropelvic junction into the renal pelvis.  The aforementioned stones were then removed with a 0 tip nitinol basket.  Once all sizable stone fragments were removed, the guidewire was replaced and the ureteroscope and access sheath were removed.  The  wire was then back loaded on the cystoscope and a 5 x 24 Polaris ureteral stent was advanced over the wire using Seldinger technique.  It was positioned appropriately under fluoroscopic and cystoscopic guidance.  The wire was removed with a good proximal curl noted in the distal aspect of the stent confirmed to be in the correct position under cystoscopic guidance.  The patient's bladder was emptied.  A string tether was left in place.  He tolerated the procedure well without complications.  INTRAOPERATIVE FINDINGS:  Left retrograde pyelography revealed a normal caliber ureter up to the level of the ureteropelvic junction which was noted to be somewhat stenotic.  The renal pelvis was anteriorly rotated and there was noted be a filling defect in the lower pole consistent with the patient's known calculus.     Heloise Purpura, MD     LB/MEDQ  D:  12/20/2010  T:  12/20/2010  Job:  [id number]  Electronically Signed by Heloise Purpura MD on 01/06/2011 06:01:13 AM

## 2011-02-01 NOTE — Discharge Summary (Signed)
John Costa, John Costa NO.:  [id number]   MEDICAL RECORD NO.:  [id number]          PATIENT TYPE:  INP   LOCATION:  6731                         FACILITY:  [facility]   PHYSICIAN:  Valerie A. Felicity Coyer, MDDATE OF BIRTH:  Jan 26, 1966   DATE OF ADMISSION:  07/06/2008  DATE OF DISCHARGE:  07/08/2008                               DISCHARGE SUMMARY   DISCHARGE DIAGNOSES:  1. Hematemesis with acute blood loss anemia, felt likely secondary to      nonsteroidal antiinflammatory drug gastritis and possibly Mallory-      Weiss tear, status post gastrointestinal evaluation this admission      by Dr. Stan Head.  2. Nausea and vomiting in the setting of acute influenza and positive      H1N1 contact.  Plan to complete Tamiflu.  3. Isolated elevated total bilirubin on admission, resolved.   HISTORY OF PRESENT ILLNESS:  John Costa is a 45 year old white male,  admitted on July 06, 2008, with chief complaint of vomiting of bright  red blood.  He was diagnosed at the Urgent Care Center with H1N1 on  July 05, 2008, in the setting of positive sick household contact.  He  went to the [facility] Emergency Department on July 06, 2008,  secondary to vomiting, coffee grounds and blood initially, which looked  like thin chocolate and later it looked like dark red gelatin.  He felt  very weak with near-syncope, and therefore came to the emergency  department.  He vomited x1 in the ED, clear yellow emesis, was followed  with chunks.  He was admitted for further evaluation and treatment.   PAST MEDICAL HISTORY:  History of headache with positive cerebral  aneurysm in 2007.   COURSE OF HOSPITALIZATION:  Hematemesis.  The patient was admitted, he  was placed on a proton pump inhibitor.  His hemoglobin on admission was  14.8, hemoglobin on July 07, 2008, was 11.2, which was likely in part  secondary to hemodilution.  Today, his hemoglobin is 11.5.  He has had  no further nausea  or vomiting and no stools.  He was seen in  consultation by Dr. Stan Head, and it was felt that his symptoms may  be due to NSAID-induced gastritis and that he may also had a Mallory-  Weiss tear in setting of vomiting.  At this time, we will plan to  discontinue NSAIDs and continue a proton pump inhibitor.  We were also  placing his aspirin on hold.  He is scheduled for a followup CBC next  week in the office with Dr. Birdie Sons, and this will be followed with  a full appointment on July 22, 2008.   MEDICATIONS AT THE TIME OF DISCHARGE:  1. Tamiflu 75 mg twice daily through July 09, 2008, then stop.  2. Promethazine/codeine syrup 1 teaspoon 4 times daily as needed.  3. Tylenol 650 mg every 6 hours as needed.  4. Imitrex 100 mg as needed.  5. Protonix 40 mg p.o. b.i.d. x1 month, then Protonix 40 mg p.o.      daily.  The patient was instructed that if Protonix is  cost      prohibited that he may substitute Prilosec OTC.   PERTINENT LABORATORIES AT THE TIME OF DISCHARGE:  BUN 13 and creatinine  1.05.  Hemoglobin 11.5 and hematocrit 32.7.   DISPOSITION:  He will be discharged to home.   FOLLOWUP:  He is to follow up at [facility] office on July 14, 2008, at 10:30 a.m. for repeat H&H.  In addition, he is to follow up  for full appointment with Dr. Cato Mulligan on July 22, 2008, at 10:30 a.m.  and with Dr. Stan Head on August 06, 2008, at 10:30 a.m.  He is  instructed to use Tylenol as needed for pain or fever.  Avoid all  NSAIDs, i.e. ibuprofen/Advil/Motrin/Aleve, etc., and to hold aspirin  until further instructions from Dr. Cato Mulligan.  He is also instructed to  return to the ER should he develop bright red blood or coffee ground  from his vomit or bright red or tar-like stools.   Greater than 30 minutes were spent on discharge planning.       Sandford Craze, NP      Raenette Rover. Felicity Coyer, MD  Electronically Signed    MO/MEDQ  D:  07/08/2008  T:   07/08/2008  Job:  [id number]   cc:   Valetta Mole. Swords, MD  Iva Boop, MD,FACG

## 2011-02-08 ENCOUNTER — Encounter: Payer: Self-pay | Admitting: Internal Medicine

## 2011-02-08 ENCOUNTER — Ambulatory Visit (INDEPENDENT_AMBULATORY_CARE_PROVIDER_SITE_OTHER): Payer: BC Managed Care – PPO | Admitting: Internal Medicine

## 2011-02-08 VITALS — BP 120/88 | Temp 98.7°F | Ht 71.0 in | Wt 225.0 lb

## 2011-02-08 DIAGNOSIS — R42 Dizziness and giddiness: Secondary | ICD-10-CM

## 2011-02-08 DIAGNOSIS — I720 Aneurysm of carotid artery: Secondary | ICD-10-CM

## 2011-02-08 DIAGNOSIS — I671 Cerebral aneurysm, nonruptured: Secondary | ICD-10-CM

## 2011-02-08 LAB — BASIC METABOLIC PANEL
BUN: 17 mg/dL (ref 6–23)
CO2: 29 mEq/L (ref 19–32)
Calcium: 9.8 mg/dL (ref 8.4–10.5)
Chloride: 104 mEq/L (ref 96–112)
Creatinine, Ser: 1.1 mg/dL (ref 0.4–1.5)
GFR: 79.68 mL/min (ref >60.00)
Glucose, Bld: 82 mg/dL (ref 70–99)
Potassium: 4.3 mEq/L (ref 3.5–5.1)
Sodium: 141 mEq/L (ref 135–145)

## 2011-02-08 LAB — CBC WITH DIFFERENTIAL/PLATELET
Basophils Absolute: 0.1 10*3/uL (ref 0.0–0.1)
Basophils Relative: 0.6 % (ref 0.0–3.0)
Eosinophils Absolute: 0.1 10*3/uL (ref 0.0–0.7)
Eosinophils Relative: 1.2 % (ref 0.0–5.0)
HCT: 42.9 % (ref 39.0–52.0)
Hemoglobin: 15.1 g/dL (ref 13.0–17.0)
Lymphocytes Relative: 19.6 % (ref 12.0–46.0)
Lymphs Abs: 2.2 10*3/uL (ref 0.7–4.0)
MCHC: 35.1 g/dL (ref 30.0–36.0)
MCV: 86.4 fl (ref 78.0–100.0)
Monocytes Absolute: 0.5 10*3/uL (ref 0.1–1.0)
Monocytes Relative: 4.8 % (ref 3.0–12.0)
Neutro Abs: 8.3 10*3/uL — ABNORMAL HIGH (ref 1.4–7.7)
Neutrophils Relative %: 73.8 % (ref 43.0–77.0)
Platelets: 247 10*3/uL (ref 150.0–400.0)
RBC: 4.97 Mil/uL (ref 4.22–5.81)
RDW: 16.5 % — ABNORMAL HIGH (ref 11.5–14.6)
WBC: 11.2 10*3/uL — ABNORMAL HIGH (ref 4.5–10.5)

## 2011-02-08 MED ORDER — MECLIZINE HCL 25 MG PO TABS: 25.0000 mg | ORAL_TABLET | Freq: Three times a day (TID) | ORAL | Status: DC | PRN

## 2011-02-12 ENCOUNTER — Encounter: Payer: Self-pay | Admitting: Internal Medicine

## 2011-02-12 DIAGNOSIS — R42 Dizziness and giddiness: Secondary | ICD-10-CM | POA: Insufficient documentation

## 2011-02-12 NOTE — Assessment & Plan Note (Signed)
Neurologically nonfocal. Antivert p.r.n. Caution regarding possible sedating effect. Obtain CBC and Chem-7.Followup if no improvement or worsening.

## 2011-02-12 NOTE — Progress Notes (Signed)
  Subjective:    Patient ID: HESTER FORGET, male    DOB: 1966-01-15, 45 y.o.   MRN: [id number]  HPI Patient presents to clinic for evaluation of presyncope. Has had 2 episodes of presyncope one while tying his shoes and the other was without obvious trigger. States one month ago had a kidney stone surgery and since that time has had persistent dizziness. Episodes occurred over several hours to become more regular over the past 4-5 days. This has provoked mild anxiety while driving. No associated syncope, visual changes or neurologic deficits. Past medical history reviewed and has history of carotid artery aneurysms status post angiograms in the past. States was seen at the [facility] clinic and recommended for yearly CT angiograms. Denies having had CT within the past approximate 2 years. No other alleviating or exacerbating factors. No other complaints.  Reviewed past medical history, medications and allergies  Review of Systems see history of present illness     Objective:   Physical Exam  [nursing notereviewed. Constitutional: He appears well-developed and well-nourished. No distress.  HENT:  Head: Normocephalic and atraumatic.  Right Ear: Tympanic membrane, external ear and ear canal normal.  Left Ear: Tympanic membrane, external ear and ear canal normal.       Some dizziness reproduced with repeated lateral head turning  Eyes: Conjunctivae are normal. No scleral icterus.  Neck: Neck supple.  Neurological: He is alert. No cranial nerve deficit. Coordination normal.  Skin: Skin is warm and dry. He is not diaphoretic.  Psychiatric: He has a normal mood and affect.          Assessment & Plan:

## 2011-02-12 NOTE — Assessment & Plan Note (Signed)
Neurologically nonfocal. Due for surveillance. Schedule CT angiogram

## 2011-02-16 ENCOUNTER — Telehealth: Payer: Self-pay

## 2011-02-16 ENCOUNTER — Telehealth: Payer: Self-pay | Admitting: Internal Medicine

## 2011-02-16 ENCOUNTER — Ambulatory Visit (INDEPENDENT_AMBULATORY_CARE_PROVIDER_SITE_OTHER)
Admission: RE | Admit: 2011-02-16 | Discharge: 2011-02-16 | Disposition: A | Discharge status: Home / Self Care | Payer: BC Managed Care – PPO | Source: Ambulatory Visit | Attending: Internal Medicine | Admitting: Internal Medicine

## 2011-02-16 DIAGNOSIS — I671 Cerebral aneurysm, nonruptured: Secondary | ICD-10-CM

## 2011-02-16 DIAGNOSIS — I720 Aneurysm of carotid artery: Secondary | ICD-10-CM

## 2011-02-16 MED ORDER — IOHEXOL 350 MG/ML SOLN
100.0000 mL | Freq: Once | INTRAVENOUS | Status: AC | PRN
  Administered 2011-02-16: 100 mL via INTRAVENOUS

## 2011-02-16 NOTE — Telephone Encounter (Signed)
Pt would like to be notified when CTA results are available.

## 2011-02-16 NOTE — Telephone Encounter (Signed)
Message copied by Beverely Low on Wed Feb 16, 2011  1:47 PM ------      Message from: Staci Righter      Created: Tue Feb 15, 2011  9:50 PM       Labs ok

## 2011-02-16 NOTE — Telephone Encounter (Signed)
Pt notified labs ok

## 2011-02-17 NOTE — Telephone Encounter (Signed)
I suspect Dr. Rodena Medin will respond to CT that he ordered. I f not I'll be happy to.

## 2011-02-18 ENCOUNTER — Encounter: Payer: Self-pay | Admitting: Internal Medicine

## 2011-02-18 ENCOUNTER — Ambulatory Visit (INDEPENDENT_AMBULATORY_CARE_PROVIDER_SITE_OTHER): Payer: BC Managed Care – PPO | Admitting: Internal Medicine

## 2011-02-18 ENCOUNTER — Telehealth: Payer: Self-pay

## 2011-02-18 VITALS — BP 120/80 | HR 62 | Wt 226.0 lb

## 2011-02-18 DIAGNOSIS — I671 Cerebral aneurysm, nonruptured: Secondary | ICD-10-CM

## 2011-02-18 DIAGNOSIS — I720 Aneurysm of carotid artery: Secondary | ICD-10-CM

## 2011-02-18 DIAGNOSIS — R42 Dizziness and giddiness: Secondary | ICD-10-CM

## 2011-02-18 NOTE — Telephone Encounter (Signed)
Is being instructed to come in for appt to discuss.

## 2011-02-18 NOTE — Telephone Encounter (Signed)
done

## 2011-02-18 NOTE — Progress Notes (Signed)
  Subjective:    Patient ID: ALPER GUILMETTE, male    DOB: 1966-04-07, 45 y.o.   MRN: [id number]  HPI Patient presents to clinic for followup of internal carotid aneurysms.  Long-standing history of carotid aneurysms initially diagnosed and followed by the [facility] 2008. Was subsequently evaluated in 2009 by vascular radiology who did not recommend surgical intervention. Was at that time on Plavix and aspirin but subsequently stopped the medication. Recently evaluated in clinic here for dizziness with no other focal neurologic symptoms. Does note intermittent recent left facial numbness without difficulty with speech, visual disturbance or focal weakness. Reviewed in detail with he and his wife CT angiogram of neck. 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 The right ICA demonstrates a focal aneursym ~59mm. Review of MRA 2008 suggests previous areas of concern were ~7-73mm as well. Patency of both vessels to the brain noted.  Total time of appt ~30 minutes of which >50% of time spent in counseling.     Review of Systems see hpi     Objective:   Physical Exam  [nursing notereviewed. Constitutional: He appears well-developed and well-nourished. No distress.  HENT:  Head: Normocephalic and atraumatic.  Neurological: He is alert.  Skin: He is not diaphoretic.          Assessment & Plan:

## 2011-02-18 NOTE — Assessment & Plan Note (Signed)
Persistent. Doubt relationship to carotid artery findings but proceed with vascular re-evaluation. If agreed to be unrelated and sx persists then consider ENT consult.

## 2011-02-18 NOTE — Telephone Encounter (Signed)
Message copied by Beverely Low on Fri Feb 18, 2011 12:07 PM ------      Message from: Staci Righter      Created: Thu Feb 17, 2011 10:00 PM       appt to discuss ct scan results

## 2011-02-18 NOTE — Assessment & Plan Note (Signed)
No direct comparison available currently for 2009 imaging. Vascular radiology note review of 2008 imaging suggests stability but this cannot be confirmed. Recommend followup with Dr. Jomarie Longs of vascular radiology for re-evaluation. Request next available appt. Reminded to present to the nearest ED with any focal neurologic deficit and these are reviewed in layman's terms. States understanding and agreement.

## 2011-02-18 NOTE — Telephone Encounter (Signed)
Pt aware appt made

## 2011-03-14 ENCOUNTER — Ambulatory Visit (INDEPENDENT_AMBULATORY_CARE_PROVIDER_SITE_OTHER): Payer: BC Managed Care – PPO | Admitting: Internal Medicine

## 2011-03-14 ENCOUNTER — Encounter: Payer: Self-pay | Admitting: Internal Medicine

## 2011-03-14 DIAGNOSIS — F3289 Other specified depressive episodes: Secondary | ICD-10-CM

## 2011-03-14 DIAGNOSIS — F32A Depression, unspecified: Secondary | ICD-10-CM | POA: Insufficient documentation

## 2011-03-14 DIAGNOSIS — L259 Unspecified contact dermatitis, unspecified cause: Secondary | ICD-10-CM

## 2011-03-14 DIAGNOSIS — L309 Dermatitis, unspecified: Secondary | ICD-10-CM

## 2011-03-14 DIAGNOSIS — F329 Major depressive disorder, single episode, unspecified: Secondary | ICD-10-CM

## 2011-03-14 MED ORDER — BUPROPION HCL ER (XL) 300 MG PO TB24: 300.0000 mg | ORAL_TABLET | Freq: Every day | ORAL | Status: DC

## 2011-03-14 MED ORDER — TRIAMCINOLONE ACETONIDE 0.1 % EX CREA: TOPICAL_CREAM | Freq: Three times a day (TID) | CUTANEOUS | Status: DC

## 2011-03-14 NOTE — Patient Instructions (Signed)
Return office visit with Dr. Cato Mulligan  in 6 weeks

## 2011-03-14 NOTE — Progress Notes (Signed)
  Subjective:    Patient ID: John Costa, male    DOB: [birth year], 45 y.o.   MRN: [id number]  HPI   45 year old patient who presents with a chief complaint of a rash. He has a pruritic erythematous papular eruption in the bathing suit distribution. These are quite pruritic and occurred after a bike ride through a heavily wooded area. He also has a history of depression which was treated successfully with Wellbutrin in the past. He feels this has reoccurred in part situational related to the recent death of his father. He states it is not severe but significant and feels that retreatment is indicated. He also describes some anxiety and is concerned that Wellbutrin might aggravate his anxiety and panic tendencies.    Review of Systems  Skin: Positive for rash.  Psychiatric/Behavioral: Positive for dysphoric mood. The patient is nervous/anxious.        Objective:   Physical Exam  Constitutional: He appears well-developed and well-nourished. No distress.  Skin: Rash noted.       Scattered erythematous papular lesions with some excoriations in the bathing suit distribution of the upper inner thighs and buttock region  Psychiatric: He has a normal mood and affect. His behavior is normal. Thought content normal.          Assessment & Plan:   Dermatitis. Probably chigger bites Recurrent depression. Options were discussed and elected to resume Wellbutrin. If anxiety or panic is exacerbated we'll notify the office and treated with SSRI  Will return in 6 weeks for followup

## 2011-04-06 ENCOUNTER — Telehealth: Payer: Self-pay

## 2011-04-06 DIAGNOSIS — I671 Cerebral aneurysm, nonruptured: Secondary | ICD-10-CM

## 2011-04-06 NOTE — Telephone Encounter (Signed)
Pt would like ENT consult. Discussed with you at visit 02-18-11

## 2011-04-06 NOTE — Telephone Encounter (Signed)
pmd

## 2011-04-07 ENCOUNTER — Telehealth: Payer: Self-pay | Admitting: Internal Medicine

## 2011-04-07 DIAGNOSIS — I671 Cerebral aneurysm, nonruptured: Secondary | ICD-10-CM

## 2011-04-07 NOTE — Telephone Encounter (Signed)
Requesting a referral to an Ent. Been having vertigo and has seen Dr Rodena Medin about this. He is Dr Cato Mulligan' pt. Please refer. Every ent place that he has called,  told him that he will need a referral.

## 2011-04-07 NOTE — Telephone Encounter (Signed)
Please refer to ENT for internal carotid artery aneurysms but put in Dr. Cato Mulligan' name please

## 2011-04-07 NOTE — Telephone Encounter (Signed)
Referral placed.

## 2011-04-08 ENCOUNTER — Other Ambulatory Visit: Payer: Self-pay | Admitting: Internal Medicine

## 2011-04-08 ENCOUNTER — Telehealth: Payer: Self-pay | Admitting: *Deleted

## 2011-04-08 DIAGNOSIS — R42 Dizziness and giddiness: Secondary | ICD-10-CM

## 2011-04-08 NOTE — Telephone Encounter (Signed)
Opened in error

## 2011-04-08 NOTE — Telephone Encounter (Signed)
Referral sent and Left message on machine for patient

## 2011-04-08 NOTE — Telephone Encounter (Signed)
Perm dr Lovell Sheehan- with the cerebral anuersym he needs to go to a neurologist- please refer

## 2011-06-21 LAB — HEMOGLOBIN AND HEMATOCRIT, BLOOD
HCT: 31.8 — ABNORMAL LOW
HCT: 32.7 — ABNORMAL LOW
HCT: 33 — ABNORMAL LOW
HCT: 33.5 — ABNORMAL LOW
HCT: 34.3 — ABNORMAL LOW
HCT: 35.4 — ABNORMAL LOW
Hemoglobin: 10.9 — ABNORMAL LOW
Hemoglobin: 11.4 — ABNORMAL LOW
Hemoglobin: 11.4 — ABNORMAL LOW
Hemoglobin: 11.5 — ABNORMAL LOW
Hemoglobin: 11.7 — ABNORMAL LOW
Hemoglobin: 12 — ABNORMAL LOW

## 2011-06-21 LAB — COMPREHENSIVE METABOLIC PANEL
ALT: 23
AST: 40 — ABNORMAL HIGH
Albumin: 4.2
Alkaline Phosphatase: 71
BUN: 33 — ABNORMAL HIGH
CO2: 23
Calcium: 9.7
Chloride: 106
Creatinine, Ser: 1.09
GFR calc Af Amer: >60
GFR calc non Af Amer: >60
Glucose, Bld: 150 — ABNORMAL HIGH
Potassium: 4.7
Sodium: 138
Total Bilirubin: 2.8 — ABNORMAL HIGH
Total Protein: 7.2

## 2011-06-21 LAB — HEPATIC FUNCTION PANEL
ALT: 18
AST: 20
Albumin: 3.2 — ABNORMAL LOW
Alkaline Phosphatase: 51
Bilirubin, Direct: 0.2
Indirect Bilirubin: 0.9
Total Bilirubin: 1.1
Total Protein: 5.7 — ABNORMAL LOW

## 2011-06-21 LAB — BASIC METABOLIC PANEL
BUN: 13
CO2: 29
Calcium: 8.6
Chloride: 109
Creatinine, Ser: 1.05
GFR calc Af Amer: >60
GFR calc non Af Amer: >60
Glucose, Bld: 83
Potassium: 3.6
Sodium: 143

## 2011-06-21 LAB — CBC
HCT: 42.5
Hemoglobin: 14.8
MCHC: 34.7
MCV: 84.6
Platelets: 282
RBC: 5.02
RDW: 15.9 — ABNORMAL HIGH
WBC: 13.6 — ABNORMAL HIGH

## 2011-06-21 LAB — DIFFERENTIAL
Basophils Absolute: 0
Basophils Relative: 0
Eosinophils Absolute: 0.1
Eosinophils Relative: 1
Lymphocytes Relative: 10 — ABNORMAL LOW
Lymphs Abs: 1.4
Monocytes Absolute: 0.8
Monocytes Relative: 6
Neutro Abs: 11.3 — ABNORMAL HIGH
Neutrophils Relative %: 83 — ABNORMAL HIGH

## 2011-06-21 LAB — PROTIME-INR
INR: 1.1
Prothrombin Time: 14

## 2011-06-21 LAB — TYPE AND SCREEN
ABO/RH(D): O POS
Antibody Screen: NEGATIVE

## 2011-06-21 LAB — ABO/RH: ABO/RH(D): O POS

## 2011-06-21 LAB — APTT: aPTT: 28

## 2011-06-21 LAB — LIPASE, BLOOD: Lipase: 19

## 2011-08-16 ENCOUNTER — Encounter (HOSPITAL_COMMUNITY): Payer: Self-pay | Admitting: Pharmacy Technician

## 2011-08-16 ENCOUNTER — Other Ambulatory Visit: Payer: Self-pay | Admitting: Urology

## 2011-08-17 ENCOUNTER — Encounter (HOSPITAL_COMMUNITY): Payer: Self-pay | Admitting: *Deleted

## 2011-08-17 NOTE — Progress Notes (Signed)
Pt instructed to take laxative 08/18/11

## 2011-08-18 ENCOUNTER — Ambulatory Visit (HOSPITAL_COMMUNITY): Payer: BC Managed Care – PPO

## 2011-08-18 ENCOUNTER — Encounter (HOSPITAL_COMMUNITY): Payer: Self-pay | Admitting: *Deleted

## 2011-08-18 ENCOUNTER — Encounter (HOSPITAL_COMMUNITY)
Admission: RE | Disposition: A | Discharge status: Home / Self Care | Payer: Self-pay | Source: Ambulatory Visit | Attending: Urology

## 2011-08-18 ENCOUNTER — Ambulatory Visit (HOSPITAL_COMMUNITY)
Admission: RE | Admit: 2011-08-18 | Discharge: 2011-08-18 | Disposition: A | Discharge status: Home / Self Care | Payer: BC Managed Care – PPO | Source: Ambulatory Visit | Attending: Urology | Admitting: Urology

## 2011-08-18 DIAGNOSIS — N201 Calculus of ureter: Secondary | ICD-10-CM

## 2011-08-18 HISTORY — DX: Pneumonia, unspecified organism: J18.9

## 2011-08-18 HISTORY — DX: Nausea with vomiting, unspecified: R11.2

## 2011-08-18 HISTORY — DX: Headache: R51

## 2011-08-18 HISTORY — DX: Gastro-esophageal reflux disease without esophagitis: K21.9

## 2011-08-18 HISTORY — DX: Anxiety disorder, unspecified: F41.9

## 2011-08-18 HISTORY — DX: Other specified postprocedural states: Z98.890

## 2011-08-18 HISTORY — DX: Major depressive disorder, single episode, unspecified: F32.9

## 2011-08-18 HISTORY — DX: Depression, unspecified: F32.A

## 2011-08-18 SURGERY — LITHOTRIPSY, ESWL
Anesthesia: LOCAL | Laterality: Left

## 2011-08-18 MED ORDER — DIAZEPAM 5 MG PO TABS: 10.0000 mg | ORAL_TABLET | ORAL | Status: DC

## 2011-08-18 MED ORDER — DIPHENHYDRAMINE HCL 25 MG PO CAPS: 25.0000 mg | ORAL_CAPSULE | ORAL | Status: DC

## 2011-08-18 MED ORDER — DIAZEPAM 5 MG PO TABS
ORAL_TABLET | ORAL | Status: AC
  Filled 2011-08-18: qty 2

## 2011-08-18 MED ORDER — CIPROFLOXACIN IN D5W 400 MG/200ML IV SOLN
400.0000 mg | INTRAVENOUS | Status: AC
  Administered 2011-08-18: 400 mg via INTRAVENOUS
  Filled 2011-08-18: qty 200

## 2011-08-18 MED ORDER — DIPHENHYDRAMINE HCL 25 MG PO CAPS
ORAL_CAPSULE | ORAL | Status: AC
  Filled 2011-08-18: qty 1

## 2011-08-18 MED ORDER — DEXTROSE-NACL 5-0.45 % IV SOLN
INTRAVENOUS | Status: DC
  Administered 2011-08-18: 09:00:00 via INTRAVENOUS

## 2011-08-18 NOTE — Op Note (Signed)
Please see scanned lithotripsy operative note.

## 2011-08-18 NOTE — Progress Notes (Signed)
+   results of laxative Denies any  Aspirin or ibuprofen in last 3 days Teaching done,with patient and family

## 2012-05-08 ENCOUNTER — Encounter: Payer: Self-pay | Admitting: Internal Medicine

## 2012-05-08 ENCOUNTER — Ambulatory Visit (INDEPENDENT_AMBULATORY_CARE_PROVIDER_SITE_OTHER): Payer: BC Managed Care – PPO | Admitting: Internal Medicine

## 2012-05-08 VITALS — BP 134/84 | Temp 98.5°F | Wt 219.0 lb

## 2012-05-08 DIAGNOSIS — R21 Rash and other nonspecific skin eruption: Secondary | ICD-10-CM | POA: Insufficient documentation

## 2012-05-08 MED ORDER — TRAMADOL HCL 50 MG PO TABS: 50.0000 mg | ORAL_TABLET | Freq: Three times a day (TID) | ORAL | Status: DC | PRN

## 2012-05-08 MED ORDER — CEPHALEXIN 500 MG PO TABS: 1000.0000 mg | ORAL_TABLET | Freq: Two times a day (BID) | ORAL | Status: AC

## 2012-05-08 NOTE — Assessment & Plan Note (Addendum)
46 year old white male complains of blistering rash of his left foot. I do not think his symptoms are secondary to athlete's foot. His symptoms started after camping with his sons. They were recently diagnosed with impetigo. His toe symptoms may be secondary to early cellulitis. Treat with cephalexin 1000 mg twice daily for 7 days. Rash in his torso may be due to impetigo versus pityriasis rosea.  Patient advised to call office if symptoms persist or worsen.

## 2012-05-08 NOTE — Progress Notes (Signed)
  Subjective:    Patient ID: John Costa, male    DOB: 1965-11-22, 46 y.o.   MRN: [id number]  HPI  46 year old white male complains of blisters on the left toe. Symptoms started after he went camping with [employer] 3 weeks ago. His 2 sons developed a rash on their chest and torso and they were diagnosed with impetigo. Patient has brown spots on his chest and abdomen. They have not blistered. He denies fever or chills.  He has also tried antifungals for his left foot without any improvement.  Blisters on foot have not been itchy or red.  He also has a history of cerebral aneurysms. He was treated in 2008. He has occasional headaches. He takes tramadol as needed and requests refill.  Review of Systems Negative for fever or chills  Past Medical History  Diagnosis Date  . Cerebral aneurysm   . Headache     from cerebral aneurysm  2008  . GERD (gastroesophageal reflux disease)     occasional  . Anxiety   . Pneumonia     2010  . Depression     no treatment  . PONV (postoperative nausea and vomiting)     History   Social History  . Marital Status: Married    Spouse Name: N/A    Number of Children: N/A  . Years of Education: N/A   Occupational History  . Not on file.   Social History Main Topics  . Smoking status: Never Smoker   . Smokeless tobacco: Not on file  . Alcohol Use: Yes  . Drug Use: Yes    Special: Marijuana     1990s  . Sexually Active: Not on file   Other Topics Concern  . Not on file   Social History Narrative  . No narrative on file    Past Surgical History  Procedure Date  . Knee arthroscopies     has had this several times  . Stone extraction with basket 12/30/10  . Cerebral aneurysm repair 2010    No family history on file.  Allergies  Allergen Reactions  . Cheese Anaphylaxis    Current Outpatient Prescriptions on File Prior to Visit  Medication Sig Dispense Refill  . acetaminophen (TYLENOL) 500 MG tablet Take 1,000 mg by mouth  every 6 (six) hours as needed. For pain       . diphenhydrAMINE (BENADRYL) 25 MG tablet Take 25 mg by mouth at bedtime as needed. For sleep      . Polyethyl Glycol-Propyl Glycol (SYSTANE OP) Place 1 drop into both eyes at bedtime.        . Polyethylene Glycol 400 (BLINK TEARS OP) Place 1 drop into both eyes 3 (three) times daily as needed. For dry eyes         BP 134/84  Temp 98.5 F (36.9 C) (Oral)  Wt 219 lb (99.338 kg)       Objective:   Physical Exam  Constitutional: He appears well-developed and well-nourished.  Cardiovascular: Normal rate, regular rhythm and normal heart sounds.   Pulmonary/Chest: Effort normal and breath sounds normal. He has no wheezes.  Musculoskeletal: He exhibits no edema.  Skin:       Brown patches on chest and abdomen.   Left foot - broken blister.  Base of second digit with mild erythema.  Redness in webbing of 3rd and 4 th digit          Assessment & Plan:

## 2012-05-08 NOTE — Patient Instructions (Addendum)
Use triple antibiotic ointment on your left foot Please call our office if your rash does not improve or gets worse.

## 2012-07-31 ENCOUNTER — Encounter: Payer: Self-pay | Admitting: Internal Medicine

## 2012-07-31 ENCOUNTER — Ambulatory Visit (INDEPENDENT_AMBULATORY_CARE_PROVIDER_SITE_OTHER): Payer: BC Managed Care – PPO | Admitting: Internal Medicine

## 2012-07-31 VITALS — BP 122/82 | HR 80 | Temp 98.4°F | Resp 15 | Wt 223.0 lb

## 2012-07-31 DIAGNOSIS — F411 Generalized anxiety disorder: Secondary | ICD-10-CM

## 2012-07-31 MED ORDER — ALPRAZOLAM 0.25 MG PO TABS: 0.2500 mg | ORAL_TABLET | Freq: Two times a day (BID) | ORAL | Status: DC | PRN

## 2012-07-31 MED ORDER — ESCITALOPRAM OXALATE 10 MG PO TABS: 10.0000 mg | ORAL_TABLET | Freq: Every day | ORAL | Status: DC

## 2012-07-31 NOTE — Patient Instructions (Signed)
Followup with Dr. Cato Mulligan  in 6 weeks  Followup with the behavioral health specialist as scheduled

## 2012-07-31 NOTE — Progress Notes (Signed)
  Subjective:    Patient ID: John Costa, male    DOB: 1965/11/09, 46 y.o.   MRN: [id number]  HPI  46 year old patient who presents with a long history of greater than 10 years duration of what he describes as panic attacks.  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 Symptoms are severe to the point he must stop his car. Symptoms are interfering with the job obligations.  He has been treated with the anxiolytics and beta blockers in the past with some success. He has been treated with Wellbutrin for depression in the past  that worsened  the anxiety.  He has self-referred himself to a counselor that he will be seeing in 2 weeks   Review of Systems  Psychiatric/Behavioral: Positive for behavioral problems. The patient is nervous/anxious.        Objective:   Physical Exam  Constitutional: He is oriented to person, place, and time. He appears well-developed and well-nourished. No distress.  Neck: Normal range of motion.  Pulmonary/Chest: Breath sounds normal.  Musculoskeletal: Normal range of motion. He exhibits no edema and no tenderness.  Neurological: He is alert and oriented to person, place, and time.  Psychiatric: He has a normal mood and affect. His behavior is normal. Judgment and thought content normal.          Assessment & Plan:   Anxiety disorder.  The patient was encouraged to followup with behavioral health he will be placed on Lexapro 20 and when necessary alprazolam. Suggested followup with PCP in 6 weeks

## 2012-08-23 ENCOUNTER — Encounter: Payer: Self-pay | Admitting: Internal Medicine

## 2012-08-23 ENCOUNTER — Ambulatory Visit (INDEPENDENT_AMBULATORY_CARE_PROVIDER_SITE_OTHER): Payer: BC Managed Care – PPO | Admitting: Internal Medicine

## 2012-08-23 VITALS — BP 122/80 | HR 84 | Temp 98.3°F | Resp 18 | Wt 221.0 lb

## 2012-08-23 DIAGNOSIS — J40 Bronchitis, not specified as acute or chronic: Secondary | ICD-10-CM

## 2012-08-23 MED ORDER — HYDROCODONE-HOMATROPINE 5-1.5 MG/5ML PO SYRP: 5.0000 mL | ORAL_SOLUTION | Freq: Four times a day (QID) | ORAL | Status: AC | PRN

## 2012-08-23 MED ORDER — AZITHROMYCIN 250 MG PO TABS: ORAL_TABLET | ORAL | Status: DC

## 2012-08-23 NOTE — Progress Notes (Signed)
Subjective:    Patient ID: John Costa, male    DOB: 04-Feb-1966, 46 y.o.   MRN: [id number]  HPI  46 year old patient who is seen today with a chief complaint of cough. He has been ill for 2 weeks and now has developed worsening refractory cough. Cough is productive in the morning the fairly dry throughout the later part of the day. No recent fever. The patient's wife has also been ill and more recently has been treated for secondary pneumonia.  Past Medical History  Diagnosis Date  . Cerebral aneurysm   . Headache     from cerebral aneurysm  2008  . GERD (gastroesophageal reflux disease)     occasional  . Anxiety   . Pneumonia     2010  . Depression     no treatment  . PONV (postoperative nausea and vomiting)     History   Social History  . Marital Status: Married    Spouse Name: N/A    Number of Children: N/A  . Years of Education: N/A   Occupational History  . Not on file.   Social History Main Topics  . Smoking status: Never Smoker   . Smokeless tobacco: Not on file  . Alcohol Use: Yes  . Drug Use: Yes    Special: Marijuana     Comment: 1990s  . Sexually Active: Not on file   Other Topics Concern  . Not on file   Social History Narrative  . No narrative on file    Past Surgical History  Procedure Date  . Knee arthroscopies     has had this several times  . Stone extraction with basket 12/30/10  . Cerebral aneurysm repair 2010    No family history on file.  Allergies  Allergen Reactions  . Cheese Anaphylaxis    Current Outpatient Prescriptions on File Prior to Visit  Medication Sig Dispense Refill  . acetaminophen (TYLENOL) 500 MG tablet Take 1,000 mg by mouth every 6 (six) hours as needed. For pain       . ALPRAZolam (XANAX) 0.25 MG tablet Take 1 tablet (0.25 mg total) by mouth 2 (two) times daily as needed for sleep.  20 tablet  0  . diphenhydrAMINE (BENADRYL) 25 MG tablet Take 25 mg by mouth at bedtime as needed. For sleep      .  escitalopram (LEXAPRO) 10 MG tablet Take 1 tablet (10 mg total) by mouth daily.  90 tablet  3  . Polyethyl Glycol-Propyl Glycol (SYSTANE OP) Place 1 drop into both eyes at bedtime.        . Polyethylene Glycol 400 (BLINK TEARS OP) Place 1 drop into both eyes 3 (three) times daily as needed. For dry eyes       . traMADol (ULTRAM) 50 MG tablet Take 1 tablet (50 mg total) by mouth every 8 (eight) hours as needed. For pain  60 tablet  3    BP 122/80  Pulse 84  Temp 98.3 F (36.8 C) (Oral)  Resp 18  Wt 221 lb (100.245 kg)  SpO2 98%       Review of Systems  Constitutional: Positive for fatigue. Negative for fever, chills and appetite change.  HENT: Negative for hearing loss, ear pain, congestion, sore throat, trouble swallowing, neck stiffness, dental problem, voice change and tinnitus.   Eyes: Negative for pain, discharge and visual disturbance.  Respiratory: Positive for cough. Negative for chest tightness, wheezing and stridor.   Cardiovascular: Negative for chest pain, palpitations  and leg swelling.  Gastrointestinal: Negative for nausea, vomiting, abdominal pain, diarrhea, constipation, blood in stool and abdominal distention.  Genitourinary: Negative for urgency, hematuria, flank pain, discharge, difficulty urinating and genital sores.  Musculoskeletal: Negative for myalgias, back pain, joint swelling, arthralgias and gait problem.  Skin: Negative for rash.  Neurological: Positive for weakness. Negative for dizziness, syncope, speech difficulty, numbness and headaches.  Hematological: Negative for adenopathy. Does not bruise/bleed easily.  Psychiatric/Behavioral: Negative for behavioral problems and dysphoric mood. The patient is not nervous/anxious.        Objective:   Physical Exam  Constitutional: He is oriented to person, place, and time. He appears well-developed and well-nourished. No distress.       Continuous paroxysms of coughing  HENT:  Head: Normocephalic.  Right  Ear: External ear normal.  Left Ear: External ear normal.  Eyes: Conjunctivae normal and EOM are normal.  Neck: Normal range of motion.  Cardiovascular: Normal rate, regular rhythm and normal heart sounds.        Heart rate 90-95  Pulmonary/Chest: Effort normal and breath sounds normal.  Abdominal: Bowel sounds are normal.  Musculoskeletal: Normal range of motion. He exhibits no edema and no tenderness.  Neurological: He is alert and oriented to person, place, and time.  Psychiatric: He has a normal mood and affect. His behavior is normal.          Assessment & Plan:    URI with bronchitis and refractory cough. We'll treat with Hydromet and azithromycin

## 2012-08-23 NOTE — Patient Instructions (Signed)
Take over-the-counter expectorants and cough medications such as  Mucinex .  Call if there is no improvement in 5 to 7 days or if he developed worsening cough, fever, or new symptoms, such as shortness of breath or chest pain.

## 2012-09-24 ENCOUNTER — Ambulatory Visit (INDEPENDENT_AMBULATORY_CARE_PROVIDER_SITE_OTHER): Payer: BC Managed Care – PPO | Admitting: Family Medicine

## 2012-09-24 ENCOUNTER — Encounter: Payer: Self-pay | Admitting: Family Medicine

## 2012-09-24 VITALS — BP 112/76 | HR 89 | Temp 98.2°F | Wt 225.0 lb

## 2012-09-24 DIAGNOSIS — M109 Gout, unspecified: Secondary | ICD-10-CM

## 2012-09-24 MED ORDER — PREDNISONE 20 MG PO TABS: ORAL_TABLET | ORAL | Status: DC

## 2012-09-24 NOTE — Patient Instructions (Addendum)
-  take prednisone as instructed  -follow up if does not resolve as expected, worsens or problems taking this medications

## 2012-09-24 NOTE — Progress Notes (Signed)
Chief Complaint  Patient presents with  . gout flare    x 1 week     HPI:  Acute visit for gout flare: -has gout flares (about 10x) in his life, last in 2011 -this flare in L ankle started about 1 week ago - took naproxen twice and went away, then came back -taking indomethacin (expired), improved, but not resolved -has required prednisone in the past when did not resolve with NSAIDS -feels similar to other gout flares -does not want prophylactic tx -symptoms include acute erythema, pain and swelling of L ankle -denies: fevers, chills, malaise  ROS: See pertinent positives and negatives per HPI.  Past Medical History  Diagnosis Date  . Cerebral aneurysm   . Headache     from cerebral aneurysm  2008  . GERD (gastroesophageal reflux disease)     occasional  . Anxiety   . Pneumonia     2010  . Depression     no treatment  . PONV (postoperative nausea and vomiting)     No family history on file.  History   Social History  . Marital Status: Married    Spouse Name: N/A    Number of Children: N/A  . Years of Education: N/A   Social History Main Topics  . Smoking status: Never Smoker   . Smokeless tobacco: None  . Alcohol Use: Yes  . Drug Use: Yes    Special: Marijuana     Comment: 1990s  . Sexually Active: None   Other Topics Concern  . None   Social History Narrative  . None    Current outpatient prescriptions:acetaminophen (TYLENOL) 500 MG tablet, Take 1,000 mg by mouth every 6 (six) hours as needed. For pain , Disp: , Rfl: ;  ALPRAZolam (XANAX) 0.25 MG tablet, Take 1 tablet (0.25 mg total) by mouth 2 (two) times daily as needed for sleep., Disp: 20 tablet, Rfl: 0;  azithromycin (ZITHROMAX) 250 MG tablet, 2 initially then 1 daily for 4 more days, Disp: 6 tablet, Rfl: 0 diphenhydrAMINE (BENADRYL) 25 MG tablet, Take 25 mg by mouth at bedtime as needed. For sleep, Disp: , Rfl: ;  escitalopram (LEXAPRO) 10 MG tablet, Take 1 tablet (10 mg total) by mouth daily.,  Disp: 90 tablet, Rfl: 3;  Polyethyl Glycol-Propyl Glycol (SYSTANE OP), Place 1 drop into both eyes at bedtime.  , Disp: , Rfl: ;  Polyethylene Glycol 400 (BLINK TEARS OP), Place 1 drop into both eyes 3 (three) times daily as needed. For dry eyes , Disp: , Rfl:  traMADol (ULTRAM) 50 MG tablet, Take 1 tablet (50 mg total) by mouth every 8 (eight) hours as needed. For pain, Disp: 60 tablet, Rfl: 3;  predniSONE (DELTASONE) 20 MG tablet, 40mg  (2 tablets daily) for 5 days, then 20mg  (1 tablet) daily for 3 days, then 1/2 tablet daily for 4 days, Disp: 15 tablet, Rfl: 0  EXAM:  Filed Vitals:   09/24/12 0806  BP: 112/76  Pulse: 89  Temp: 98.2 F (36.8 C)    There is no height on file to calculate BMI.  GENERAL: vitals reviewed and listed above, alert, oriented, appears well hydrated and in no acute distress  MS: moves all extremities without noticeable abnormality -mild erythema and swelling of L ankle, no lesions, surrounding erythema, normal motion - though causes some pain  PSYCH: pleasant and cooperative, no obvious depression or anxiety  ASSESSMENT AND PLAN:  Discussed the following assessment and plan:  1. GOUT  predniSONE (DELTASONE) 20 MG  tablet   -very likely gout given hx and exam. Discussed other potential etiologies including infection though less likely with no systemic symptoms. Discussed diagnostic and treatment options. Pt prefers to try prednisone. Discussed risks and follow up. -Patient advised to return or notify a doctor immediately if symptoms worsen or persist or new concerns arise.  Patient Instructions  -take prednisone as instructed  -follow up if does not resolve as expected, worsens or problems taking this medications     KIM, HANNAH R.

## 2013-06-20 ENCOUNTER — Ambulatory Visit (INDEPENDENT_AMBULATORY_CARE_PROVIDER_SITE_OTHER): Payer: BC Managed Care – PPO | Admitting: Nurse Practitioner

## 2013-06-20 ENCOUNTER — Encounter: Payer: Self-pay | Admitting: Nurse Practitioner

## 2013-06-20 VITALS — BP 110/84 | HR 102 | Temp 98.2°F | Ht 71.0 in | Wt 230.5 lb

## 2013-06-20 DIAGNOSIS — L0291 Cutaneous abscess, unspecified: Secondary | ICD-10-CM

## 2013-06-20 DIAGNOSIS — L0390 Cellulitis, unspecified: Secondary | ICD-10-CM

## 2013-06-20 MED ORDER — DOXYCYCLINE HYCLATE 100 MG PO TABS: 100.0000 mg | ORAL_TABLET | Freq: Two times a day (BID) | ORAL | Status: DC

## 2013-06-20 NOTE — Progress Notes (Signed)
  Subjective:    Patient ID: John Costa, male    DOB: 05-22-1966, 47 y.o.   MRN: [id number]  Rash This is a new problem. The current episode started yesterday (field trip at a camp, thinks had few mosquito bites in area or redness). The problem has been gradually worsening since onset. The affected locations include the left arm (10 cm area of erythema. No central clearing.). The rash is characterized by redness and swelling (warm). He was exposed to an insect bite/sting (3 da). Associated symptoms include diarrhea, fatigue and joint pain. Pertinent negatives include no congestion, cough, eye pain, facial edema, fever, shortness of breath, sore throat or vomiting. Treatments tried: NSAIDS for aches. The treatment provided mild relief.      Review of Systems  Constitutional: Positive for fatigue. Negative for fever.  HENT: Positive for neck pain (c/o neck soreness along w/genaerl body aches). Negative for ear pain, congestion, sore throat, neck stiffness and sinus pressure.   Eyes: Negative for pain.  Respiratory: Negative for cough, chest tightness and shortness of breath.   Cardiovascular: Negative for chest pain.  Gastrointestinal: Positive for abdominal pain (cramps today) and diarrhea. Negative for vomiting.  Musculoskeletal: Positive for joint pain and arthralgias.  Skin: Positive for rash.  Neurological: Positive for headaches.  Hematological: Negative for adenopathy.       Objective:   Physical Exam  Vitals reviewed. Constitutional: He is oriented to person, place, and time. He appears well-developed and well-nourished. No distress.  HENT:  Head: Normocephalic and atraumatic.  Right Ear: External ear normal.  Left Ear: External ear normal.  Mouth/Throat: Oropharynx is clear and moist. No oropharyngeal exudate.  Eyes: Conjunctivae are normal. Right eye exhibits no discharge. Left eye exhibits no discharge.  Neck: Normal range of motion. Neck supple. No thyromegaly  present.  Cardiovascular: Regular rhythm and normal heart sounds.   Pulmonary/Chest: Effort normal and breath sounds normal. No respiratory distress. He has no wheezes.  Abdominal: Soft. He exhibits no distension. There is no tenderness.  Musculoskeletal:  Normal gait, negative kernig & brudzinski signs  Lymphadenopathy:    He has cervical adenopathy (anterior cervical LAD, NT, moveable).  Neurological: He is alert and oriented to person, place, and time.  Skin: Skin is warm and dry.     Large, multiple cafe au lait spots on back. 10 cm area of erythema inner aspect L arm. Traced periphery w/ink pen.  Psychiatric: He has a normal mood and affect. His behavior is normal. Thought content normal.          Assessment & Plan:  1. Cellulitis Body aches. No fever.F/u in 3 days. - doxycycline (VIBRA-TABS) 100 MG tablet; Take 1 tablet (100 mg total) by mouth 2 (two) times daily.  Dispense: 14 tablet; Refill: 0

## 2013-06-20 NOTE — Patient Instructions (Addendum)
Start antibiotic. Please follow up on Monday. Please monitor red area on arm, if it is getting larger over the weekend, or you develop fever, or rash on legs or tunk, please go to ER. Continue to use tylenol or ibuprophen for achyness & HA. See you Monday.  Cellulitis Cellulitis is an infection of the skin and the tissue beneath it. The infected area is usually red and tender. Cellulitis occurs most often in the arms and lower legs.  CAUSES  Cellulitis is caused by bacteria that enter the skin through cracks or cuts in the skin. The most common types of bacteria that cause cellulitis are Staphylococcus and Streptococcus. SYMPTOMS   Redness and warmth.  Swelling.  Tenderness or pain.  Fever. DIAGNOSIS  Your caregiver can usually determine what is wrong based on a physical exam. Blood tests may also be done. TREATMENT  Treatment usually involves taking an antibiotic medicine. HOME CARE INSTRUCTIONS   Take your antibiotics as directed. Finish them even if you start to feel better.  Keep the infected arm or leg elevated to reduce swelling.  Apply a warm cloth to the affected area up to 4 times per day to relieve pain.  Only take over-the-counter or prescription medicines for pain, discomfort, or fever as directed by your caregiver.  Keep all follow-up appointments as directed by your caregiver. SEEK MEDICAL CARE IF:   You notice red streaks coming from the infected area.  Your red area gets larger or turns dark in color.  Your bone or joint underneath the infected area becomes painful after the skin has healed.  Your infection returns in the same area or another area.  You notice a swollen bump in the infected area.  You develop new symptoms. SEEK IMMEDIATE MEDICAL CARE IF:   You have a fever.  You feel very sleepy.  You develop vomiting or diarrhea.  You have a general ill feeling (malaise) with muscle aches and pains. MAKE SURE YOU:   Understand these  instructions.  Will watch your condition.  Will get help right away if you are not doing well or get worse. Document Released: 06/15/2005 Document Revised: 03/06/2012 Document Reviewed: 11/21/2011 [facility] Patient Information 2014 [place], [place].

## 2013-06-24 ENCOUNTER — Encounter: Payer: Self-pay | Admitting: Nurse Practitioner

## 2013-06-24 ENCOUNTER — Ambulatory Visit (INDEPENDENT_AMBULATORY_CARE_PROVIDER_SITE_OTHER): Payer: BC Managed Care – PPO | Admitting: Nurse Practitioner

## 2013-06-24 VITALS — BP 120/80 | HR 78 | Temp 98.1°F | Ht 71.0 in | Wt 229.2 lb

## 2013-06-24 DIAGNOSIS — Z23 Encounter for immunization: Secondary | ICD-10-CM

## 2013-06-24 NOTE — Progress Notes (Signed)
Subjective:    John Costa is a 47 y.o. male who presents for follow up of cellulitis L arm. He was started on doxycycline 4 days ago. Doxycycline was chosen because pt had been on outdoor field trip with son, tick exposure was possible, although not documented. Initial symptoms included erythema L arm, joint aches, fatigue. Today, erythema is smaller, not hot, and fading in color intensity. Fatigue is better, but persistent. Joint aches have resolved.   The following portions of the patient's history were reviewed and updated as appropriate: allergies, current medications, past family history, past medical history, past social history, past surgical history and problem list.  Review of Systems Constitutional: positive for fatigue, negative for fevers and weight loss Respiratory: negative for cough, pleurisy/chest pain and wheezing Cardiovascular: negative for chest pain, irregular heart beat, lower extremity edema and near-syncope Gastrointestinal: negative for abdominal pain and change in bowel habits Integument/breast: positive for rash, negative for worsening lesion     Objective:    BP 120/80  Pulse 78  Temp(Src) 98.1 F (36.7 C) (Oral)  Ht 5\' 11"  (1.803 m)  Wt 229 lb 4 oz (103.987 kg)  BMI 31.99 kg/m2  SpO2 96% General appearance: alert, cooperative, appears stated age and no distress Head: Normocephalic, without obvious abnormality, atraumatic Eyes: negative findings: lids and lashes normal, conjunctivae and sclerae normal and corneas clear Skin: L arm erythema is resolving, area is normal temperature, edges are fading, color intensity fading. Has scaly area in center-perhaps where he has scratched.     Assessment:    Cellulitis of the L arm is resolving. Systemic symptoms :joint pain has resolved, fatigue is better. But persistent.    Plan:   See instructions.

## 2013-06-24 NOTE — Patient Instructions (Signed)
You are getting better. Continue antibiotic. Rest. Call if any concerns such as fever, persistent fatigue beyond 1 week. Keep appt. W/Dr Cato Mulligan next month.

## 2013-07-18 ENCOUNTER — Ambulatory Visit (INDEPENDENT_AMBULATORY_CARE_PROVIDER_SITE_OTHER): Payer: BC Managed Care – PPO | Admitting: Family Medicine

## 2013-07-18 VITALS — BP 126/82 | HR 92 | Temp 99.2°F | Resp 20 | Ht 69.5 in | Wt 224.2 lb

## 2013-07-18 DIAGNOSIS — R51 Headache: Secondary | ICD-10-CM

## 2013-07-18 DIAGNOSIS — B029 Zoster without complications: Secondary | ICD-10-CM

## 2013-07-18 DIAGNOSIS — R519 Headache, unspecified: Secondary | ICD-10-CM

## 2013-07-18 MED ORDER — VALACYCLOVIR HCL 1 G PO TABS: 1000.0000 mg | ORAL_TABLET | Freq: Three times a day (TID) | ORAL | Status: DC

## 2013-07-18 MED ORDER — OXYCODONE-ACETAMINOPHEN 5-325 MG PO TABS: 1.0000 | ORAL_TABLET | ORAL | Status: DC | PRN

## 2013-07-18 NOTE — Patient Instructions (Signed)
Shingles Shingles (herpes zoster) is an infection that is caused by the same virus that causes chickenpox (varicella). The infection causes a painful skin rash and fluid-filled blisters, which eventually break open, crust over, and heal. It may occur in any area of the body, but it usually affects only one side of the body or face. The pain of shingles usually lasts about 1 month. However, some people with shingles may develop long-term (chronic) pain in the affected area of the body. Shingles often occurs many years after the person had chickenpox. It is more common:  In people older than 50 years.  In people with weakened immune systems, such as those with HIV, AIDS, or cancer.  In people taking medicines that weaken the immune system, such as transplant medicines.  In people under great stress. CAUSES  Shingles is caused by the varicella zoster virus (VZV), which also causes chickenpox. After a person is infected with the virus, it can remain in the person's body for years in an inactive state (dormant). To cause shingles, the virus reactivates and breaks out as an infection in a nerve root. The virus can be spread from person to person (contagious) through contact with open blisters of the shingles rash. It will only spread to people who have not had chickenpox. When these people are exposed to the virus, they may develop chickenpox. They will not develop shingles. Once the blisters scab over, the person is no longer contagious and cannot spread the virus to others. SYMPTOMS  Shingles shows up in stages. The initial symptoms may be pain, itching, and tingling in an area of the skin. This pain is usually described as burning, stabbing, or throbbing.In a few days or weeks, a painful red rash will appear in the area where the pain, itching, and tingling were felt. The rash is usually on one side of the body in a band or belt-like pattern. Then, the rash usually turns into fluid-filled blisters. They  will scab over and dry up in approximately 2 3 weeks. Flu-like symptoms may also occur with the initial symptoms, the rash, or the blisters. These may include:  Fever.  Chills.  Headache.  Upset stomach. DIAGNOSIS  Your caregiver will perform a skin exam to diagnose shingles. Skin scrapings or fluid samples may also be taken from the blisters. This sample will be examined under a microscope or sent to a lab for further testing. TREATMENT  There is no specific cure for shingles. Your caregiver will likely prescribe medicines to help you manage the pain, recover faster, and avoid long-term problems. This may include antiviral drugs, anti-inflammatory drugs, and pain medicines. HOME CARE INSTRUCTIONS   Take a cool bath or apply cool compresses to the area of the rash or blisters as directed. This may help with the pain and itching.   Only take over-the-counter or prescription medicines as directed by your caregiver.   Rest as directed by your caregiver.  Keep your rash and blisters clean with mild soap and cool water or as directed by your caregiver.  Do not pick your blisters or scratch your rash. Apply an anti-itch cream or numbing creams to the affected area as directed by your caregiver.  Keep your shingles rash covered with a loose bandage (dressing).  Avoid skin contact with:  Babies.   Pregnant women.   Children with eczema.   Elderly people with transplants.   People with chronic illnesses, such as leukemia or AIDS.   Wear loose-fitting clothing to help ease   the pain of material rubbing against the rash.  Keep all follow-up appointments with your caregiver.If the area involved is on your face, you may receive a referral for follow-up to a specialist, such as an eye doctor (ophthalmologist) or an ear, nose, and throat (ENT) doctor. Keeping all follow-up appointments will help you avoid eye complications, chronic pain, or disability.  SEEK IMMEDIATE MEDICAL  CARE IF:   You have facial pain, pain around the eye area, or loss of feeling on one side of your face.  You have ear pain or ringing in your ear.  You have loss of taste.  Your pain is not relieved with prescribed medicines.   Your redness or swelling spreads.   You have more pain and swelling.  Your condition is worsening or has changed.   You have a feveror persistent symptoms for more than 2 3 days.  You have a fever and your symptoms suddenly get worse. MAKE SURE YOU:  Understand these instructions.  Will watch your condition.  Will get help right away if you are not doing well or get worse. Document Released: 09/05/2005 Document Revised: 05/30/2012 Document Reviewed: 04/19/2012 [employer] Patient Information 2014 [employer].  

## 2013-07-18 NOTE — Progress Notes (Signed)
[facility]:  Office Visit  Chief Complaint:  Chief Complaint  Patient presents with  . Rash    with pain on left hand side of his head and neck, pt thinks he may have shingles    HPI: EMANNUEL Costa is a 47 y.o. male who is here for left hand side discomfort on his scalp, on Saturday he thought he had razor burn and continues to be on the left hand side of the head. Sleep has been very tough , feel like a flame thrower on the left side of his head For the last 2 days he tried tramadol and did nothing and switching to motrin and during the day that helps, but at night worse.  2008 cerebral aneurysm, no sxs due to this, no vision changes, n/v Right carotid and left carotid artery, possible due to prior injury when he was younger   Past Medical History  Diagnosis Date  . Cerebral aneurysm   . Headache(784.0)     from cerebral aneurysm  2008  . GERD (gastroesophageal reflux disease)     occasional  . Anxiety   . Pneumonia     2010  . Depression     no treatment  . PONV (postoperative nausea and vomiting)   . Allergy    Past Surgical History  Procedure Laterality Date  . Knee arthroscopies      has had this several times  . Stone extraction with basket  12/30/10  . Cerebral aneurysm repair  2010   History   Social History  . Marital Status: Married    Spouse Name: N/A    Number of Children: N/A  . Years of Education: N/A   Social History Main Topics  . Smoking status: Never Smoker   . Smokeless tobacco: None  . Alcohol Use: 0.0 oz/week    2-3 drink(s) per week  . Drug Use: Yes    Special: Marijuana     Comment: 1990s  . Sexual Activity: None   Other Topics Concern  . None   Social History Narrative  . None   Family History  Problem Relation Age of Onset  . Cancer Father     Prostate  . Diabetes Brother    Allergies  Allergen Reactions  . Cheese Anaphylaxis   Prior to Admission medications   Medication Sig Start Date End  Date Taking? Authorizing Provider  diphenhydrAMINE (BENADRYL) 25 MG tablet Take 25 mg by mouth at bedtime as needed. For sleep   Yes Historical Provider, MD  escitalopram (LEXAPRO) 10 MG tablet Take 1 tablet (10 mg total) by mouth daily. 07/31/12  Yes Gordy Savers, MD  traMADol (ULTRAM) 50 MG tablet Take 1 tablet (50 mg total) by mouth every 8 (eight) hours as needed. For pain 05/08/12  Yes Doe-Hyun Sherran Needs, DO  acetaminophen (TYLENOL) 500 MG tablet Take 1,000 mg by mouth every 6 (six) hours as needed. For pain     Historical Provider, MD  ALPRAZolam Prudy Feeler) 0.25 MG tablet Take 1 tablet (0.25 mg total) by mouth 2 (two) times daily as needed for sleep. 07/31/12   Gordy Savers, MD  azithromycin (ZITHROMAX) 250 MG tablet 2 initially then 1 daily for 4 more days 08/23/12   Gordy Savers, MD  doxycycline (VIBRA-TABS) 100 MG tablet Take 1 tablet (100 mg total) by mouth 2 (two) times daily. 06/20/13   Kelle Darting, NP  Polyethyl Glycol-Propyl Glycol (SYSTANE OP) Place 1 drop into both  eyes at bedtime.      Historical Provider, MD  Polyethylene Glycol 400 (BLINK TEARS OP) Place 1 drop into both eyes 3 (three) times daily as needed. For dry eyes     Historical Provider, MD  predniSONE (DELTASONE) 20 MG tablet 40mg  (2 tablets daily) for 5 days, then 20mg  (1 tablet) daily for 3 days, then 1/2 tablet daily for 4 days 09/24/12   Terressa Koyanagi, DO     ROS: The patient denies fevers, chills, night sweats, unintentional weight loss, chest pain, palpitations, wheezing, dyspnea on exertion, nausea, vomiting, abdominal pain, dysuria, hematuria, melena, numbness, weakness, or tingling.   All other systems have been reviewed and were otherwise negative with the exception of those mentioned in the HPI and as above.    PHYSICAL EXAM: Filed Vitals:   07/18/13 2007  BP: 126/82  Pulse: 92  Temp: 99.2 F (37.3 C)  Resp: 20   Filed Vitals:   07/18/13 2007  Height: 5' 9.5" (1.765 m)  Weight: 224 lb  3.2 oz (101.696 kg)   Body mass index is 32.64 kg/(m^2).  General: Alert, no acute distress HEENT:  Normocephalic, atraumatic, oropharynx patent. EOMI, PERRLA Cardiovascular:  Regular rate and rhythm, no rubs murmurs or gallops.  No Carotid bruits, radial pulse intact. No pedal edema.  Respiratory: Clear to auscultation bilaterally.  No wheezes, rales, or rhonchi.  No cyanosis, no use of accessory musculature GI: No organomegaly, abdomen is soft and non-tender, positive bowel sounds.  No masses. Skin: + vesicular rash on left side of scalp  Neurologic: Facial musculature symmetric. Psychiatric: Patient is appropriate throughout our interaction. Lymphatic: No cervical lymphadenopathy Musculoskeletal: Gait intact.   LABS: Results for orders placed in visit on 02/08/11  CBC WITH DIFFERENTIAL      Result Value Range   WBC 11.2 (*) 4.5 - 10.5 K/uL   RBC 4.97  4.22 - 5.81 Mil/uL   Hemoglobin 15.1  13.0 - 17.0 g/dL   HCT 09.8  11.9 - 14.7 %   MCV 86.4  78.0 - 100.0 fl   MCHC 35.1  30.0 - 36.0 g/dL   RDW 82.9 (*) 56.2 - 13.0 %   Platelets 247.0  150.0 - 400.0 K/uL   Neutrophils Relative % 73.8  43.0 - 77.0 %   Lymphocytes Relative 19.6  12.0 - 46.0 %   Monocytes Relative 4.8  3.0 - 12.0 %   Eosinophils Relative 1.2  0.0 - 5.0 %   Basophils Relative 0.6  0.0 - 3.0 %   Neutro Abs 8.3 (*) 1.4 - 7.7 K/uL   Lymphs Abs 2.2  0.7 - 4.0 K/uL   Monocytes Absolute 0.5  0.1 - 1.0 K/uL   Eosinophils Absolute 0.1  0.0 - 0.7 K/uL   Basophils Absolute 0.1  0.0 - 0.1 K/uL  BASIC METABOLIC PANEL      Result Value Range   Sodium 141  135 - 145 mEq/L   Potassium 4.3  3.5 - 5.1 mEq/L   Chloride 104  96 - 112 mEq/L   CO2 29  19 - 32 mEq/L   Glucose, Bld 82  70 - 99 mg/dL   BUN 17  6 - 23 mg/dL   Creatinine, Ser 1.1  0.4 - 1.5 mg/dL   Calcium 9.8  8.4 - 86.5 mg/dL   GFR 78.46  >96.29 mL/min     EKG/XRAY:   Primary read interpreted by Dr. Conley Rolls at [facility].   ASSESSMENT/PLAN: Encounter Diagnoses   Name Primary?  Marland Kitchen  Shingles rash Yes  . Head pain    Rx Valtrex 1 gram TID Rx Roxicet for pain F/u  prn  Gross sideeffects, risk and benefits, and alternatives of medications d/w patient. Patient is aware that all medications have potential sideeffects and we are unable to predict every sideeffect or drug-drug interaction that may occur.  Hamilton Capri PHUONG, DO 07/18/2013 9:21 PM

## 2013-07-22 ENCOUNTER — Encounter: Payer: Self-pay | Admitting: Internal Medicine

## 2013-07-22 ENCOUNTER — Ambulatory Visit (INDEPENDENT_AMBULATORY_CARE_PROVIDER_SITE_OTHER): Payer: BC Managed Care – PPO | Admitting: Internal Medicine

## 2013-07-22 VITALS — BP 132/90 | HR 96 | Ht 71.0 in | Wt 228.0 lb

## 2013-07-22 DIAGNOSIS — N200 Calculus of kidney: Secondary | ICD-10-CM

## 2013-07-22 DIAGNOSIS — Z8042 Family history of malignant neoplasm of prostate: Secondary | ICD-10-CM

## 2013-07-22 DIAGNOSIS — Z0000 Encounter for general adult medical examination without abnormal findings: Secondary | ICD-10-CM

## 2013-07-22 DIAGNOSIS — I671 Cerebral aneurysm, nonruptured: Secondary | ICD-10-CM

## 2013-07-22 LAB — CBC WITH DIFFERENTIAL/PLATELET
Basophils Absolute: 0 10*3/uL (ref 0.0–0.1)
Basophils Relative: 0.3 % (ref 0.0–3.0)
Eosinophils Absolute: 0.2 10*3/uL (ref 0.0–0.7)
Eosinophils Relative: 2.5 % (ref 0.0–5.0)
HCT: 41.4 % (ref 39.0–52.0)
Hemoglobin: 14.5 g/dL (ref 13.0–17.0)
Lymphocytes Relative: 16.2 % (ref 12.0–46.0)
Lymphs Abs: 1.3 10*3/uL (ref 0.7–4.0)
MCHC: 35 g/dL (ref 30.0–36.0)
MCV: 84 fl (ref 78.0–100.0)
Monocytes Absolute: 0.4 10*3/uL (ref 0.1–1.0)
Monocytes Relative: 5.2 % (ref 3.0–12.0)
Neutro Abs: 5.9 10*3/uL (ref 1.4–7.7)
Neutrophils Relative %: 75.8 % (ref 43.0–77.0)
Platelets: 218 10*3/uL (ref 150.0–400.0)
RBC: 4.92 Mil/uL (ref 4.22–5.81)
RDW: 16.2 % — ABNORMAL HIGH (ref 11.5–14.6)
WBC: 7.8 10*3/uL (ref 4.5–10.5)

## 2013-07-22 LAB — POCT URINALYSIS DIPSTICK
Bilirubin, UA: NEGATIVE
Glucose, UA: NEGATIVE
Ketones, UA: NEGATIVE
Leukocytes, UA: NEGATIVE
Nitrite, UA: NEGATIVE
Protein, UA: NEGATIVE
Spec Grav, UA: 1.025
Urobilinogen, UA: 0.2
pH, UA: 5.5

## 2013-07-22 LAB — LIPID PANEL
Cholesterol: 215 mg/dL — ABNORMAL HIGH (ref 0–200)
HDL: 46.7 mg/dL (ref >39.00)
Total CHOL/HDL Ratio: 5
Triglycerides: 141 mg/dL (ref 0.0–149.0)
VLDL: 28.2 mg/dL (ref 0.0–40.0)

## 2013-07-22 LAB — BASIC METABOLIC PANEL
BUN: 14 mg/dL (ref 6–23)
CO2: 30 mEq/L (ref 19–32)
Calcium: 9.7 mg/dL (ref 8.4–10.5)
Chloride: 102 mEq/L (ref 96–112)
Creatinine, Ser: 1.1 mg/dL (ref 0.4–1.5)
GFR: 78.81 mL/min (ref >60.00)
Glucose, Bld: 91 mg/dL (ref 70–99)
Potassium: 4.5 mEq/L (ref 3.5–5.1)
Sodium: 139 mEq/L (ref 135–145)

## 2013-07-22 LAB — PSA: PSA: 0.86 ng/mL (ref 0.10–4.00)

## 2013-07-22 LAB — HEPATIC FUNCTION PANEL
ALT: 29 U/L (ref 0–53)
AST: 24 U/L (ref 0–37)
Albumin: 4.5 g/dL (ref 3.5–5.2)
Alkaline Phosphatase: 62 U/L (ref 39–117)
Bilirubin, Direct: 0.2 mg/dL (ref 0.0–0.3)
Total Bilirubin: 1.8 mg/dL — ABNORMAL HIGH (ref 0.3–1.2)
Total Protein: 7.5 g/dL (ref 6.0–8.3)

## 2013-07-22 LAB — TSH: TSH: 1.27 u[IU]/mL (ref 0.35–5.50)

## 2013-07-22 MED ORDER — ALPRAZOLAM 0.25 MG PO TABS: 0.2500 mg | ORAL_TABLET | Freq: Two times a day (BID) | ORAL | Status: DC | PRN

## 2013-07-22 MED ORDER — TRAMADOL HCL 50 MG PO TABS: 50.0000 mg | ORAL_TABLET | Freq: Three times a day (TID) | ORAL | Status: DC | PRN

## 2013-07-22 NOTE — Progress Notes (Signed)
cpx Recent shingles- still with pain  Past Medical History  Diagnosis Date  . Cerebral aneurysm   . Headache(784.0)     from cerebral aneurysm  2008  . GERD (gastroesophageal reflux disease)     occasional  . Anxiety   . Pneumonia     2010  . Depression     no treatment  . PONV (postoperative nausea and vomiting)   . Allergy     History   Social History  . Marital Status: Married    Spouse Name: N/A    Number of Children: N/A  . Years of Education: N/A   Occupational History  . Not on file.   Social History Main Topics  . Smoking status: Never Smoker   . Smokeless tobacco: Not on file  . Alcohol Use: 0.0 oz/week    2-3 drink(s) per week  . Drug Use: Yes    Special: Marijuana     Comment: 1990s  . Sexual Activity: Not on file   Other Topics Concern  . Not on file   Social History Narrative  . No narrative on file    Past Surgical History  Procedure Laterality Date  . Knee arthroscopies      has had this several times  . Stone extraction with basket  12/30/10  . Cerebral aneurysm repair  2010    Family History  Problem Relation Age of Onset  . Cancer Father     Prostate  . Diabetes Brother     Allergies  Allergen Reactions  . Cheese Anaphylaxis    Current Outpatient Prescriptions on File Prior to Visit  Medication Sig Dispense Refill  . acetaminophen (TYLENOL) 500 MG tablet Take 1,000 mg by mouth every 6 (six) hours as needed. For pain       . ALPRAZolam (XANAX) 0.25 MG tablet Take 1 tablet (0.25 mg total) by mouth 2 (two) times daily as needed for sleep.  20 tablet  0  . azithromycin (ZITHROMAX) 250 MG tablet 2 initially then 1 daily for 4 more days  6 tablet  0  . diphenhydrAMINE (BENADRYL) 25 MG tablet Take 25 mg by mouth at bedtime as needed. For sleep      . doxycycline (VIBRA-TABS) 100 MG tablet Take 1 tablet (100 mg total) by mouth 2 (two) times daily.  14 tablet  0  . escitalopram (LEXAPRO) 10 MG tablet Take 1 tablet (10 mg total) by  mouth daily.  90 tablet  3  . oxyCODONE-acetaminophen (ROXICET) 5-325 MG per tablet Take 1 tablet by mouth every 4 (four) hours as needed for pain.  20 tablet  0  . Polyethyl Glycol-Propyl Glycol (SYSTANE OP) Place 1 drop into both eyes at bedtime.        . Polyethylene Glycol 400 (BLINK TEARS OP) Place 1 drop into both eyes 3 (three) times daily as needed. For dry eyes       . predniSONE (DELTASONE) 20 MG tablet 40mg  (2 tablets daily) for 5 days, then 20mg  (1 tablet) daily for 3 days, then 1/2 tablet daily for 4 days  15 tablet  0  . traMADol (ULTRAM) 50 MG tablet Take 1 tablet (50 mg total) by mouth every 8 (eight) hours as needed. For pain  60 tablet  3  . valACYclovir (VALTREX) 1000 MG tablet Take 1 tablet (1,000 mg total) by mouth 3 (three) times daily.  21 tablet  0   No current facility-administered medications on file prior to visit.  patient denies chest pain, shortness of breath, orthopnea. Denies lower extremity edema, abdominal pain, change in appetite, change in bowel movements. Patient denies rashes, musculoskeletal complaints. No other specific complaints in a complete review of systems.   Reviewed vitals  well-developed well-nourished male in no acute distress. HEENT exam atraumatic, normocephalic, neck supple without jugular venous distention. Chest clear to auscultation cardiac exam S1-S2 are regular. Abdominal exam overweight with bowel sounds, soft and nontender. Extremities no edema. Neurologic exam is alert with a normal gait. Herpetiform rash- occipital area on left   Well visit- health maint utd

## 2013-07-22 NOTE — Addendum Note (Signed)
Addended by: Lindley Magnus on: 07/22/2013 10:21 AM   Modules accepted: Orders, Medications

## 2013-07-22 NOTE — Assessment & Plan Note (Signed)
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 The distal 4  cm of the right cervical ICA are abnormal. There are areas of way  of disease and/or localized dissections with aneurysmal dilatation.  Maximal diameter of the vessel is 8 mm. On the left, the distal 3  cm of the vessel are involved and there is a saccular aneurysm  and/or localized dissection/web with maximal diameter of 8 mm.

## 2013-07-23 LAB — LDL CHOLESTEROL, DIRECT: Direct LDL: 144.1 mg/dL

## 2013-07-26 NOTE — Progress Notes (Signed)
Quick Note:  Called and spoke with pt and pt is aware. ______ 

## 2013-08-02 ENCOUNTER — Telehealth: Payer: Self-pay | Admitting: Internal Medicine

## 2013-08-02 NOTE — Telephone Encounter (Signed)
Caller: Zabian/Patient; Phone: [phone]; Reason for Call: Patient was seen by Dr.  Cato Mulligan for shingles recently.  Was instructed to call back if pain remained after rash was completely gone.  Patient reports there is no rash at this time, but the pain level remains the same as before.  Please contact patient with further instructions.

## 2013-08-05 MED ORDER — GABAPENTIN 300 MG PO CAPS: ORAL_CAPSULE | ORAL | Status: DC

## 2013-08-05 NOTE — Telephone Encounter (Signed)
Left message for pt to call back  °

## 2013-08-05 NOTE — Telephone Encounter (Signed)
Pt aware, rx sent in electronically 

## 2013-08-05 NOTE — Telephone Encounter (Signed)
Pt returned your call-pls call °

## 2013-08-05 NOTE — Telephone Encounter (Signed)
Probably best to start a medication if pain persists neuronitn 300 mg po bid for 3 days and then increase to neurontin 300 mg po q a.m. And 600 mg po qhs  #270/ 1 refill.  Call in 2 weeks if pain persists Call for any side effects

## 2013-08-08 ENCOUNTER — Telehealth: Payer: Self-pay | Admitting: Internal Medicine

## 2013-08-08 MED ORDER — INDOMETHACIN ER 75 MG PO CPCR: 75.0000 mg | ORAL_CAPSULE | Freq: Two times a day (BID) | ORAL | Status: DC | PRN

## 2013-08-08 NOTE — Telephone Encounter (Signed)
Gouty attack and onset of Neurontin therapy probably a coincidence-okay to refill gout medications

## 2013-08-08 NOTE — Telephone Encounter (Signed)
Could you answer this for Dr Cato Mulligan?

## 2013-08-08 NOTE — Telephone Encounter (Signed)
Caller: John Costa/Patient; Phone: [phone]; Reason for Call: Pt states Dr. Cato Mulligan started him on Gabapentin which he started taking earlier this week on 11/17.  The very next day on 11/18 he began having gout flare symptoms.  States he hasn't had gout symptoms in 4 yrs and wants to know if it just a coincidence or if the Gapapentin could have caused it and if so, should he continue to take the Gabapentin?  He will also need a new script for his gout medication as the bottle he has expired in 2012. Assured him will send message for MD review and someone will call him back by end of day today to f/u with him regarding MD recommendations/instructions. Agreed to plan.

## 2013-08-08 NOTE — Telephone Encounter (Signed)
Pt aware, rx sent in electronically 

## 2013-08-14 ENCOUNTER — Telehealth: Payer: Self-pay | Admitting: Internal Medicine

## 2013-08-14 ENCOUNTER — Ambulatory Visit (INDEPENDENT_AMBULATORY_CARE_PROVIDER_SITE_OTHER): Payer: BC Managed Care – PPO | Admitting: Family Medicine

## 2013-08-14 ENCOUNTER — Encounter: Payer: Self-pay | Admitting: Family Medicine

## 2013-08-14 VITALS — BP 130/90 | HR 90 | Temp 98.0°F | Wt 229.0 lb

## 2013-08-14 DIAGNOSIS — M109 Gout, unspecified: Secondary | ICD-10-CM

## 2013-08-14 DIAGNOSIS — B029 Zoster without complications: Secondary | ICD-10-CM

## 2013-08-14 MED ORDER — PREDNISONE 10 MG PO TABS: ORAL_TABLET | ORAL | Status: DC

## 2013-08-14 MED ORDER — OXYCODONE-ACETAMINOPHEN 5-325 MG PO TABS: 1.0000 | ORAL_TABLET | ORAL | Status: DC | PRN

## 2013-08-14 MED ORDER — COLCHICINE 0.6 MG PO TABS: 0.6000 mg | ORAL_TABLET | Freq: Two times a day (BID) | ORAL | Status: DC

## 2013-08-14 NOTE — Telephone Encounter (Signed)
Patient Information:  Caller Name: Fares  Phone: [phone]  Patient: John, Costa  Gender: Male  DOB: 1966-05-11  Age: 47 Years  PCP: Birdie Sons (Adults only)  Office Follow Up:  Does the office need to follow up with this patient?: No  Instructions For The Office: N/A  RN Note:  Scheduled appointment with Dr. Caryl Never at 16:15.  Symptoms  Reason For Call & Symptoms: Onset 08/07/2013 of gout (left great toe) with Rx called in on 08/08/2013.  On 08/10/2013 went to [facility] for entire left lower leg swelling and received steroid injection and prescribed Prednisone 5-day pack.  The edema improved after steroids but gout toe still painful (rates 7-8/10), reddened, and warm to touch.  Reviewed Health History In EMR: Yes  Reviewed Medications In EMR: Yes  Reviewed Allergies In EMR: Yes  Reviewed Surgeries / Procedures: Yes  Date of Onset of Symptoms: 08/07/2013  Treatments Tried: Indomethacin and above steroids  Treatments Tried Worked: No  Guideline(s) Used:  Foot Pain  Disposition Per Guideline:   See Today in Office  Reason For Disposition Reached:   Severe pain (e.g., excruciating, unable to do any normal activities)  Advice Given:  Call Back If:  You become worse.  Patient Will Follow Care Advice:  YES  Appointment Scheduled:  08/14/2013 16:15:00 Appointment Scheduled Provider:  Evelena Peat [facility])

## 2013-08-14 NOTE — Progress Notes (Signed)
   Subjective:    Patient ID: John Costa, male    DOB: Apr 25, 1966, 47 y.o.   MRN: [id number]  HPI Acute visit for gout left metatarsophalangeal joint. Patient's had involvement of the same joint in the past. Current episode started about one week ago. No injury. He initially took some naproxen and leftover indomethacin without much improvement. This past Saturday he went to a local urgent care and reportedly had Depo-Medrol and oral prednisone taper. He saw some slight improvement initially but at this point seems to be worsening again. He is not aware of any clear dietary triggers. Denies any fever. No chills. Recently went on gabapentin for shingles.  This is the first episode of gout he has had about 3 years-per pt.  Past Medical History  Diagnosis Date  . Cerebral aneurysm   . Headache(784.0)     from cerebral aneurysm  2008  . GERD (gastroesophageal reflux disease)     occasional  . Anxiety   . Pneumonia     2010  . Depression     no treatment  . PONV (postoperative nausea and vomiting)   . Allergy   . Nephrolithiasis 09/19/2010   Past Surgical History  Procedure Laterality Date  . Knee arthroscopies      has had this several times  . Stone extraction with basket  12/30/10  . Cerebral aneurysm repair  2010    reports that he has never smoked. He does not have any smokeless tobacco history on file. He reports that he drinks alcohol. He reports that he uses illicit drugs (Marijuana). family history includes Cancer in his father; Diabetes in his brother. Allergies  Allergen Reactions  . Cheese Anaphylaxis       Review of Systems  Constitutional: Negative for fever, chills and appetite change.  Gastrointestinal: Negative for abdominal pain.       Objective:   Physical Exam  Constitutional: He appears well-developed and well-nourished.  Cardiovascular: Normal rate.   Pulmonary/Chest: Effort normal and breath sounds normal. No respiratory distress. He has no  wheezes. He has no rales.  Musculoskeletal:  Patient has some erythema, warmth, tenderness, and mild edema involving left metatarsophalangeal joint.          Assessment & Plan:  Acute gout involving left metatarsophalangeal joint. He does not have any indicators of infection. Surprising is that he has not seen more prompt improvement with recent prednisone. Start colchicine 0.6 mg twice a day and prednisone taper starting at 60 mg daily and touch base by early next week if not resolving. His episodes of gout have been very infrequent but are becoming more frequent we'll discuss prophylaxis with primary Wrote for limited oxycodone 5 mg for severe pain at night.

## 2013-08-14 NOTE — Progress Notes (Signed)
Pre visit review using our clinic review tool, if applicable. No additional management support is needed unless otherwise documented below in the visit note. 

## 2013-09-29 ENCOUNTER — Other Ambulatory Visit: Payer: Self-pay | Admitting: Family Medicine

## 2013-12-04 ENCOUNTER — Ambulatory Visit (INDEPENDENT_AMBULATORY_CARE_PROVIDER_SITE_OTHER): Payer: BC Managed Care – PPO | Admitting: Family

## 2013-12-04 ENCOUNTER — Encounter: Payer: Self-pay | Admitting: Family

## 2013-12-04 VITALS — BP 120/80 | HR 82 | Temp 98.5°F | Wt 231.0 lb

## 2013-12-04 DIAGNOSIS — M109 Gout, unspecified: Secondary | ICD-10-CM

## 2013-12-04 LAB — CBC WITH DIFFERENTIAL/PLATELET
Basophils Absolute: 0.1 10*3/uL (ref 0.0–0.1)
Basophils Relative: 0.7 % (ref 0.0–3.0)
Eosinophils Absolute: 0.1 10*3/uL (ref 0.0–0.7)
Eosinophils Relative: 1.4 % (ref 0.0–5.0)
HCT: 45.4 % (ref 39.0–52.0)
Hemoglobin: 15.4 g/dL (ref 13.0–17.0)
Lymphocytes Relative: 22.6 % (ref 12.0–46.0)
Lymphs Abs: 1.9 10*3/uL (ref 0.7–4.0)
MCHC: 33.8 g/dL (ref 30.0–36.0)
MCV: 85.4 fl (ref 78.0–100.0)
Monocytes Absolute: 0.4 10*3/uL (ref 0.1–1.0)
Monocytes Relative: 5.2 % (ref 3.0–12.0)
Neutro Abs: 6 10*3/uL (ref 1.4–7.7)
Neutrophils Relative %: 70.1 % (ref 43.0–77.0)
Platelets: 272 10*3/uL (ref 150.0–400.0)
RBC: 5.32 Mil/uL (ref 4.22–5.81)
RDW: 15.8 % — ABNORMAL HIGH (ref 11.5–14.6)
WBC: 8.6 10*3/uL (ref 4.5–10.5)

## 2013-12-04 LAB — URIC ACID: Uric Acid, Serum: 9.4 mg/dL — ABNORMAL HIGH (ref 4.0–7.8)

## 2013-12-04 MED ORDER — COLCHICINE 0.6 MG PO TABS: ORAL_TABLET | ORAL | Status: DC

## 2013-12-04 MED ORDER — NAPROXEN 500 MG PO TABS
500.0000 mg | ORAL_TABLET | Freq: Two times a day (BID) | ORAL | Status: DC
Start: 2013-12-04 — End: 2014-11-26

## 2013-12-04 NOTE — Progress Notes (Signed)
Pre visit review using our clinic review tool, if applicable. No additional management support is needed unless otherwise documented below in the visit note. 

## 2013-12-04 NOTE — Progress Notes (Signed)
Subjective:    Patient ID: John Costa, male    DOB: 1966-05-09, 48 y.o.   MRN: [id number]  HPI 48 year old white male, nonsmoker is in today with complaints of increase in gout flares since November. Reports he typically has a flare twice a year but has been more frequent recently. He is actively in a flare and is to take Anaprox and ankle brace that help. Reports his pain being a 4/10 worse with movement. Takes naproxen and colcrys.    Review of Systems  Constitutional: Negative.   HENT: Negative.   Respiratory: Negative.   Cardiovascular: Negative.   Endocrine: Negative.   Genitourinary: Negative.   Musculoskeletal: Positive for arthralgias.       Right great toe pain   Skin: Negative.   Neurological: Negative.   Psychiatric/Behavioral: Negative.    Past Medical History  Diagnosis Date  . Cerebral aneurysm   . Headache(784.0)     from cerebral aneurysm  2008  . GERD (gastroesophageal reflux disease)     occasional  . Anxiety   . Pneumonia     2010  . Depression     no treatment  . PONV (postoperative nausea and vomiting)   . Allergy   . Nephrolithiasis 09/19/2010    History   Social History  . Marital Status: Married    Spouse Name: John Costa    Number of Children: John Costa  . Years of Education: John Costa   Occupational History  . Not on file.   Social History Main Topics  . Smoking status: Never Smoker   . Smokeless tobacco: Not on file  . Alcohol Use: 0.0 oz/week    2-3 drink(s) per week  . Drug Use: Yes    Special: Marijuana     Comment: 1990s  . Sexual Activity: Not on file   Other Topics Concern  . Not on file   Social History Narrative  . No narrative on file    Past Surgical History  Procedure Laterality Date  . Knee arthroscopies      has had this several times  . Stone extraction with basket  12/30/10  . Cerebral aneurysm repair  2010    Family History  Problem Relation Age of Onset  . Cancer Father     Prostate  . Diabetes Brother      Allergies  Allergen Reactions  . Cheese Anaphylaxis    Current Outpatient Prescriptions on File Prior to Visit  Medication Sig Dispense Refill  . diphenhydrAMINE (BENADRYL) 25 MG tablet Take 25 mg by mouth at bedtime as needed. For sleep      . Polyethyl Glycol-Propyl Glycol (SYSTANE OP) Place 1 drop into both eyes at bedtime.        . traMADol (ULTRAM) 50 MG tablet Take 1 tablet (50 mg total) by mouth every 8 (eight) hours as needed. For pain  60 tablet  3   No current facility-administered medications on file prior to visit.    BP 120/80  Pulse 82  Temp(Src) 98.5 F (36.9 C) (Oral)  Wt 231 lb (104.781 kg)chart    Objective:   Physical Exam  Constitutional: He is oriented to person, place, and time. He appears well-developed and well-nourished.  Neck: Normal range of motion. Neck supple.  Cardiovascular: Normal rate, regular rhythm and normal heart sounds.   Pulmonary/Chest: Effort normal and breath sounds normal.  Musculoskeletal: He exhibits edema and tenderness.  Right great toe redness and swelling.   Neurological: He is alert and oriented  to person, place, and time.  Skin: Skin is warm and dry.  Psychiatric: He has a normal mood and affect.          Assessment & Plan:  Quanta was seen today for gout.  Diagnoses and associated orders for this visit:  Gout attack - Uric Acid - CBC with Differential  Other Orders - naproxen (NAPROSYN) 500 MG tablet; Take 1 tablet (500 mg total) by mouth 2 (two) times daily with a meal. - colchicine (COLCRYS) 0.6 MG tablet; TAKE 1 TABLET BY MOUTH TWICE DAILY   Call with any questions or concerns. Recheck as scheduled and as needed.

## 2013-12-04 NOTE — Patient Instructions (Signed)

## 2013-12-06 ENCOUNTER — Other Ambulatory Visit: Payer: Self-pay | Admitting: Family

## 2013-12-06 MED ORDER — ALLOPURINOL 100 MG PO TABS: 100.0000 mg | ORAL_TABLET | Freq: Every day | ORAL | Status: DC

## 2014-03-12 ENCOUNTER — Ambulatory Visit (INDEPENDENT_AMBULATORY_CARE_PROVIDER_SITE_OTHER): Payer: BC Managed Care – PPO | Admitting: Family

## 2014-03-12 ENCOUNTER — Encounter: Payer: Self-pay | Admitting: Family

## 2014-03-12 VITALS — BP 124/90 | HR 114 | Temp 98.5°F | Wt 227.0 lb

## 2014-03-12 DIAGNOSIS — F410 Panic disorder [episodic paroxysmal anxiety] without agoraphobia: Secondary | ICD-10-CM

## 2014-03-12 MED ORDER — PROPRANOLOL HCL 10 MG PO TABS: 10.0000 mg | ORAL_TABLET | Freq: Every day | ORAL | Status: DC

## 2014-03-12 NOTE — Progress Notes (Signed)
Pre visit review using our clinic review tool, if applicable. No additional management support is needed unless otherwise documented below in the visit note. 

## 2014-03-12 NOTE — Progress Notes (Signed)
Subjective:    Patient ID: John Costa, male    DOB: 02-Jul-1966, 48 y.o.   MRN: [id number]  HPI  48 year old white male, nonsmoker is in today with complaints of panic attacks happen infrequently. However, over the last 3-4 months he's had 2-3 panic attacks that typically occur when he's driving on the highway and over bridges. He is a long-standing history of panic attacks that occur infrequently. Denies anxiety or depression. Has been on SSRIs in the past that caused sexual side effects and inability to ejaculate. Xanax cause drowsiness.  Review of Systems  Constitutional: Negative.   Respiratory: Negative.   Cardiovascular: Negative.   Endocrine: Negative.   Genitourinary: Negative.   Musculoskeletal: Negative.   Hematological: Negative.   Psychiatric/Behavioral: Negative for suicidal ideas, confusion and agitation. The patient is nervous/anxious.    Past Medical History  Diagnosis Date  . Cerebral aneurysm   . Headache(784.0)     from cerebral aneurysm  2008  . GERD (gastroesophageal reflux disease)     occasional  . Anxiety   . Pneumonia     2010  . Depression     no treatment  . PONV (postoperative nausea and vomiting)   . Allergy   . Nephrolithiasis 09/19/2010    History   Social History  . Marital Status: Married    Spouse Name: N/A    Number of Children: N/A  . Years of Education: N/A   Occupational History  . Not on file.   Social History Main Topics  . Smoking status: Never Smoker   . Smokeless tobacco: Not on file  . Alcohol Use: 0.0 oz/week    2-3 drink(s) per week  . Drug Use: Yes    Special: Marijuana     Comment: 1990s  . Sexual Activity: Not on file   Other Topics Concern  . Not on file   Social History Narrative  . No narrative on file    Past Surgical History  Procedure Laterality Date  . Knee arthroscopies      has had this several times  . Stone extraction with basket  12/30/10  . Cerebral aneurysm repair  2010     Family History  Problem Relation Age of Onset  . Cancer Father     Prostate  . Diabetes Brother     Allergies  Allergen Reactions  . Cheese Anaphylaxis    Current Outpatient Prescriptions on File Prior to Visit  Medication Sig Dispense Refill  . allopurinol (ZYLOPRIM) 100 MG tablet Take 1 tablet (100 mg total) by mouth daily.  30 tablet  6  . colchicine (COLCRYS) 0.6 MG tablet TAKE 1 TABLET BY MOUTH TWICE DAILY  60 tablet  1  . diphenhydrAMINE (BENADRYL) 25 MG tablet Take 25 mg by mouth at bedtime as needed. For sleep      . naproxen (NAPROSYN) 500 MG tablet Take 1 tablet (500 mg total) by mouth 2 (two) times daily with a meal.  60 tablet  2  . Polyethyl Glycol-Propyl Glycol (SYSTANE OP) Place 1 drop into both eyes at bedtime.        . traMADol (ULTRAM) 50 MG tablet Take 1 tablet (50 mg total) by mouth every 8 (eight) hours as needed. For pain  60 tablet  3   No current facility-administered medications on file prior to visit.    BP 124/90  Pulse 114  Temp(Src) 98.5 F (36.9 C) (Oral)  Wt 227 lb (102.967 kg)  SpO2 97%chart  Objective:   Physical Exam  Constitutional: He is oriented to person, place, and time. He appears well-developed and well-nourished.  Neck: Normal range of motion. Neck supple.  Cardiovascular: Normal rate, regular rhythm and normal heart sounds.   Pulmonary/Chest: Effort normal and breath sounds normal.  Musculoskeletal: Normal range of motion.  Neurological: He is alert and oriented to person, place, and time.  Skin: Skin is warm and dry.  Psychiatric: He has a normal mood and affect.          Assessment & Plan:  John Costa was seen today for panic attack.  Diagnoses and associated orders for this visit:  Panic attack  Other Orders - propranolol (INDERAL) 10 MG tablet; Take 1 tablet (10 mg total) by mouth daily.    follow up in 3 weeks and sooner as needed for reevaluation of panic attacks.

## 2014-03-12 NOTE — Patient Instructions (Signed)

## 2014-03-17 ENCOUNTER — Encounter: Payer: Self-pay | Admitting: Family

## 2014-03-18 ENCOUNTER — Other Ambulatory Visit: Payer: Self-pay | Admitting: Family

## 2014-03-18 MED ORDER — CLONAZEPAM 0.5 MG PO TABS
0.5000 mg | ORAL_TABLET | Freq: Two times a day (BID) | ORAL | Status: DC | PRN
Start: 2014-03-18 — End: 2015-07-03

## 2014-03-18 MED ORDER — ESCITALOPRAM OXALATE 10 MG PO TABS: 10.0000 mg | ORAL_TABLET | Freq: Every day | ORAL | Status: DC

## 2014-05-29 ENCOUNTER — Ambulatory Visit (INDEPENDENT_AMBULATORY_CARE_PROVIDER_SITE_OTHER): Payer: BC Managed Care – PPO | Admitting: Physician Assistant

## 2014-05-29 ENCOUNTER — Encounter: Payer: Self-pay | Admitting: Physician Assistant

## 2014-05-29 VITALS — BP 118/80 | HR 60 | Temp 98.5°F | Resp 18 | Wt 227.1 lb

## 2014-05-29 DIAGNOSIS — N508 Other specified disorders of male genital organs: Secondary | ICD-10-CM

## 2014-05-29 DIAGNOSIS — N5089 Other specified disorders of the male genital organs: Secondary | ICD-10-CM

## 2014-05-29 NOTE — Progress Notes (Signed)
Pre visit review using our clinic review tool, if applicable. No additional management support is needed unless otherwise documented below in the visit note. 

## 2014-05-29 NOTE — Progress Notes (Signed)
Subjective:    Patient ID: John Costa, male    DOB: Oct 01, 1965, 48 y.o.   MRN: [id number]  Testicle Pain The patient's primary symptoms include testicular pain. The patient's pertinent negatives include no genital injury, genital itching, genital lesions, pelvic pain, penile discharge, penile pain, priapism or scrotal swelling. This is a new problem. The current episode started today. The problem occurs constantly. The problem has been unchanged. The pain is mild. Pertinent negatives include no abdominal pain, anorexia, chest pain, chills, constipation, coughing, diarrhea, discolored urine, dysuria, fever, flank pain, frequency, headaches, hematuria, hesitancy, joint pain, joint swelling, nausea, painful intercourse, rash, shortness of breath, sore throat, urgency, urinary retention or vomiting. The testicular pain affects the left testicle. There is swelling in the left testicle. The color of the testicles is normal. The symptoms are aggravated by urination and tactile pressure. He has tried nothing for the symptoms. His sexual activity is non-contributory to the current illness. His past medical history is significant for kidney stones (left kidney is located in pelvis). There is no history of BPH, cryptorchidism, erectile dysfunction, a femoral hernia, an inguinal hernia, prostatitis or varicocele.      Review of Systems  Constitutional: Negative for fever and chills.  HENT: Negative for sore throat.   Respiratory: Negative for cough and shortness of breath.   Cardiovascular: Negative for chest pain.  Gastrointestinal: Negative for nausea, vomiting, abdominal pain, diarrhea, constipation and anorexia.  Genitourinary: Positive for testicular pain. Negative for dysuria, hesitancy, urgency, frequency, flank pain, discharge, scrotal swelling, penile pain and pelvic pain.  Musculoskeletal: Negative for joint pain.  Skin: Negative for rash.  Neurological: Negative for syncope and  headaches.  All other systems reviewed and are negative.     Past Medical History  Diagnosis Date  . Cerebral aneurysm   . Headache(784.0)     from cerebral aneurysm  2008  . GERD (gastroesophageal reflux disease)     occasional  . Anxiety   . Pneumonia     2010  . Depression     no treatment  . PONV (postoperative nausea and vomiting)   . Allergy   . Nephrolithiasis 09/19/2010    History   Social History  . Marital Status: Married    Spouse Name: N/A    Number of Children: N/A  . Years of Education: N/A   Occupational History  . Not on file.   Social History Main Topics  . Smoking status: Never Smoker   . Smokeless tobacco: Not on file  . Alcohol Use: 0.0 oz/week    2-3 drink(s) per week  . Drug Use: Yes    Special: Marijuana     Comment: 1990s  . Sexual Activity: Not on file   Other Topics Concern  . Not on file   Social History Narrative  . No narrative on file    Past Surgical History  Procedure Laterality Date  . Knee arthroscopies      has had this several times  . Stone extraction with basket  12/30/10  . Cerebral aneurysm repair  2010    Family History  Problem Relation Age of Onset  . Cancer Father     Prostate  . Diabetes Brother     Allergies  Allergen Reactions  . Cheese Anaphylaxis    Current Outpatient Prescriptions on File Prior to Visit  Medication Sig Dispense Refill  . allopurinol (ZYLOPRIM) 100 MG tablet Take 1 tablet (100 mg total) by mouth daily.  30 tablet  6  . clonazePAM (KLONOPIN) 0.5 MG tablet Take 1 tablet (0.5 mg total) by mouth 2 (two) times daily as needed for anxiety.  30 tablet  1  . colchicine (COLCRYS) 0.6 MG tablet TAKE 1 TABLET BY MOUTH TWICE DAILY  60 tablet  1  . diphenhydrAMINE (BENADRYL) 25 MG tablet Take 25 mg by mouth at bedtime as needed. For sleep      . escitalopram (LEXAPRO) 10 MG tablet Take 1 tablet (10 mg total) by mouth daily.  30 tablet  3  . naproxen (NAPROSYN) 500 MG tablet Take 1 tablet  (500 mg total) by mouth 2 (two) times daily with a meal.  60 tablet  2  . Polyethyl Glycol-Propyl Glycol (SYSTANE OP) Place 1 drop into both eyes at bedtime.        . traMADol (ULTRAM) 50 MG tablet Take 1 tablet (50 mg total) by mouth every 8 (eight) hours as needed. For pain  60 tablet  3   No current facility-administered medications on file prior to visit.    EXAM: BP 118/80  Pulse 60  Temp(Src) 98.5 F (36.9 C) (Oral)  Resp 18  Wt 227 lb 1.6 oz (103.012 kg)      Objective:   Physical Exam  Nursing note and vitals reviewed. Constitutional: He is oriented to person, place, and time. He appears well-developed and well-nourished. No distress.  HENT:  Head: Normocephalic and atraumatic.  Eyes: Conjunctivae and EOM are normal.  Cardiovascular: Normal rate, regular rhythm, normal heart sounds and intact distal pulses.   Pulmonary/Chest: Effort normal and breath sounds normal. No respiratory distress. He exhibits no tenderness.  No CVA ttp.  Genitourinary:  No hernia on exam. Solitary, firm, pea-sized lump felt in the scrotum on the left side. This is superior to and completely separate from the testicle and epididymis. This is mildly ttp. No surrounding swelling.  Neurological: He is alert and oriented to person, place, and time.  Skin: Skin is warm and dry. He is not diaphoretic. No pallor.  Psychiatric: He has a normal mood and affect. His behavior is normal. Judgment and thought content normal.     Lab Results  Component Value Date   WBC 8.6 12/04/2013   HGB 15.4 12/04/2013   HCT 45.4 12/04/2013   PLT 272.0 12/04/2013   GLUCOSE 91 07/22/2013   CHOL 215* 07/22/2013   TRIG 141.0 07/22/2013   HDL 46.70 07/22/2013   LDLDIRECT 144.1 07/22/2013   ALT 29 07/22/2013   AST 24 07/22/2013   NA 139 07/22/2013   K 4.5 07/22/2013   CL 102 07/22/2013   CREATININE 1.1 07/22/2013   BUN 14 07/22/2013   CO2 30 07/22/2013   TSH 1.27 07/22/2013   PSA 0.86 07/22/2013   INR 1.1 ratio* 01/29/2010         Assessment & Plan:  Aldin was seen today for lump on testicle.  Diagnoses and associated orders for this visit:  Lump in scrotum Comments: Separate from testicle, varicocele vs spermatocele? Due to pain, will have pt follow up with urology to evaluate.    Return precautions provided, and patient handout on scrotal masses.  Plan to follow up as needed, or for worsening or persistent symptoms despite treatment.  Patient Instructions  Call your urologist office to schedule an appointment to evaluate this lump in your scrotum due to the amount of pain that you're in.  If emergency symptoms discussed during visit developed, seek medical attention immediately.  Followup as needed, or  for worsening or persistent symptoms despite treatment.

## 2014-05-29 NOTE — Patient Instructions (Addendum)
Call your urologist office to schedule an appointment to evaluate this lump in your scrotum due to the amount of pain that you're in.  If emergency symptoms discussed during visit developed, seek medical attention immediately.  Followup as needed, or for worsening or persistent symptoms despite treatment.    Scrotal Masses Scrotal swelling is common in men of all ages. Common types of testicular masses include:   Hydrocele. The most common benign testicular mass in an adult. Hydroceles are generally soft and painless collections of fluid in the scrotal sac. These can rapidly change size as the fluid enters or leaves. Hydroceles can be associated with an underlying cancer of the testicle.  Spermatoceles. Generally soft and painless cyst-like masses in the scrotum that contain fluid, usually above the testicle. They can rapidly change size as the fluid enters or leaves. They are more prominent while standing or exercising. Sometimes, spermatoceles may cause a sensation of heaviness or a dull ache.  Orchitis. Inflammation of the testicle. It is painful and may be associated with a fever or symptoms of a urinary tract infection, including frequent and painful urination. It is common in males who have the mumps.  Varicocele. An enlargement of the veins that drain the testicles. Varicoceles usually occur on the left side of the scrotum. This condition can increase the risk of infertility. Varicocele is sometimes more prominent while standing or exercising. Sometimes, varicoceles may cause a sensation of heaviness or a dull ache.  Inguinal hernia. A bulge caused by a portion of intestine protruding into the scrotum through a weak area in the abdominal muscles. Hernias may or may not be painful. They are soft and usually enlarge with coughing or straining.  Torsion of the testis. This can cause a testicular mass that develops quickly and is associated with tenderness or fever, or both. It is caused by a  twisting of the testicle within the sac. It also reduces the blood supply and can destroy the testis if not treated quickly with surgery.  Epididymitis. Inflammation of the epididymis (a structure attached above and behind the testicle), usually caused by a urinary tract infection or a sexually transmitted infection. This generally shows up as testicular discomfort and swelling and may include pain during urination. It is frequently associated with a testicle infection.  Testicular appendages. Remnants of tissue on the testis present since birth. A testicular appendage can twist on its blood supply and cause pain. In most cases, this is seen as a blue dot on the scrotum.  Hematocele. A collection of blood between the layers of the sac inside the scrotum. It usually is caused by trauma to the scrotum.  Sebaceous cysts. These can be a swelling in the skin of the scrotum and are usually painless.  Cancer (carcinoma) of the skin of the scrotum. It can cause scrotal swelling, but this is rare. Document Released: 03/12/2003 Document Revised: 05/08/2013 Document Reviewed: 02/25/2013 [facility] Patient Information 2015 [place], [place]. This information is not intended to replace advice given to you by your health care provider. Make sure you discuss any questions you have with your health care provider.

## 2014-05-30 ENCOUNTER — Encounter: Payer: Self-pay | Admitting: Internal Medicine

## 2014-06-23 ENCOUNTER — Encounter: Payer: Self-pay | Admitting: Internal Medicine

## 2014-06-23 ENCOUNTER — Ambulatory Visit (INDEPENDENT_AMBULATORY_CARE_PROVIDER_SITE_OTHER): Payer: BC Managed Care – PPO | Admitting: Internal Medicine

## 2014-06-23 VITALS — BP 130/90 | HR 89 | Temp 98.3°F | Resp 20 | Ht 71.0 in | Wt 223.0 lb

## 2014-06-23 DIAGNOSIS — M549 Dorsalgia, unspecified: Secondary | ICD-10-CM

## 2014-06-23 MED ORDER — CYCLOBENZAPRINE HCL 10 MG PO TABS: 10.0000 mg | ORAL_TABLET | Freq: Three times a day (TID) | ORAL | Status: DC | PRN

## 2014-06-23 MED ORDER — METHYLPREDNISOLONE ACETATE 80 MG/ML IJ SUSP
80.0000 mg | Freq: Once | INTRAMUSCULAR | Status: AC
  Administered 2014-06-23: 80 mg via INTRAMUSCULAR

## 2014-06-23 NOTE — Progress Notes (Signed)
Subjective:    Patient ID: John Costa, male    DOB: 1966-06-04, 48 y.o.   MRN: [id number]  HPI  48 year old patient, who presents today with a chief complaint of low back pain for 3 days' duration.  He awoke Saturday morning with intense back pain.  Pain is aggravated by movement and alleviated by rest.  He has had benefit from a heating pad.  No radicular symptoms He has had some episodes of minor low back strain in the past with overuse, but this episode was spontaneous without obvious precipitating factors.  He does have a history of gout and carotid artery aneurysm and fibromuscular dysplasia.  He is followed at [facility]  Past Medical History  Diagnosis Date  . Cerebral aneurysm   . Headache(784.0)     from cerebral aneurysm  2008  . GERD (gastroesophageal reflux disease)     occasional  . Anxiety   . Pneumonia     2010  . Depression     no treatment  . PONV (postoperative nausea and vomiting)   . Allergy   . Nephrolithiasis 09/19/2010    History   Social History  . Marital Status: Married    Spouse Name: N/A    Number of Children: N/A  . Years of Education: N/A   Occupational History  . Not on file.   Social History Main Topics  . Smoking status: Never Smoker   . Smokeless tobacco: Not on file  . Alcohol Use: 0.0 oz/week    2-3 drink(s) per week  . Drug Use: Yes    Special: Marijuana     Comment: 1990s  . Sexual Activity: Not on file   Other Topics Concern  . Not on file   Social History Narrative  . No narrative on file    Past Surgical History  Procedure Laterality Date  . Knee arthroscopies      has had this several times  . Stone extraction with basket  12/30/10  . Cerebral aneurysm repair  2010    Family History  Problem Relation Age of Onset  . Cancer Father     Prostate  . Diabetes Brother     Allergies  Allergen Reactions  . Cheese Anaphylaxis    Current Outpatient Prescriptions on File Prior to Visit  Medication Sig  Dispense Refill  . allopurinol (ZYLOPRIM) 100 MG tablet Take 1 tablet (100 mg total) by mouth daily.  30 tablet  6  . clonazePAM (KLONOPIN) 0.5 MG tablet Take 1 tablet (0.5 mg total) by mouth 2 (two) times daily as needed for anxiety.  30 tablet  1  . colchicine (COLCRYS) 0.6 MG tablet TAKE 1 TABLET BY MOUTH TWICE DAILY  60 tablet  1  . diphenhydrAMINE (BENADRYL) 25 MG tablet Take 25 mg by mouth at bedtime as needed. For sleep      . escitalopram (LEXAPRO) 10 MG tablet Take 1 tablet (10 mg total) by mouth daily.  30 tablet  3  . naproxen (NAPROSYN) 500 MG tablet Take 1 tablet (500 mg total) by mouth 2 (two) times daily with a meal.  60 tablet  2  . Polyethyl Glycol-Propyl Glycol (SYSTANE OP) Place 1 drop into both eyes at bedtime.        . traMADol (ULTRAM) 50 MG tablet Take 1 tablet (50 mg total) by mouth every 8 (eight) hours as needed. For pain  60 tablet  3   No current facility-administered medications on file prior to visit.  BP 130/90  Pulse 89  Temp(Src) 98.3 F (36.8 C) (Oral)  Resp 20  Ht 5\' 11"  (1.803 m)  Wt 223 lb (101.152 kg)  BMI 31.12 kg/m2  SpO2 98%     Review of Systems  Constitutional: Negative for fever, chills, appetite change and fatigue.  HENT: Negative for congestion, dental problem, ear pain, hearing loss, sore throat, tinnitus, trouble swallowing and voice change.   Eyes: Negative for pain, discharge and visual disturbance.  Respiratory: Negative for cough, chest tightness, wheezing and stridor.   Cardiovascular: Negative for chest pain, palpitations and leg swelling.  Gastrointestinal: Negative for nausea, vomiting, abdominal pain, diarrhea, constipation, blood in stool and abdominal distention.  Genitourinary: Negative for urgency, hematuria, flank pain, discharge, difficulty urinating and genital sores.  Musculoskeletal: Positive for back pain. Negative for arthralgias, gait problem, joint swelling, myalgias and neck stiffness.  Skin: Negative for  rash.  Neurological: Negative for dizziness, syncope, speech difficulty, weakness, numbness and headaches.  Hematological: Negative for adenopathy. Does not bruise/bleed easily.  Psychiatric/Behavioral: Negative for behavioral problems and dysphoric mood. The patient is not nervous/anxious.        Objective:   Physical Exam  Constitutional: He appears well-developed and well-nourished. No distress.  Musculoskeletal:  The patient is quite uncomfortable in a slightly stooped posture. Mild tenderness of the lumbar spine Able to walk on his heels without difficulty Increased pain with walking on toes          Assessment & Plan:   Acute low back pain.  We'll treat with Depo-Medrol 80.  We'll continue anti-inflammatories, analgesics.  Will and Flexeril, 10 mg 3 times a day as needed

## 2014-06-23 NOTE — Progress Notes (Signed)
Pre visit review using our clinic review tool, if applicable. No additional management support is needed unless otherwise documented below in the visit note. 

## 2014-06-23 NOTE — Patient Instructions (Signed)
You  may move around, but avoid painful motions and activities.  Apply heat  to the sore area for 15 to 20 minutes 3 or 4 times daily for the next two to 3 days.Back Pain, Adult Low back pain is very common. About 1 in 5 people have back pain.The cause of low back pain is rarely dangerous. The pain often gets better over time.About half of people with a sudden onset of back pain feel better in just 2 weeks. About 8 in 10 people feel better by 6 weeks.  CAUSES Some common causes of back pain include:  Strain of the muscles or ligaments supporting the spine.  Wear and tear (degeneration) of the spinal discs.  Arthritis.  Direct injury to the back. DIAGNOSIS Most of the time, the direct cause of low back pain is not known.However, back pain can be treated effectively even when the exact cause of the pain is unknown.Answering your caregiver's questions about your overall health and symptoms is one of the most accurate ways to make sure the cause of your pain is not dangerous. If your caregiver needs more information, he or she may order lab work or imaging tests (X-rays or MRIs).However, even if imaging tests show changes in your back, this usually does not require surgery. HOME CARE INSTRUCTIONS For many people, back pain returns.Since low back pain is rarely dangerous, it is often a condition that people can learn to [facility] their own.   Remain active. It is stressful on the back to sit or stand in one place. Do not sit, drive, or stand in one place for more than 30 minutes at a time. Take short walks on level surfaces as soon as pain allows.Try to increase the length of time you walk each day.  Do not stay in bed.Resting more than 1 or 2 days can delay your recovery.  Do not avoid exercise or work.Your body is made to move.It is not dangerous to be active, even though your back may hurt.Your back will likely heal faster if you return to being active before your pain is gone.  Pay  attention to your body when you bend and lift. Many people have less discomfortwhen lifting if they bend their knees, keep the load close to their bodies,and avoid twisting. Often, the most comfortable positions are those that put less stress on your recovering back.  Find a comfortable position to sleep. Use a firm mattress and lie on your side with your knees slightly bent. If you lie on your back, put a pillow under your knees.  Only take over-the-counter or prescription medicines as directed by your caregiver. Over-the-counter medicines to reduce pain and inflammation are often the most helpful.Your caregiver may prescribe muscle relaxant drugs.These medicines help dull your pain so you can more quickly return to your normal activities and healthy exercise.  Put ice on the injured area.  Put ice in a plastic bag.  Place a towel between your skin and the bag.  Leave the ice on for 15-20 minutes, 03-04 times a day for the first 2 to 3 days. After that, ice and heat may be alternated to reduce pain and spasms.  Ask your caregiver about trying back exercises and gentle massage. This may be of some benefit.  Avoid feeling anxious or stressed.Stress increases muscle tension and can worsen back pain.It is important to recognize when you are anxious or stressed and learn ways to manage it.Exercise is a great option. SEEK MEDICAL CARE IF:  You have pain that is not relieved with rest or medicine.  You have pain that does not improve in 1 week.  You have new symptoms.  You are generally not feeling well. SEEK IMMEDIATE MEDICAL CARE IF:   You have pain that radiates from your back into your legs.  You develop new bowel or bladder control problems.  You have unusual weakness or numbness in your arms or legs.  You develop nausea or vomiting.  You develop abdominal pain.  You feel faint. Document Released: 09/05/2005 Document Revised: 03/06/2012 Document Reviewed:  01/07/2014 [facility] Patient Information 2015 [place], [place]. This information is not intended to replace advice given to you by your health care provider. Make sure you discuss any questions you have with your health care provider.

## 2014-07-24 ENCOUNTER — Other Ambulatory Visit: Payer: Self-pay | Admitting: Family

## 2014-07-24 NOTE — Telephone Encounter (Signed)
Pt has appt to est with [facility]. Message routed to [facility]

## 2014-08-04 ENCOUNTER — Other Ambulatory Visit: Payer: Self-pay | Admitting: Family

## 2014-08-04 NOTE — Telephone Encounter (Signed)
Pt will establish with [facility] on 3.9.16. Pls advise.

## 2014-08-16 ENCOUNTER — Other Ambulatory Visit: Payer: Self-pay | Admitting: Family

## 2014-11-26 ENCOUNTER — Encounter: Payer: Self-pay | Admitting: Family Medicine

## 2014-11-26 ENCOUNTER — Ambulatory Visit (INDEPENDENT_AMBULATORY_CARE_PROVIDER_SITE_OTHER): Payer: BC Managed Care – PPO | Admitting: Family Medicine

## 2014-11-26 VITALS — BP 110/80 | HR 101 | Temp 98.3°F | Wt 230.0 lb

## 2014-11-26 DIAGNOSIS — J309 Allergic rhinitis, unspecified: Secondary | ICD-10-CM | POA: Insufficient documentation

## 2014-11-26 DIAGNOSIS — Z7251 High risk heterosexual behavior: Secondary | ICD-10-CM

## 2014-11-26 DIAGNOSIS — Z0000 Encounter for general adult medical examination without abnormal findings: Secondary | ICD-10-CM

## 2014-11-26 DIAGNOSIS — M1009 Idiopathic gout, multiple sites: Secondary | ICD-10-CM

## 2014-11-26 DIAGNOSIS — L989 Disorder of the skin and subcutaneous tissue, unspecified: Secondary | ICD-10-CM | POA: Insufficient documentation

## 2014-11-26 DIAGNOSIS — F410 Panic disorder [episodic paroxysmal anxiety] without agoraphobia: Secondary | ICD-10-CM | POA: Insufficient documentation

## 2014-11-26 MED ORDER — BUPROPION HCL ER (XL) 150 MG PO TB24: 150.0000 mg | ORAL_TABLET | Freq: Every day | ORAL | Status: DC

## 2014-11-26 NOTE — Patient Instructions (Addendum)
For panic attacks and history depression  Change to wellbutrin 150mg  XL  Take 1/2 tab of 10mg  lexapro for 1 week then stop  May use clonazepam as needed  See me in about a month if worsening of panic attacks and we can increase the dose  Return for fasting labs  Agree with weight goal closer to 200. Slow and steady wins the race to get there and stay there!

## 2014-11-26 NOTE — Progress Notes (Signed)
John Reddish, MD Phone: [phone]  Subjective:  Patient presents today to establish care with me as their new primary care provider. Patient was formerly a patient of Dr. Leanne Chang. Chief complaint-noted.   Walks 3-4 miles a day Trying to eat better Had gotten up 6 lbs higher than current weight but has increased efforts to lose. Weight goal 200 lbs.   Panic Attacks -history of attacks while driving on the highway. Sudden urges and feelings that he will not be able to control the car (has not actually had any accidents). Was aware of sexual side effects. Had been on wellbutrin for depression in past. Before very long drives into major cities uses clonazepam. Sexual side effects main reason for wanting to switch. PHQ2 of 0.   ROS- No SI/HI. Headaches that he has had since his history of carotid artery aneurysm. No gout flares since allopurinol. Otherwise full ROS completed and negative.   The following were reviewed and entered/updated in epic: Past Medical History  Diagnosis Date  . Cerebral aneurysm   . Headache(784.0)     from cerebral aneurysm  2008  . GERD (gastroesophageal reflux disease)     occasional  . Anxiety   . Pneumonia     2010  . Depression     no treatment  . PONV (postoperative nausea and vomiting)   . Allergy   . Nephrolithiasis 09/19/2010  . CHOLELITHIASIS 06/12/2007    Qualifier: Diagnosis of  By: Scherrie Gerlach     Patient Active Problem List   Diagnosis Date Noted  . Aneurysm, carotid artery, internal 02/18/2011    Priority: High  . Panic attacks 11/26/2014    Priority: Medium  . Depression 03/14/2011    Priority: Medium  . Gout 06/12/2007    Priority: Medium  . Allergic rhinitis 11/26/2014    Priority: Low  . Nephrolithiasis 09/19/2010    Priority: Low  . Pityriasis rosea 01/21/2008    Priority: Low  . Gilbert's disease 06/12/2007    Priority: Low   Past Surgical History  Procedure Laterality Date  . Knee arthroscopies      has had this  several times  . Stone extraction with basket  12/30/10    Family History  Problem Relation Age of Onset  . Prostate cancer Father 12  . Diabetes Brother     Medications- reviewed and updated Current Outpatient Prescriptions  Medication Sig Dispense Refill  . allopurinol (ZYLOPRIM) 100 MG tablet TAKE 1 TABLET (100 MG TOTAL) BY MOUTH DAILY. 30 tablet 3  . escitalopram (LEXAPRO) 10 MG tablet TAKE 1 TABLET EVERY DAY 30 tablet 3  . naproxen (NAPROSYN) 500 MG tablet Take 1 tablet (500 mg total) by mouth 2 (two) times daily with a meal. 60 tablet 2  . clonazePAM (KLONOPIN) 0.5 MG tablet Take 1 tablet (0.5 mg total) by mouth 2 (two) times daily as needed for anxiety. (Patient not taking: Reported on 11/26/2014) 30 tablet 1  . colchicine (COLCRYS) 0.6 MG tablet TAKE 1 TABLET BY MOUTH TWICE DAILY (Patient not taking: Reported on 11/26/2014) 60 tablet 1  . diphenhydrAMINE (BENADRYL) 25 MG tablet Take 25 mg by mouth at bedtime as needed. For sleep    . Polyethyl Glycol-Propyl Glycol (SYSTANE OP) Place 1 drop into both eyes at bedtime.      . traMADol (ULTRAM) 50 MG tablet Take 1 tablet (50 mg total) by mouth every 8 (eight) hours as needed. For pain (Patient not taking: Reported on 11/26/2014) 60 tablet 3  Allergies-reviewed and updated Allergies  Allergen Reactions  . Cheese Anaphylaxis    History   Social History  . Marital Status: Married    Spouse Name: N/A  . Number of Children: N/A  . Years of Education: N/A   Social History Main Topics  . Smoking status: Never Smoker   . Smokeless tobacco: Not on file  . Alcohol Use: 1.2 - 1.8 oz/week    2-3 Standard drinks or equivalent per week  . Drug Use: Yes    Special: Marijuana     Comment: 1990s, Has done it in legal states such as [place] since that time  . Sexual Activity: Not on file   Other Topics Concern  . None   Social History Narrative   Married (wife patient elsewhere). 3 children.       Works as Optician, dispensing:  play golf, reading, walking, swimming    ROS--See HPI   Objective: BP 110/80 mmHg  Pulse 101  Temp(Src) 98.3 F (36.8 C)  Wt 230 lb (104.327 kg) Gen: NAD, resting comfortably HEENT: Mucous membranes are moist. Oropharynx normal Neck: no thyromegaly, no lymphadenopathy CV: RRR no murmurs rubs or gallops Lungs: CTAB no crackles, wheeze, rhonchi Abdomen: soft/nontender/nondistended/normal bowel sounds. No rebound or guarding. Obese.  Rectal: normal size prostate without nodules Ext: no edema Skin: warm, dry, 7x3 mm Left low back brown, regular border oval shaped Neuro: grossly normal, moves all extremities, PERRLA   Assessment/Plan:  49 y.o. male presenting for annual physical.  Health Maintenance counseling: 1. Anticipatory guidance: Patient counseled regarding regular dental exams, wearing seatbelts, wearing sunscreen 2. Risk factor reduction:  Advised patient of need for regular exercise and diet rich and fruits and vegetables to reduce risk of heart attack and stroke.  3. Immunizations/screenings/ancillary studies- HIV screening as part of fasting labs  Panic attacks SSRI sexual side effects too great (lexapro). Change back to wellbutrin 150mg  XL though we discussed more of an "upper" and could increase panic attacks/anxiety. Continue clonazepam as use sparing. Follow up 1 month to titrate up potentially.    1 month anxiety follow up. Otherwise 1 year CPE  Future fasting labs  Orders Placed This Encounter  Procedures  . CBC    [place]    Standing Status: Future     Number of Occurrences:      Standing Expiration Date: 11/26/2015  . Comprehensive metabolic panel    [place]    Standing Status: Future     Number of Occurrences:      Standing Expiration Date: 11/26/2015    Order Specific Question:  Has the patient fasted?    Answer:  No  . Lipid panel    [place]    Standing Status: Future     Number of Occurrences:      Standing Expiration Date: 11/26/2015    Order  Specific Question:  Has the patient fasted?    Answer:  No  . TSH    [place]    Standing Status: Future     Number of Occurrences:      Standing Expiration Date: 11/26/2015  . PSA    Standing Status: Future     Number of Occurrences:      Standing Expiration Date: 11/26/2015  . HIV antibody    [facility]    Standing Status: Future     Number of Occurrences:      Standing Expiration Date: 11/26/2015  . Uric Acid    Standing Status: Future  Number of Occurrences:      Standing Expiration Date: 11/26/2015    Meds ordered this encounter  Medications  . buPROPion (WELLBUTRIN XL) 150 MG 24 hr tablet    Sig: Take 1 tablet (150 mg total) by mouth daily.    Dispense:  30 tablet    Refill:  5

## 2014-11-26 NOTE — Assessment & Plan Note (Signed)
SSRI sexual side effects too great (lexapro). Change back to wellbutrin 150mg  XL though we discussed more of an "upper" and could increase panic attacks/anxiety. Continue clonazepam as use sparing. Follow up 1 month to titrate up potentially.

## 2014-12-01 ENCOUNTER — Other Ambulatory Visit (INDEPENDENT_AMBULATORY_CARE_PROVIDER_SITE_OTHER): Payer: BC Managed Care – PPO

## 2014-12-01 DIAGNOSIS — Z7251 High risk heterosexual behavior: Secondary | ICD-10-CM

## 2014-12-01 DIAGNOSIS — M1009 Idiopathic gout, multiple sites: Secondary | ICD-10-CM

## 2014-12-01 DIAGNOSIS — Z0000 Encounter for general adult medical examination without abnormal findings: Secondary | ICD-10-CM

## 2014-12-01 LAB — COMPREHENSIVE METABOLIC PANEL
ALT: 29 U/L (ref 0–53)
AST: 23 U/L (ref 0–37)
Albumin: 4.6 g/dL (ref 3.5–5.2)
Alkaline Phosphatase: 58 U/L (ref 39–117)
BUN: 11 mg/dL (ref 6–23)
CO2: 31 mEq/L (ref 19–32)
Calcium: 9.7 mg/dL (ref 8.4–10.5)
Chloride: 103 mEq/L (ref 96–112)
Creatinine, Ser: 1.02 mg/dL (ref 0.40–1.50)
GFR: 82.8 mL/min (ref >60.00)
Glucose, Bld: 93 mg/dL (ref 70–99)
Potassium: 4.3 mEq/L (ref 3.5–5.1)
Sodium: 139 mEq/L (ref 135–145)
Total Bilirubin: 1.6 mg/dL — ABNORMAL HIGH (ref 0.2–1.2)
Total Protein: 7 g/dL (ref 6.0–8.3)

## 2014-12-01 LAB — LIPID PANEL
Cholesterol: 213 mg/dL — ABNORMAL HIGH (ref 0–200)
HDL: 57.9 mg/dL (ref >39.00)
LDL Cholesterol: 139 mg/dL — ABNORMAL HIGH (ref 0–99)
NonHDL: 155.1
Total CHOL/HDL Ratio: 4
Triglycerides: 80 mg/dL (ref 0.0–149.0)
VLDL: 16 mg/dL (ref 0.0–40.0)

## 2014-12-01 LAB — TSH: TSH: 1.05 u[IU]/mL (ref 0.35–4.50)

## 2014-12-01 LAB — CBC
HCT: 43.3 % (ref 39.0–52.0)
Hemoglobin: 14.9 g/dL (ref 13.0–17.0)
MCHC: 34.5 g/dL (ref 30.0–36.0)
MCV: 85 fl (ref 78.0–100.0)
Platelets: 241 10*3/uL (ref 150.0–400.0)
RBC: 5.09 Mil/uL (ref 4.22–5.81)
RDW: 16.1 % — ABNORMAL HIGH (ref 11.5–15.5)
WBC: 8 10*3/uL (ref 4.0–10.5)

## 2014-12-01 LAB — PSA: PSA: 1.25 ng/mL (ref 0.10–4.00)

## 2014-12-01 LAB — URIC ACID: Uric Acid, Serum: 7.9 mg/dL — ABNORMAL HIGH (ref 4.0–7.8)

## 2014-12-02 LAB — HIV ANTIBODY (ROUTINE TESTING W REFLEX): HIV 1&2 Ab, 4th Generation: NONREACTIVE

## 2014-12-06 ENCOUNTER — Other Ambulatory Visit: Payer: Self-pay | Admitting: Family

## 2014-12-06 ENCOUNTER — Other Ambulatory Visit: Payer: Self-pay | Admitting: Family Medicine

## 2015-01-08 ENCOUNTER — Other Ambulatory Visit: Payer: Self-pay | Admitting: Family Medicine

## 2015-06-17 ENCOUNTER — Other Ambulatory Visit: Payer: Self-pay | Admitting: Family Medicine

## 2015-07-03 ENCOUNTER — Ambulatory Visit (INDEPENDENT_AMBULATORY_CARE_PROVIDER_SITE_OTHER): Payer: BC Managed Care – PPO | Admitting: Internal Medicine

## 2015-07-03 ENCOUNTER — Encounter: Payer: Self-pay | Admitting: Internal Medicine

## 2015-07-03 ENCOUNTER — Other Ambulatory Visit: Payer: Self-pay | Admitting: Family Medicine

## 2015-07-03 ENCOUNTER — Encounter: Payer: Self-pay | Admitting: Family Medicine

## 2015-07-03 VITALS — BP 144/94 | Temp 98.7°F | Wt 233.4 lb

## 2015-07-03 DIAGNOSIS — M79671 Pain in right foot: Secondary | ICD-10-CM

## 2015-07-03 DIAGNOSIS — M7989 Other specified soft tissue disorders: Secondary | ICD-10-CM

## 2015-07-03 DIAGNOSIS — Z23 Encounter for immunization: Secondary | ICD-10-CM | POA: Diagnosis not present

## 2015-07-03 DIAGNOSIS — M79674 Pain in right toe(s): Secondary | ICD-10-CM

## 2015-07-03 MED ORDER — AMOXICILLIN-POT CLAVULANATE 875-125 MG PO TABS: 1.0000 | ORAL_TABLET | Freq: Two times a day (BID) | ORAL | Status: DC

## 2015-07-03 NOTE — Patient Instructions (Signed)
antibiotic  Get x ray .  Of toe  Fu dr hunter in 1-2 weeks   Or if needed.   Take pix .  Before and after .

## 2015-07-03 NOTE — Telephone Encounter (Signed)
ERROR

## 2015-07-03 NOTE — Telephone Encounter (Signed)
Yes thanks- may fill clonazepam  Unclear indication for tramadol and last fill 2013- would want to discuss at visit

## 2015-07-03 NOTE — Progress Notes (Signed)
Pre visit review using our clinic review tool, if applicable. No additional management support is needed unless otherwise documented below in the visit note.  Chief Complaint  Patient presents with  . Toe Problem    HPI: Patient John Costa  comes in today for SDA for  new problem evaluation.PCP NA  Onset  3 weeks  Ago  Onset swelling  Right distal toe redness   And then tender to touch .  [facility]  And given bright blue pill for  6 days  Tid .  Clindamycin   Then looked like totally better .then came back    10 days later  After dancing at wedding and noted increased   Soreness . And now looks like oridingal swelling and redness   Not acute no goutlike  Says no swelling when better  Has somedeviation of dip area but no recent trauma or nail tenderness  ROS: See pertinent positives and negatives per HPI. No fever  Will need refills of as needed tramadol and clon for  Has  Has bottles  Pills expired   Past Medical History  Diagnosis Date  . Cerebral aneurysm   . Headache(784.0)     from cerebral aneurysm  2008  . GERD (gastroesophageal reflux disease)     occasional  . Anxiety   . Pneumonia     2010  . Depression     no treatment  . PONV (postoperative nausea and vomiting)   . Allergy   . Nephrolithiasis 09/19/2010  . CHOLELITHIASIS 06/12/2007    Qualifier: Diagnosis of  By: Scherrie Gerlach      Family History  Problem Relation Age of Onset  . Prostate cancer Father 15  . Diabetes Brother     Social History   Social History  . Marital Status: Married    Spouse Name: N/A  . Number of Children: N/A  . Years of Education: N/A   Social History Main Topics  . Smoking status: Never Smoker   . Smokeless tobacco: None  . Alcohol Use: 1.2 - 1.8 oz/week    2-3 Standard drinks or equivalent per week  . Drug Use: Yes    Special: Marijuana     Comment: 1990s, Has done it in legal states such as [place] since that time  . Sexual Activity: Not  Asked   Other Topics Concern  . None   Social History Narrative   Married (wife patient elsewhere). 3 children.       Works as Probation officer      Hobbies: play golf, reading, walking, swimming    Outpatient Prescriptions Prior to Visit  Medication Sig Dispense Refill  . allopurinol (ZYLOPRIM) 100 MG tablet TAKE 1 TABLET (100 MG TOTAL) BY MOUTH DAILY. 30 tablet 3  . allopurinol (ZYLOPRIM) 100 MG tablet TAKE 1 TABLET (100 MG TOTAL) BY MOUTH DAILY. 30 tablet 6  . buPROPion (WELLBUTRIN XL) 150 MG 24 hr tablet TAKE 1 TABLET (150 MG TOTAL) BY MOUTH DAILY. 30 tablet 5  . clonazePAM (KLONOPIN) 0.5 MG tablet Take 1 tablet (0.5 mg total) by mouth 2 (two) times daily as needed for anxiety. 30 tablet 1  . colchicine (COLCRYS) 0.6 MG tablet TAKE 1 TABLET BY MOUTH TWICE DAILY 60 tablet 1  . diphenhydrAMINE (BENADRYL) 25 MG tablet Take 25 mg by mouth at bedtime as needed. For sleep    . traMADol (ULTRAM) 50 MG tablet Take 1 tablet (50 mg total) by mouth every 8 (  eight) hours as needed. For pain 60 tablet 3   No facility-administered medications prior to visit.     EXAM:  BP 144/94 mmHg  Temp(Src) 98.7 F (37.1 C) (Oral)  Wt 233 lb 6.4 oz (105.87 kg)  Body mass index is 32.57 kg/(m^2).  GENERAL: vitals reviewed and listed above, alert, oriented, appears well hydrated and in no acute distress cap refill  MS: moves all extremities  Right second toe with nodule like swelling pink  Firm discrete mild tenderness   proximal distal to nail   NV appear intact   PSYCH: pleasant and cooperative, no obvious depression or anxiety  ASSESSMENT AND PLAN:  Discussed the following assessment and plan:  Pain and swelling of toe of right foot - Plan: DG Toe 2nd Right  Need for prophylactic vaccination and inoculation against influenza - Plan: Flu Vaccine QUAD 36+ mos PF IM (Fluarix & Fluzone Quad PF) Curious presentation like a cyst infected?  Not an abscess or cellulitis per se . Hx of response to antibiotic  and recurrance .  Get x ray toe and fu Dr Yong Channel   Take pix pre and post rx.  -Patient advised to return or notify health care team  if symptoms worsen ,persist or new concerns arise.  Patient Instructions  antibiotic  Get x ray .  Of toe  Fu dr hunter in 1-2 weeks   Or if needed.   Take pix .  Before and after .    Standley Brooking. Lamonica Trueba M.D.

## 2015-07-06 MED ORDER — CLONAZEPAM 0.5 MG PO TABS: 0.5000 mg | ORAL_TABLET | Freq: Two times a day (BID) | ORAL | Status: DC | PRN

## 2015-07-13 ENCOUNTER — Encounter: Payer: Self-pay | Admitting: Podiatry

## 2015-07-13 ENCOUNTER — Ambulatory Visit (INDEPENDENT_AMBULATORY_CARE_PROVIDER_SITE_OTHER): Payer: BC Managed Care – PPO | Admitting: Family Medicine

## 2015-07-13 ENCOUNTER — Encounter: Payer: Self-pay | Admitting: Family Medicine

## 2015-07-13 ENCOUNTER — Ambulatory Visit (INDEPENDENT_AMBULATORY_CARE_PROVIDER_SITE_OTHER): Payer: BC Managed Care – PPO | Admitting: Podiatry

## 2015-07-13 ENCOUNTER — Ambulatory Visit (INDEPENDENT_AMBULATORY_CARE_PROVIDER_SITE_OTHER): Payer: BC Managed Care – PPO

## 2015-07-13 VITALS — BP 132/78 | HR 82 | Temp 98.1°F | Wt 231.0 lb

## 2015-07-13 VITALS — BP 115/80 | HR 104 | Resp 18

## 2015-07-13 DIAGNOSIS — M10071 Idiopathic gout, right ankle and foot: Secondary | ICD-10-CM | POA: Diagnosis not present

## 2015-07-13 DIAGNOSIS — M79674 Pain in right toe(s): Secondary | ICD-10-CM | POA: Diagnosis not present

## 2015-07-13 DIAGNOSIS — R52 Pain, unspecified: Secondary | ICD-10-CM

## 2015-07-13 DIAGNOSIS — M109 Gout, unspecified: Secondary | ICD-10-CM

## 2015-07-13 MED ORDER — TRAMADOL HCL 50 MG PO TABS: 50.0000 mg | ORAL_TABLET | Freq: Three times a day (TID) | ORAL | Status: DC | PRN

## 2015-07-13 MED ORDER — METHYLPREDNISOLONE 4 MG PO TBPK
ORAL_TABLET | ORAL | Status: DC
Start: 2015-07-13 — End: 2015-08-19

## 2015-07-13 NOTE — Progress Notes (Signed)
Pre visit review using our clinic review tool, if applicable. No additional management support is needed unless otherwise documented below in the visit note. 

## 2015-07-13 NOTE — Progress Notes (Signed)
Subjective:    Patient ID: John Costa, male    DOB: Feb 11, 1966, 49 y.o.   MRN: [id number]  HPI  49 year old male presents the office with concerns of a possible cyst/infection to the right second toe which has been ongoing for the last 3 weeks. He states at the end of the second toe and the right foot has been red and swollen. He states the areas been uncomfortable he denies any pain. He has been on 2 rounds of antibiotics which is not helped. He does have a history of gout although he states that he does not feel like gout he normally has. He has not been on any recent treatment for gout. He states that the redness remains localized over the end of the toe however he is denies any red streaks. He denies any drainage or pus. No recent injury or trauma. No other complaints at this time.   Review of Systems  All other systems reviewed and are negative.      Objective:   Physical Exam AAO x3, NAD The right second digit along the DIPJ has localized edema, erythema and a slight increase in warmth. There is no ascending synovitis. There is no drainage or purulence. There appears to be a small amount of fluid underneath this area however there is noted to be an overwhelming amount of fluctuance and there is no crepitation. Erythema is localized and does not extend. There is hammertoe contracture present. There is mild tenderness to palpation overlying the right second DIPJ overlying the area of concern. There is no other areas of tenderness to bilateral lower extremities. MMT 5/5, ROM WNL. No other open lesions or pre-ulcer lesions identified bilaterally. There is no pain with calf compression, swelling, warmth, erythema.     Assessment & Plan:  49 year old male right second toe likely gout -Treatment options discussed including all alternatives, risks, and complications -X-rays were obtained and reviewed with the patient.  -Etiology of symptoms were discussed -Given that he has been on  antibiotics for 2 rounds of much relief in his history of gout do believe this is most likely a result of gout. I discussed aspiration of the area to see if there is any purulence or any tophi present. I discussed risks, complications for which she understands and wishes to proceed in verbally consents. Under sterile conditions a total of 2 mL of a mixture of 0.5% Marcaine plain and 2% lidocaine was infiltrated in a digital block fashion around right second toe. Once anesthetized the skin was prepped in sterile fashion. An 18-gauge needle was utilized to puncture the area and a small amount of tophi was expressed. Because of this I discussed an I&D to help remove any tophi. He wishes to proceed with this. #15 with scalpel visualized make a small 0.4 cm incision over the area. There is expressing more tophi was identified. There is no purulence. There was cleaned and a Steri-Strip was placed over the area. Into medical limits place followed by dry sterile dressing. He can with the bandage tomorrow inserted 7 Epson salt soaks twice a day, atraumatic ointment and a Band-Aid. Given the results of the procedure will start a Medrol Dosepak for gout. He can finish his course of antibiotics. Ordered uric acid and CBC Follow-up in 10 days or sooner if any problems are to arise. In the meantime call the office with any questions, concerns, change in symptoms. Monitor for any clinical signs or symptoms of infection and directed to call the  office immediately should any occur or go to the ER.  Celesta Gentile, DPM

## 2015-07-13 NOTE — Patient Instructions (Signed)
Abscess vs. Cyst? Really unclear  Urgent referral for podiatry- Dorna Bloom will give you your appointment before you leave- trying for today through Wednesday  Tramadol if pain not controlled with tylenol or ibuprofen or warm soaks, could try ice as well

## 2015-07-13 NOTE — Progress Notes (Signed)
Garret Reddish, MD  Subjective:  John Costa is a 49 y.o. year old very pleasant male patient who presents for/with See problem oriented charting ROS- no fever, chills, nausea, vomiting. Known headaches unchanged pattern.   Past Medical History-  Patient Active Problem List   Diagnosis Date Noted  . Aneurysm, carotid artery, internal 02/18/2011    Priority: High  . Panic attacks 11/26/2014    Priority: Medium  . Depression 03/14/2011    Priority: Medium  . Gout 06/12/2007    Priority: Medium  . Allergic rhinitis 11/26/2014    Priority: Low  . Skin lesion 11/26/2014    Priority: Low  . Nephrolithiasis 09/19/2010    Priority: Low  . Pityriasis rosea 01/21/2008    Priority: Low  . Gilbert's disease 06/12/2007    Priority: Low    Medications- reviewed and updated Current Outpatient Prescriptions  Medication Sig Dispense Refill  . allopurinol (ZYLOPRIM) 100 MG tablet TAKE 1 TABLET (100 MG TOTAL) BY MOUTH DAILY. 30 tablet 3  . allopurinol (ZYLOPRIM) 100 MG tablet TAKE 1 TABLET (100 MG TOTAL) BY MOUTH DAILY. 30 tablet 6  . amoxicillin-clavulanate (AUGMENTIN) 875-125 MG tablet Take 1 tablet by mouth every 12 (twelve) hours. 14 tablet 0  . buPROPion (WELLBUTRIN XL) 150 MG 24 hr tablet TAKE 1 TABLET (150 MG TOTAL) BY MOUTH DAILY. 30 tablet 5  . clonazePAM (KLONOPIN) 0.5 MG tablet Take 1 tablet (0.5 mg total) by mouth 2 (two) times daily as needed for anxiety. 60 tablet 5  . colchicine (COLCRYS) 0.6 MG tablet TAKE 1 TABLET BY MOUTH TWICE DAILY 60 tablet 1  . diphenhydrAMINE (BENADRYL) 25 MG tablet Take 25 mg by mouth at bedtime as needed. For sleep    . traMADol (ULTRAM) 50 MG tablet Take 1 tablet (50 mg total) by mouth every 8 (eight) hours as needed. For pain 60 tablet 3   Objective: BP 132/78 mmHg  Pulse 82  Temp(Src) 98.1 F (36.7 C) (Oral)  Wt 231 lb (104.781 kg)  SpO2 98% Gen: NAD, resting comfortably CV: RRR no murmurs rubs or gallops Lungs: CTAB no crackles,  wheeze, rhonchi Ext: no edema Right 2nd toe with 1.5 x 1.5 cm erythematous raised lesion near DIP joing. Not warm to touch. No expanding erythema Skin: warm, dry, no rash Neuro: grossly normal, moves all extremities, intact distal sensation    Assessment/Plan:  Pain of toe of right foot - Plan: Ambulatory referral to Podiatry S: noted about a month ago. Went to urgent care and treated with clindamycin for 6 days. Improved but later recurred after some dancing at a wedding. Does not feel or look like his typical gout that he is on allopurinol for. Saw Dr. Regis Bill 10 days ago and placed on augmentin. Did not improve at all on that course. He has still been active and even went hiking but in last 24 hours has had intensified pain to the point that sometimes he thinks the area has burst until he looks at his toe. Tylenol for the pain. Needs refill on tramadol for headaches and we discussed this could be used for toe pain if needed A/P: unclear etiology- abscess? Ganglion cyst? Patient will likely need x-rays(did not follow through with this as planned with Dr. Regis Bill) as well as ultrasound (suspect will be of greater benefit). Discussed ortho vs. Sports med vs. Podiatry and patient opts for podiatry- urgent referral placed and will be seen this afternoon.   Orders Placed This Encounter  Procedures  .  Ambulatory referral to Podiatry    Referral Priority:  Urgent    Referral Type:  Consultation    Referral Reason:  Specialty Services Required    Requested Specialty:  Podiatry    Number of Visits Requested:  1    Meds ordered this encounter  . traMADol (ULTRAM) 50 MG tablet    Sig: Take 1 tablet (50 mg total) by mouth every 8 (eight) hours as needed. For pain    Dispense:  60 tablet    Refill:  3

## 2015-07-14 ENCOUNTER — Encounter: Payer: Self-pay | Admitting: Podiatry

## 2015-07-16 ENCOUNTER — Ambulatory Visit: Payer: BC Managed Care – PPO | Admitting: Family Medicine

## 2015-07-27 ENCOUNTER — Ambulatory Visit (INDEPENDENT_AMBULATORY_CARE_PROVIDER_SITE_OTHER): Payer: BC Managed Care – PPO | Admitting: Podiatry

## 2015-07-27 ENCOUNTER — Encounter: Payer: Self-pay | Admitting: Podiatry

## 2015-07-27 VITALS — BP 116/74 | HR 99 | Resp 18

## 2015-07-27 DIAGNOSIS — M109 Gout, unspecified: Secondary | ICD-10-CM

## 2015-07-27 DIAGNOSIS — M10071 Idiopathic gout, right ankle and foot: Secondary | ICD-10-CM | POA: Diagnosis not present

## 2015-07-27 MED ORDER — COLCHICINE 0.6 MG PO TABS: ORAL_TABLET | ORAL | Status: DC

## 2015-07-27 NOTE — Patient Instructions (Signed)
Please give me a call if symptoms are not improved within the next 2-3 weeks, or call sooner should any problems arise.

## 2015-07-27 NOTE — Progress Notes (Signed)
Patient ID: John Costa, male   DOB: 02-Jul-1966, 49 y.o.   MRN: [id number]  Subjective: 49 year old male presents the office they for follow-up evaluation of gout to the right second toe. He did his blood work performed today. Not yet received the results. He has can leave the course of steroids. He states that since last appointment the redness the swelling has greatly improved to the right second toe although the area does persists. He denies any pain to the area at this time. He denies any red streaks. He denies any drainage or open sores. No other complaints at this time. He denies any systemic complaints such as fevers, chills, nausea, vomiting. No calf pain, chest pain, shortness of breath.  Objective: AAO x3, NAD  DP/PT pulses palpable 2/4, CRT less than 3 seconds neurological status intact.  The distal to the right second toe and dorsal portion of the toe but does appear to be a small localized area of edema and erythema although appears to be significant improved. There is no increase in warmth. No ascending cellulitis. No drainage or purulence. No open lesion. There is no fluctuance or crepitus. There is no tenderness to palpation. This is over the area of the DIPJ there is slight deviation of the toe at the level of the DIPJ. There is no other areas of tenderness bilateral lower extremity. No other open lesions or pre-ulcerative lesions. No pain with calf compression, swelling, warmth, erythema.   Assessment:  49 year old male with resolving gout right second toe   Plan: -Treatment options discussed including all alternatives, risks, and complications - He was inquiring about possible round of colchicine. I did prescribe this today as the symptoms do appear to be continuous. - I discussed him that there is a high likelihood he'll have a nodule present after this resolves due to the arthritis the joint. - follow-up with in 2 weeks the symptoms are not resolved or sooner any problems  are to arise. Call any questions or concerns in the meantime.  Celesta Gentile, DPM

## 2015-07-28 LAB — CBC WITH DIFFERENTIAL/PLATELET
Basophils Absolute: 0 10*3/uL (ref 0.0–0.1)
Basophils Relative: 0 % (ref 0–1)
Eosinophils Absolute: 0.2 10*3/uL (ref 0.0–0.7)
Eosinophils Relative: 2 % (ref 0–5)
HCT: 43.6 % (ref 39.0–52.0)
Hemoglobin: 15.3 g/dL (ref 13.0–17.0)
Lymphocytes Relative: 20 % (ref 12–46)
Lymphs Abs: 1.9 10*3/uL (ref 0.7–4.0)
MCH: 29.8 pg (ref 26.0–34.0)
MCHC: 35.1 g/dL (ref 30.0–36.0)
MCV: 85 fL (ref 78.0–100.0)
MPV: 10.2 fL (ref 8.6–12.4)
Monocytes Absolute: 0.5 10*3/uL (ref 0.1–1.0)
Monocytes Relative: 5 % (ref 3–12)
Neutro Abs: 6.9 10*3/uL (ref 1.7–7.7)
Neutrophils Relative %: 73 % (ref 43–77)
Platelets: 239 10*3/uL (ref 150–400)
RBC: 5.13 MIL/uL (ref 4.22–5.81)
RDW: 15.4 % (ref 11.5–15.5)
WBC: 9.4 10*3/uL (ref 4.0–10.5)

## 2015-07-28 LAB — URIC ACID: Uric Acid, Serum: 8.5 mg/dL — ABNORMAL HIGH (ref 4.0–7.8)

## 2015-07-29 ENCOUNTER — Telehealth: Payer: Self-pay | Admitting: *Deleted

## 2015-07-29 NOTE — Telephone Encounter (Signed)
Left message informing pt Dr. Jacqualyn Posey had reviewed labs 07/27/2015, and had faxed them to Dr. Garret Reddish at pt's request, to call for results.

## 2015-07-29 NOTE — Telephone Encounter (Signed)
Pt request labs of 07/27/2015 to be faxed to his PCP.  Done.

## 2015-08-06 ENCOUNTER — Other Ambulatory Visit: Payer: Self-pay | Admitting: Urology

## 2015-08-21 ENCOUNTER — Encounter (HOSPITAL_COMMUNITY)
Admission: RE | Admit: 2015-08-21 | Discharge: 2015-08-21 | Disposition: A | Discharge status: Home / Self Care | Payer: BC Managed Care – PPO | Source: Ambulatory Visit | Attending: Urology | Admitting: Urology

## 2015-08-21 ENCOUNTER — Encounter (HOSPITAL_COMMUNITY): Payer: Self-pay

## 2015-08-21 DIAGNOSIS — Q632 Ectopic kidney: Secondary | ICD-10-CM | POA: Diagnosis not present

## 2015-08-21 DIAGNOSIS — Z8679 Personal history of other diseases of the circulatory system: Secondary | ICD-10-CM | POA: Diagnosis not present

## 2015-08-21 DIAGNOSIS — M109 Gout, unspecified: Secondary | ICD-10-CM | POA: Diagnosis not present

## 2015-08-21 DIAGNOSIS — Z79891 Long term (current) use of opiate analgesic: Secondary | ICD-10-CM | POA: Diagnosis not present

## 2015-08-21 DIAGNOSIS — N200 Calculus of kidney: Secondary | ICD-10-CM | POA: Diagnosis not present

## 2015-08-21 DIAGNOSIS — F329 Major depressive disorder, single episode, unspecified: Secondary | ICD-10-CM | POA: Diagnosis not present

## 2015-08-21 DIAGNOSIS — K219 Gastro-esophageal reflux disease without esophagitis: Secondary | ICD-10-CM | POA: Diagnosis not present

## 2015-08-21 DIAGNOSIS — Z79899 Other long term (current) drug therapy: Secondary | ICD-10-CM | POA: Diagnosis not present

## 2015-08-21 DIAGNOSIS — Z87442 Personal history of urinary calculi: Secondary | ICD-10-CM | POA: Diagnosis not present

## 2015-08-21 LAB — BASIC METABOLIC PANEL
Anion gap: 9 (ref 5–15)
BUN: 14 mg/dL (ref 6–20)
CO2: 27 mmol/L (ref 22–32)
Calcium: 9.9 mg/dL (ref 8.9–10.3)
Chloride: 106 mmol/L (ref 101–111)
Creatinine, Ser: 1.19 mg/dL (ref 0.61–1.24)
GFR calc Af Amer: >60 mL/min (ref >60)
GFR calc non Af Amer: >60 mL/min (ref >60)
Glucose, Bld: 88 mg/dL (ref 65–99)
Potassium: 4 mmol/L (ref 3.5–5.1)
Sodium: 142 mmol/L (ref 135–145)

## 2015-08-21 NOTE — Patient Instructions (Addendum)
KENTYN MAGNANI  08/21/2015   Your procedure is scheduled on: Monday 08-24-15  Report to [facility] Main  Entrance take [facility]  elevators to 3rd floor to  [place] at AM.  Call this number if you have problems the morning of surgery [phone]   Remember: ONLY 1 PERSON MAY GO WITH YOU TO SHORT STAY TO GET  READY MORNING OF [place].  Do not eat food or drink liquids :After Midnight.             You may have clear liquid until 10:30AM the morning of surgery.     Take these medicines the morning of surgery with A SIP OF WATER: Klonopin, Zantac                                You may not have any metal on your body including hair pins and              piercings  Do not wear jewelry, make-up, lotions, powders or perfumes, deodorant             Do not wear nail polish.  Do not shave  48 hours prior to surgery.              Men may shave face and neck.   Do not bring valuables to the hospital. [place].  Contacts, dentures or bridgework may not be worn into surgery.  Leave suitcase in the car. After surgery it may be brought to your room.     Patients discharged the day of surgery will not be allowed to drive home.  Name and phone number of your driver: Jackelyn Poling (wife) [phone]  Special Instructions: practice deep breathing and leg exercises              Please read over the following fact sheets you were given: _____________________________________________________________________             [facility] - Preparing for Surgery Before surgery, you can play an important role.  Because skin is not sterile, your skin needs to be as free of germs as possible.  You can reduce the number of germs on your skin by washing with CHG (chlorahexidine gluconate) soap before surgery.  CHG is an antiseptic cleaner which kills germs and bonds with the skin to continue killing germs even after washing. Please  DO NOT use if you have an allergy to CHG or antibacterial soaps.  If your skin becomes reddened/irritated stop using the CHG and inform your nurse when you arrive at Short Stay. Do not shave (including legs and underarms) for at least 48 hours prior to the first CHG shower.  You may shave your face/neck. Please follow these instructions carefully:  1.  Shower with CHG Soap the night before surgery and the  morning of Surgery.  2.  If you choose to wash your hair, wash your hair first as usual with your  normal  shampoo.  3.  After you shampoo, rinse your hair and body thoroughly to remove the  shampoo.                           4.  Use CHG as you would any other liquid soap.  You can apply chg directly  to the skin and wash                       Gently with a scrungie or clean washcloth.  5.  Apply the CHG Soap to your body ONLY FROM THE NECK DOWN.   Do not use on face/ open                           Wound or open sores. Avoid contact with eyes, ears mouth and genitals (private parts).                       Wash face,  Genitals (private parts) with your normal soap.             6.  Wash thoroughly, paying special attention to the area where your surgery  will be performed.  7.  Thoroughly rinse your body with warm water from the neck down.  8.  DO NOT shower/wash with your normal soap after using and rinsing off  the CHG Soap.                9.  Pat yourself dry with a clean towel.            10.  Wear clean pajamas.            11.  Place clean sheets on your bed the night of your first shower and do not  sleep with pets. Day of Surgery : Do not apply any lotions/deodorants the morning of surgery.  Please wear clean clothes to the hospital/surgery center.  FAILURE TO FOLLOW THESE INSTRUCTIONS MAY RESULT IN THE CANCELLATION OF YOUR SURGERY PATIENT SIGNATURE_________________________________  NURSE  SIGNATURE__________________________________  ________________________________________________________________________    CLEAR LIQUID DIET   Foods Allowed                                                                     Foods Excluded  Coffee and tea, regular and decaf                             liquids that you cannot  Plain Jell-O in any flavor                                             see through such as: Fruit ices (not with fruit pulp)                                     milk, soups, orange juice  Iced Popsicles                                    All solid food Carbonated beverages, regular and diet  Cranberry, grape and apple juices Sports drinks like Gatorade Lightly seasoned clear broth or consume(fat free) Sugar, honey syrup  Sample Menu Breakfast                                Lunch                                     Supper Cranberry juice                    Beef broth                            Chicken broth Jell-O                                     Grape juice                           Apple juice Coffee or tea                        Jell-O                                      Popsicle                                                Coffee or tea                        Coffee or tea  _____________________________________________________________________

## 2015-08-21 NOTE — H&P (Signed)
  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 He has a history of calcium oxalate urolithiasis and hyperoxaluria.  Current treatment: Calcium citrate    Prior treatment:  Apr 2012: Left ureteroscopic laser lithotripsy (calcium oxalate monohydrate)  Nov 2012: L ESWL of proximal ureteral stone   He has known left renal stones with growth over the past few years.   Past Medical History  Problems  1. 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 History of Aneurysm Of The Right Internal Carotid Artery 3. History of Gilbert's Syndrome 4. History of Gout (M10.9) 5. History of depression (Z86.59) 6. History of esophageal reflux (Z87.19)  Nephrolithiasis (592.0) (N20.0)   Surgical History Problems  1. History of Anterior Segment Photography With Fluorescein Angiography 2. History of Cystoscopy With Insertion Of Ureteral Stent Left 3. History of Cystoscopy With Insertion Of Ureteral Stent Left 4. History of Cystoscopy With Pyeloscopy With Lithotripsy 5. History of Knee Surgery 6. History of Lithotripsy  Current Meds 1. ALPRAZolam 0.25 MG Oral Tablet; as needed;  Therapy: [id number] to Recorded 2. Benadryl Allergy 25 MG Oral Tablet;  Therapy: (Recorded:19Oct2012) to Recorded 3. BuPROPion HCl ER (SR) 150 MG Oral Tablet Extended Release 12 Hour;  Therapy: (Recorded:05Oct2016) to Recorded 4. HYDROmorphone HCl TABS;  Therapy: (Recorded:05Oct2016) to Recorded 5. Melatonin TABS;  Therapy: (Recorded:19Apr2012) to Recorded 6. TraMADol HCl - 50 MG Oral Tablet;  Therapy: (Recorded:27Sep2013) to Recorded  Allergies Medication  1. No Known Drug Allergies  Family History Problems  1. Family history of Father Deceased At Age ____   age 50 cause prostate cancer 2. Family history of Nephrolithiasis : Sister 3. Family history of Nephrolithiasis : Brother 4.  Family history of Prostate Cancer : Father  Social History Problems  1. Alcohol Use   2-3 per week 2. Marital History - Currently Married 3. Never A Smoker 4. Occupation:   Designer, television/film set  Height: 5 ft 10 in Weight: 225 lb  BMI Calculated: 32.28 BSA Calculated: 2.19   Physical Exam Constitutional: Well nourished and well developed . No acute distress.  Pulmonary: No respiratory distress and normal respiratory rhythm and effort.  Cardiovascular: Heart rate and rhythm are normal . No peripheral edema.    Discussion/Summary 1. Urolithiasis/left pelvic kidney: He will undergo left ureteroscopic laser lithotripsy. We reviewed the potential risks, complications, and expected recovery process associated with this procedure should he proceed.

## 2015-08-21 NOTE — Pre-Procedure Instructions (Signed)
CBC c Diff 08-06-15 epic

## 2015-08-21 NOTE — Pre-Procedure Instructions (Signed)
Pt has carotid aneurysm 2002. Pt states they did an angioscope and put him on blood thinners for 5 years, and is currently not on thinners anymore.  Spoke to Dr. Orene Desanctis on phone, no further orders for pt today at pre-op appt.

## 2015-08-24 ENCOUNTER — Encounter (HOSPITAL_COMMUNITY): Payer: Self-pay | Admitting: *Deleted

## 2015-08-24 ENCOUNTER — Ambulatory Visit (HOSPITAL_COMMUNITY)
Admission: RE | Admit: 2015-08-24 | Discharge: 2015-08-24 | Disposition: A | Discharge status: Home / Self Care | Payer: BC Managed Care – PPO | Source: Ambulatory Visit | Attending: Urology | Admitting: Urology

## 2015-08-24 ENCOUNTER — Ambulatory Visit (HOSPITAL_COMMUNITY): Payer: BC Managed Care – PPO | Admitting: Anesthesiology

## 2015-08-24 ENCOUNTER — Encounter (HOSPITAL_COMMUNITY)
Admission: RE | Disposition: A | Discharge status: Home / Self Care | Payer: Self-pay | Source: Ambulatory Visit | Attending: Urology

## 2015-08-24 DIAGNOSIS — Q632 Ectopic kidney: Secondary | ICD-10-CM | POA: Insufficient documentation

## 2015-08-24 DIAGNOSIS — K219 Gastro-esophageal reflux disease without esophagitis: Secondary | ICD-10-CM | POA: Insufficient documentation

## 2015-08-24 DIAGNOSIS — N200 Calculus of kidney: Secondary | ICD-10-CM | POA: Diagnosis not present

## 2015-08-24 DIAGNOSIS — Z87442 Personal history of urinary calculi: Secondary | ICD-10-CM | POA: Insufficient documentation

## 2015-08-24 DIAGNOSIS — Z79891 Long term (current) use of opiate analgesic: Secondary | ICD-10-CM | POA: Insufficient documentation

## 2015-08-24 DIAGNOSIS — F329 Major depressive disorder, single episode, unspecified: Secondary | ICD-10-CM | POA: Insufficient documentation

## 2015-08-24 DIAGNOSIS — M109 Gout, unspecified: Secondary | ICD-10-CM | POA: Insufficient documentation

## 2015-08-24 DIAGNOSIS — Z79899 Other long term (current) drug therapy: Secondary | ICD-10-CM | POA: Insufficient documentation

## 2015-08-24 DIAGNOSIS — Z8679 Personal history of other diseases of the circulatory system: Secondary | ICD-10-CM | POA: Insufficient documentation

## 2015-08-24 HISTORY — PX: HOLMIUM LASER APPLICATION: SHX5852

## 2015-08-24 HISTORY — PX: CYSTOSCOPY WITH RETROGRADE PYELOGRAM, URETEROSCOPY AND STENT PLACEMENT: SHX5789

## 2015-08-24 SURGERY — CYSTOURETEROSCOPY, WITH RETROGRADE PYELOGRAM AND STENT INSERTION
Anesthesia: General | Site: Ureter | Laterality: Left

## 2015-08-24 MED ORDER — PHENYLEPHRINE HCL 10 MG/ML IJ SOLN
INTRAMUSCULAR | Status: DC | PRN
  Administered 2015-08-24: 80 ug via INTRAVENOUS

## 2015-08-24 MED ORDER — IOHEXOL 300 MG/ML  SOLN
INTRAMUSCULAR | Status: DC | PRN
  Administered 2015-08-24: 25 mL via URETHRAL

## 2015-08-24 MED ORDER — CIPROFLOXACIN HCL 500 MG PO TABS: 500.0000 mg | ORAL_TABLET | Freq: Two times a day (BID) | ORAL | Status: DC

## 2015-08-24 MED ORDER — SODIUM CHLORIDE 0.9 % IR SOLN
Status: DC | PRN
Start: 2015-08-24 — End: 2015-08-24
  Administered 2015-08-24: 1000 mL
  Administered 2015-08-24: 3000 mL

## 2015-08-24 MED ORDER — MIDAZOLAM HCL 5 MG/5ML IJ SOLN
INTRAMUSCULAR | Status: DC | PRN
  Administered 2015-08-24: 2 mg via INTRAVENOUS

## 2015-08-24 MED ORDER — FENTANYL CITRATE (PF) 100 MCG/2ML IJ SOLN
INTRAMUSCULAR | Status: DC | PRN
  Administered 2015-08-24: 50 ug via INTRAVENOUS
  Administered 2015-08-24: 100 ug via INTRAVENOUS
  Administered 2015-08-24: 50 ug via INTRAVENOUS

## 2015-08-24 MED ORDER — PROPOFOL 10 MG/ML IV BOLUS
INTRAVENOUS | Status: DC | PRN
  Administered 2015-08-24: 200 mg via INTRAVENOUS

## 2015-08-24 MED ORDER — LACTATED RINGERS IV SOLN
INTRAVENOUS | Status: DC
  Administered 2015-08-24: 1000 mL via INTRAVENOUS
  Administered 2015-08-24: 17:00:00 via INTRAVENOUS

## 2015-08-24 MED ORDER — EPHEDRINE SULFATE 50 MG/ML IJ SOLN
INTRAMUSCULAR | Status: DC | PRN
  Administered 2015-08-24: 10 mg via INTRAVENOUS

## 2015-08-24 MED ORDER — PHENAZOPYRIDINE HCL 100 MG PO TABS: 100.0000 mg | ORAL_TABLET | Freq: Three times a day (TID) | ORAL | Status: DC | PRN

## 2015-08-24 MED ORDER — MIDAZOLAM HCL 2 MG/2ML IJ SOLN
INTRAMUSCULAR | Status: AC
  Filled 2015-08-24: qty 2

## 2015-08-24 MED ORDER — HYDROMORPHONE HCL 2 MG PO TABS: 2.0000 mg | ORAL_TABLET | ORAL | Status: DC | PRN

## 2015-08-24 MED ORDER — 0.9 % SODIUM CHLORIDE (POUR BTL) OPTIME
TOPICAL | Status: DC | PRN
  Administered 2015-08-24: 1000 mL

## 2015-08-24 MED ORDER — CIPROFLOXACIN IN D5W 400 MG/200ML IV SOLN
INTRAVENOUS | Status: AC
  Filled 2015-08-24: qty 200

## 2015-08-24 MED ORDER — LIDOCAINE HCL (CARDIAC) 20 MG/ML IV SOLN
INTRAVENOUS | Status: DC | PRN
  Administered 2015-08-24: 100 mg via INTRAVENOUS

## 2015-08-24 MED ORDER — PROPOFOL 10 MG/ML IV BOLUS
INTRAVENOUS | Status: AC
  Filled 2015-08-24: qty 20

## 2015-08-24 MED ORDER — CIPROFLOXACIN IN D5W 400 MG/200ML IV SOLN
400.0000 mg | INTRAVENOUS | Status: AC
  Administered 2015-08-24: 400 mg via INTRAVENOUS

## 2015-08-24 MED ORDER — FENTANYL CITRATE (PF) 100 MCG/2ML IJ SOLN
INTRAMUSCULAR | Status: AC
  Filled 2015-08-24: qty 2

## 2015-08-24 MED ORDER — SODIUM CHLORIDE 0.9 % IR SOLN
Status: DC | PRN
  Administered 2015-08-24: 1000 mL
  Administered 2015-08-24: 3000 mL

## 2015-08-24 MED ORDER — DEXAMETHASONE SODIUM PHOSPHATE 10 MG/ML IJ SOLN
INTRAMUSCULAR | Status: DC | PRN
  Administered 2015-08-24: 10 mg via INTRAVENOUS

## 2015-08-24 MED ORDER — ONDANSETRON HCL 4 MG/2ML IJ SOLN
INTRAMUSCULAR | Status: DC | PRN
  Administered 2015-08-24: 4 mg via INTRAVENOUS

## 2015-08-24 SURGICAL SUPPLY — 21 items
BAG URO CATCHER STRL LF (DRAPE) ×2 IMPLANT
BASKET LASER NITINOL 1.9FR (BASKET) ×1 IMPLANT
BASKET ZERO TIP NITINOL 2.4FR (BASKET) ×1 IMPLANT
BSKT STON RTRVL 120 1.9FR (BASKET) ×1
BSKT STON RTRVL ZERO TP 2.4FR (BASKET) ×1
CATH INTERMIT  6FR 70CM (CATHETERS) ×2 IMPLANT
CLOTH BEACON ORANGE TIMEOUT ST (SAFETY) ×2 IMPLANT
FIBER LASER FLEXIVA 1000 (UROLOGICAL SUPPLIES) IMPLANT
FIBER LASER FLEXIVA 200 (UROLOGICAL SUPPLIES) ×1 IMPLANT
FIBER LASER FLEXIVA 365 (UROLOGICAL SUPPLIES) IMPLANT
FIBER LASER FLEXIVA 550 (UROLOGICAL SUPPLIES) IMPLANT
FIBER LASER TRAC TIP (UROLOGICAL SUPPLIES) ×2 IMPLANT
GLOVE BIOGEL M STRL SZ7.5 (GLOVE) ×2 IMPLANT
GOWN STRL REUS W/TWL LRG LVL3 (GOWN DISPOSABLE) ×5 IMPLANT
GUIDEWIRE ANG ZIPWIRE 038X150 (WIRE) IMPLANT
GUIDEWIRE STR DUAL SENSOR (WIRE) ×2 IMPLANT
MANIFOLD NEPTUNE II (INSTRUMENTS) ×2 IMPLANT
PACK CYSTO (CUSTOM PROCEDURE TRAY) ×2 IMPLANT
SHEATH ACCESS URETERAL 38CM (SHEATH) ×1 IMPLANT
STENT URET 6FRX24 CONTOUR (STENTS) ×1 IMPLANT
TUBING CONNECTING 10 (TUBING) ×2 IMPLANT

## 2015-08-24 NOTE — Anesthesia Postprocedure Evaluation (Signed)
Anesthesia Post Note  Patient: John Costa  Procedure(s) Performed: Procedure(s) (LRB): CYSTOSCOPY WITHLEFT RETROGRADE PYELOGRAM, URETEROSCOPY AND STENT PLACEMENT (Left)  WITH HOLMIUM LASER LITHOTRIPSY (Left)  Patient location during evaluation: PACU Anesthesia Type: General Level of consciousness: awake and alert Pain management: pain level controlled Vital Signs Assessment: post-procedure vital signs reviewed and stable Respiratory status: spontaneous breathing, nonlabored ventilation, respiratory function stable and patient connected to nasal cannula oxygen Cardiovascular status: blood pressure returned to baseline and stable Postop Assessment: no signs of nausea or vomiting Anesthetic complications: no    Last Vitals:  Filed Vitals:   08/24/15 1830 08/24/15 1845  BP: 141/75 134/86  Pulse: 89 82  Temp:  37 C  Resp: 19 12    Last Pain:  Filed Vitals:   08/24/15 1847  PainSc: 0-No pain                 Leroy Pettway L

## 2015-08-24 NOTE — Transfer of Care (Signed)
Immediate Anesthesia Transfer of Care Note  Patient: John Costa  Procedure(s) Performed: Procedure(s): CYSTOSCOPY [place], URETEROSCOPY AND STENT PLACEMENT (Left)  WITH HOLMIUM LASER LITHOTRIPSY (Left)  Patient Location: PACU  Anesthesia Type:General  Level of Consciousness: awake, alert  and oriented  Airway & Oxygen Therapy: Patient Spontanous Breathing and Patient connected to face mask oxygen  Post-op Assessment: Report given to RN and Post -op Vital signs reviewed and stable  Post vital signs: Reviewed and stable  Last Vitals:  Filed Vitals:   08/24/15 1454  BP: 147/98  Pulse: 101  Temp: 37.1 C  Resp: 16    Complications: No apparent anesthesia complications

## 2015-08-24 NOTE — Anesthesia Procedure Notes (Signed)
Procedure Name: LMA Insertion Performed by: Hanley Rispoli J Pre-anesthesia Checklist: Patient identified, Emergency Drugs available, Suction available, Patient being monitored and Timeout performed Patient Re-evaluated:Patient Re-evaluated prior to inductionOxygen Delivery Method: Circle system utilized Preoxygenation: Pre-oxygenation with 100% oxygen Intubation Type: IV induction Ventilation: Mask ventilation without difficulty LMA: LMA inserted LMA Size: 4.0 Number of attempts: 1 Placement Confirmation: positive ETCO2,  CO2 detector and breath sounds checked- equal and bilateral Tube secured with: Tape       

## 2015-08-24 NOTE — Discharge Instructions (Addendum)
1. You may see some blood in the urine and may have some burning with urination for 48-72 hours. You also may notice that you have to urinate more frequently or urgently after your procedure which is normal.  2. You should call should you develop an inability urinate, fever > 101, persistent nausea and vomiting that prevents you from eating or drinking to stay hydrated.  3. If you have a stent, you will likely urinate more frequently and urgently until the stent is removed and you may experience some discomfort/pain in the lower abdomen and flank especially when urinating. You may take pain medication prescribed to you if needed for pain. You may also intermittently have blood in the urine until the stent is removed. You may remove your stent on Friday morning by pulling the string that comes out the penis.  This can be done in a warm shower and some urine will come out with the stent.  If you experience any pain afterwards, it is usually self limited and you may take your pain medication and it will typically resolve.     General Anesthesia, Adult, Care After Refer to this sheet in the next few weeks. These instructions provide you with information on caring for yourself after your procedure. Your health care provider may also give you more specific instructions. Your treatment has been planned according to current medical practices, but problems sometimes occur. Call your health care provider if you have any problems or questions after your procedure. WHAT TO EXPECT AFTER THE PROCEDURE After the procedure, it is typical to experience: Sleepiness. Nausea and vomiting. HOME CARE INSTRUCTIONS For the first 24 hours after general anesthesia: Have a responsible person with you. Do not drive a car. If you are alone, do not take public transportation. Do not drink alcohol. Do not take medicine that has not been prescribed by your health care provider. Do not sign important papers or make important  decisions. You may resume a normal diet and activities as directed by your health care provider. Change bandages (dressings) as directed. If you have questions or problems that seem related to general anesthesia, call the hospital and ask for the anesthetist or anesthesiologist on call. SEEK MEDICAL CARE IF: You have nausea and vomiting that continue the day after anesthesia. You develop a rash. SEEK IMMEDIATE MEDICAL CARE IF:  You have difficulty breathing. You have chest pain. You have any allergic problems.   This information is not intended to replace advice given to you by your health care provider. Make sure you discuss any questions you have with your health care provider.   Document Released: 12/12/2000 Document Revised: 09/26/2014 Document Reviewed: 01/04/2012 Elsevier Interactive Patient Education [employer]. 4.

## 2015-08-24 NOTE — Anesthesia Preprocedure Evaluation (Addendum)
Anesthesia Evaluation  Patient identified by MRN, date of birth, ID band Patient awake    Reviewed: Allergy & Precautions, H&P , NPO status , Patient's Chart, lab work & pertinent test results  History of Anesthesia Complications (+) PONV  Airway Mallampati: II  TM Distance: >3 FB Neck ROM: full    Dental no notable dental hx. (+) Dental Advisory Given, Teeth Intact   Pulmonary neg pulmonary ROS,    Pulmonary exam normal breath sounds clear to auscultation       Cardiovascular Exercise Tolerance: Good negative cardio ROS Normal cardiovascular exam Rhythm:regular Rate:Normal     Neuro/Psych  Headaches, History cerebral aneurysm 2008 with headache negative neurological ROS  negative psych ROS   GI/Hepatic negative GI ROS, Neg liver ROS,   Endo/Other  negative endocrine ROS  Renal/GU negative Renal ROS  negative genitourinary   Musculoskeletal   Abdominal   Peds  Hematology negative hematology ROS (+)   Anesthesia Other Findings   Reproductive/Obstetrics negative OB ROS                            Anesthesia Physical Anesthesia Plan  ASA: II  Anesthesia Plan: General   Post-op Pain Management:    Induction: Intravenous  Airway Management Planned: LMA  Additional Equipment:   Intra-op Plan:   Post-operative Plan:   Informed Consent: I have reviewed the patients History and Physical, chart, labs and discussed the procedure including the risks, benefits and alternatives for the proposed anesthesia with the patient or authorized representative who has indicated his/her understanding and acceptance.   Dental Advisory Given  Plan Discussed with: CRNA and Surgeon  Anesthesia Plan Comments:         Anesthesia Quick Evaluation

## 2015-08-24 NOTE — Op Note (Signed)
Preoperative diagnosis: Left renal calculi, left pelvic kidney  Postoperative diagnosis: Left renal calculi, left pelvic kidney  Procedure:  1. Cystoscopy 2. Left ureteroscopy and stone removal 3. Ureteroscopic laser lithotripsy 4. Left ureteral stent placement (6 x 24) 5. Left retrograde pyelography with interpretation  Surgeon: Pryor Curia. M.D.  Anesthesia: General  Complications: None  Intraoperative findings: Left retrograde pyelography demonstrated a filling defect within the left kidney consistent with the patient's known calculi without other abnormalities noted.  EBL: Minimal  Specimens: 1. Left renal calculi  Disposition of specimens: [facility] for stone analysis  Indication: John Costa  is a 49 y.o. patient with urolithiasis and known left renal calculi and a left pelvic kidney. After reviewing the management options for treatment, they elected to proceed with the above surgical procedure(s). We have discussed the potential benefits and risks of the procedure, side effects of the proposed treatment, the likelihood of the patient achieving the goals of the procedure, and any potential problems that might occur during the procedure or recuperation. Informed consent has been obtained.  Description of procedure:  The patient was taken to the operating room and general anesthesia was induced.  The patient was placed in the dorsal lithotomy position, prepped and draped in the usual sterile fashion, and preoperative antibiotics were administered. A preoperative time-out was performed.   Cystourethroscopy was performed.  The patient's urethra was examined and was normal. The bladder was then systematically examined in its entirety. There was no evidence for any bladder tumors, stones, or other mucosal pathology.    Attention then turned to the left ureteral orifice and a ureteral catheter was used to intubate the ureteral orifice.  Omnipaque  contrast was injected through the ureteral catheter and a retrograde pyelogram was performed with findings as dictated above.  A 0.38 sensor guidewire was then advanced up the left ureter into the renal pelvis under fluoroscopic guidance.  A 12/14 Fr ureteral access sheath was then advanced over the guide wire. The digital flexible ureteroscope was then advanced through the access sheath into the ureter next to the guidewire and the calculus was identified and was located in the lower pole lateral calyx.   The stone was then fragmented with the 200 micron holmium laser fiber on a setting of 0.8 J and frequency of 6 Hz.   All sizable stones were then removed with a stone basket.  Reinspection of the ureter/renal pelvis revealed no remaining visible stones or fragments of significant size.   The safety wire was then replaced and the access sheath removed.  The guidewire was backloaded through the cystoscope and a ureteral stent was advance over the wire using Seldinger technique.  The stent was positioned appropriately under fluoroscopic and cystoscopic guidance.  The wire was then removed with an adequate stent curl noted in the renal pelvis as well as in the bladder.  The bladder was then emptied and the procedure ended.  The patient appeared to tolerate the procedure well and without complications.  The patient was able to be awakened and transferred to the recovery unit in satisfactory condition.   Pryor Curia MD

## 2015-08-25 ENCOUNTER — Encounter (HOSPITAL_COMMUNITY): Payer: Self-pay | Admitting: Urology

## 2015-08-29 ENCOUNTER — Emergency Department (HOSPITAL_COMMUNITY)
Admission: EM | Admit: 2015-08-29 | Discharge: 2015-08-30 | Disposition: A | Discharge status: Home / Self Care | Payer: BC Managed Care – PPO | Attending: Emergency Medicine | Admitting: Emergency Medicine

## 2015-08-29 ENCOUNTER — Emergency Department (HOSPITAL_COMMUNITY): Payer: BC Managed Care – PPO

## 2015-08-29 ENCOUNTER — Encounter (HOSPITAL_COMMUNITY): Payer: Self-pay | Admitting: *Deleted

## 2015-08-29 DIAGNOSIS — R339 Retention of urine, unspecified: Secondary | ICD-10-CM | POA: Diagnosis present

## 2015-08-29 DIAGNOSIS — N50812 Left testicular pain: Secondary | ICD-10-CM | POA: Insufficient documentation

## 2015-08-29 DIAGNOSIS — F419 Anxiety disorder, unspecified: Secondary | ICD-10-CM | POA: Diagnosis not present

## 2015-08-29 DIAGNOSIS — Z79899 Other long term (current) drug therapy: Secondary | ICD-10-CM | POA: Insufficient documentation

## 2015-08-29 DIAGNOSIS — F329 Major depressive disorder, single episode, unspecified: Secondary | ICD-10-CM | POA: Diagnosis not present

## 2015-08-29 DIAGNOSIS — N1339 Other hydronephrosis: Secondary | ICD-10-CM | POA: Diagnosis not present

## 2015-08-29 DIAGNOSIS — N390 Urinary tract infection, site not specified: Secondary | ICD-10-CM | POA: Insufficient documentation

## 2015-08-29 DIAGNOSIS — Z87442 Personal history of urinary calculi: Secondary | ICD-10-CM | POA: Insufficient documentation

## 2015-08-29 DIAGNOSIS — K219 Gastro-esophageal reflux disease without esophagitis: Secondary | ICD-10-CM | POA: Insufficient documentation

## 2015-08-29 DIAGNOSIS — Z8701 Personal history of pneumonia (recurrent): Secondary | ICD-10-CM | POA: Diagnosis not present

## 2015-08-29 DIAGNOSIS — Z8679 Personal history of other diseases of the circulatory system: Secondary | ICD-10-CM | POA: Diagnosis not present

## 2015-08-29 DIAGNOSIS — Z791 Long term (current) use of non-steroidal anti-inflammatories (NSAID): Secondary | ICD-10-CM | POA: Insufficient documentation

## 2015-08-29 DIAGNOSIS — R319 Hematuria, unspecified: Secondary | ICD-10-CM

## 2015-08-29 LAB — CBC WITH DIFFERENTIAL/PLATELET
Basophils Absolute: 0 10*3/uL (ref 0.0–0.1)
Basophils Relative: 0 %
Eosinophils Absolute: 0.1 10*3/uL (ref 0.0–0.7)
Eosinophils Relative: 1 %
HCT: 42 % (ref 39.0–52.0)
Hemoglobin: 14.5 g/dL (ref 13.0–17.0)
Lymphocytes Relative: 8 %
Lymphs Abs: 1 10*3/uL (ref 0.7–4.0)
MCH: 29.6 pg (ref 26.0–34.0)
MCHC: 34.5 g/dL (ref 30.0–36.0)
MCV: 85.7 fL (ref 78.0–100.0)
Monocytes Absolute: 0.7 10*3/uL (ref 0.1–1.0)
Monocytes Relative: 6 %
Neutro Abs: 9.8 10*3/uL — ABNORMAL HIGH (ref 1.7–7.7)
Neutrophils Relative %: 85 %
Platelets: 222 10*3/uL (ref 150–400)
RBC: 4.9 MIL/uL (ref 4.22–5.81)
RDW: 14.4 % (ref 11.5–15.5)
WBC: 11.6 10*3/uL — ABNORMAL HIGH (ref 4.0–10.5)

## 2015-08-29 MED ORDER — ONDANSETRON HCL 4 MG/2ML IJ SOLN
4.0000 mg | Freq: Once | INTRAMUSCULAR | Status: AC
  Administered 2015-08-29: 4 mg via INTRAVENOUS
  Filled 2015-08-29: qty 2

## 2015-08-29 MED ORDER — HYDROMORPHONE HCL 1 MG/ML IJ SOLN
1.0000 mg | Freq: Once | INTRAMUSCULAR | Status: AC
  Administered 2015-08-29: 1 mg via INTRAVENOUS
  Filled 2015-08-29: qty 1

## 2015-08-29 NOTE — ED Provider Notes (Signed)
CSN: [id number]     Arrival date & time 08/29/15  2243 History  By signing my name below, I, John Costa, attest that this documentation has been prepared under the direction and in the presence of John Greek, MD. Electronically Signed: Altamease Costa, ED Scribe. 08/29/2015. 11:11 PM     Chief Complaint  Patient presents with  . Urinary Retention    The history is provided by the patient. No language interpreter was used.    John Costa is a 49 y.o. male with history of nephrolithiasis who presents to the Emergency Department complaining of urinary retention with onset this morning around 11 AM. 5 days ago he had lithotripsy and had the stent removed today.Pt states that he frequently has the urge to urinate and strains hard to pass small amounts of urine and occasionally a "jelly-like" substance. Associated symptoms include lower abdominal pain and left-sided testicular pain.  He last used Pyridium and 2 mg of Dilaudid 4 hours ago.  Pt has had this procedure performed in the past without similar issue. He denies back pain.   Past Medical History  Diagnosis Date  . Headache(784.0)     from cerebral aneurysm  2008  . GERD (gastroesophageal reflux disease)     occasional  . Anxiety   . Pneumonia     2010  . Depression     no treatment  . Allergy   . CHOLELITHIASIS 06/12/2007    Qualifier: Diagnosis of  By: Scherrie Gerlach    . PONV (postoperative nausea and vomiting)   . Nephrolithiasis 09/19/2010  . Cerebral aneurysm    Past Surgical History  Procedure Laterality Date  . Knee arthroscopies      has had this several times  . Stone extraction with basket  12/30/10  . Eye surgery      cataracts with lens implants  . Arterial aneurysm repair  2008  . Cystoscopy with retrograde pyelogram, ureteroscopy and stent placement Left 08/24/2015    Procedure: CYSTOSCOPY WITHLEFT RETROGRADE PYELOGRAM, URETEROSCOPY AND STENT PLACEMENT;  Surgeon: Raynelle Bring, MD;   Location: [facility];  Service: Urology;  Laterality: Left;  . Holmium laser application Left [id number]    Procedure:  WITH HOLMIUM LASER LITHOTRIPSY;  Surgeon: Raynelle Bring, MD;  Location: [facility];  Service: Urology;  Laterality: Left;   Family History  Problem Relation Age of Onset  . Prostate cancer Father 2  . Diabetes Brother    Social History  Substance Use Topics  . Smoking status: Never Smoker   . Smokeless tobacco: Never Used  . Alcohol Use: 1.2 - 1.8 oz/week    2-3 Standard drinks or equivalent per week     Comment: occasional    Review of Systems  Gastrointestinal: Positive for abdominal pain.  Genitourinary: Positive for urgency, decreased urine volume, difficulty urinating and testicular pain.  All other systems reviewed and are negative.   Allergies  Cheese and Vicodin  Home Medications   Prior to Admission medications   Medication Sig Start Date End Date Taking? Authorizing Provider  allopurinol (ZYLOPRIM) 100 MG tablet TAKE 1 TABLET (100 MG TOTAL) BY MOUTH DAILY. 12/08/14   Marin Olp, MD  buPROPion (WELLBUTRIN XL) 150 MG 24 hr tablet TAKE 1 TABLET (150 MG TOTAL) BY MOUTH DAILY. 06/17/15   Marin Olp, MD  ciprofloxacin (CIPRO) 500 MG tablet Take 1 tablet (500 mg total) by mouth 2 (two) times daily. 08/24/15   Raynelle Bring, MD  clonazePAM Bobbye [place]) 0.5  MG tablet Take 1 tablet (0.5 mg total) by mouth 2 (two) times daily as needed for anxiety. 07/06/15   Marin Olp, MD  colchicine (COLCRYS) 0.6 MG tablet TAKE 1 TABLET BY MOUTH TWICE DAILY Patient taking differently: Take 0.6 mg by mouth 2 (two) times daily as needed (gout).  07/27/15   [name], DPM  diphenhydrAMINE (BENADRYL) 25 MG tablet Take 25 mg by mouth at bedtime. For sleep    Historical Provider, MD  HYDROmorphone (DILAUDID) 2 MG tablet Take 1 tablet (2 mg total) by mouth every 4 (four) hours as needed for severe pain. 08/24/15   Raynelle Bring, MD  phenazopyridine (PYRIDIUM) 100 MG  tablet Take 1 tablet (100 mg total) by mouth 3 (three) times daily as needed for pain (for burning). 08/24/15   Raynelle Bring, MD  ranitidine (ZANTAC) 75 MG tablet Take 75 mg by mouth at bedtime.    Historical Provider, MD  traMADol (ULTRAM) 50 MG tablet Take 1 tablet (50 mg total) by mouth every 8 (eight) hours as needed. For pain Patient taking differently: Take 50 mg by mouth every 8 (eight) hours as needed (pain). For pain 07/13/15   Marin Olp, MD   BP 137/89 mmHg  Pulse 130  Temp(Src) 99 F (37.2 C) (Oral)  Resp 22  SpO2 98% Physical Exam  Constitutional: He is oriented to person, place, and time. He appears well-developed and well-nourished. No distress.  HENT:  Head: Normocephalic and atraumatic.  Right Ear: Hearing normal.  Left Ear: Hearing normal.  Nose: Nose normal.  Mouth/Throat: Oropharynx is clear and moist and mucous membranes are normal.  Eyes: Conjunctivae and EOM are normal. Pupils are equal, round, and reactive to light.  Neck: Normal range of motion. Neck supple.  Cardiovascular: Regular rhythm, S1 normal and S2 normal.  Exam reveals no gallop and no friction rub.   No murmur heard. Pulmonary/Chest: Effort normal and breath sounds normal. No respiratory distress. He exhibits no tenderness.  Abdominal: Soft. Normal appearance and bowel sounds are normal. There is no hepatosplenomegaly. There is tenderness. There is no rebound, no guarding, no tenderness at McBurney's point and negative Murphy's sign. No hernia.  Diffuse suprapubic tenderness  Musculoskeletal: Normal range of motion.  Neurological: He is alert and oriented to person, place, and time. He has normal strength. No cranial nerve deficit or sensory deficit. Coordination normal. GCS eye subscore is 4. GCS verbal subscore is 5. GCS motor subscore is 6.  Skin: Skin is warm, dry and intact. No rash noted. No cyanosis.  Psychiatric: He has a normal mood and affect. His speech is normal and behavior is  normal. Thought content normal.  Nursing note and vitals reviewed.   ED Course  Procedures (including critical care time)  DIAGNOSTIC STUDIES: Oxygen Saturation is 98% on RA,  normal by my interpretation.    COORDINATION OF CARE: 11:06 PM Discussed treatment plan which includes lab work, CT renal stone, and pain management with pt at bedside and pt agreed to plan.  Labs Review Labs Reviewed  URINE CULTURE  CBC WITH DIFFERENTIAL/PLATELET  BASIC METABOLIC PANEL  URINALYSIS, ROUTINE W REFLEX MICROSCOPIC (NOT AT [facility])    Imaging Review No results found. I have personally reviewed and evaluated these images and lab results as part of my medical decision-making.   EKG Interpretation None      MDM   Final diagnoses:  None   hydronephrosis Hematuria UTI  Presents to the emergency department for evaluation of progressively  worsening left flank pain. Patient has a history of known kidney stones. Patient underwent lithotripsy earlier in the week. He had a stent in place, it was removed yesterday. Today patient has noticed difficulty with urinating. He reports feeling sense of urgency and frequency, but is only passing small amounts and there is blood and clots in it.  Patient's urinalysis does show hematuria as well as pyuria. He is nitrite positive. Symptoms most likely secondary to the blood clot seen on CT scan, no obstructive uropathy noted. Cannot, however, rule out infection. With recent instrumentation and stent placement, consider infection. Treat with Rocephin, outpatient Ceftin, analgesia. Follow-up with urology. Return for worsening symptoms or fever.  I personally performed the services described in this documentation, which was scribed in my presence. The recorded information has been reviewed and is accurate.    John Greek, MD 08/30/15 [id number]

## 2015-08-29 NOTE — ED Notes (Signed)
Pt states that he had lithotripsy on 12/5 and had the stent removed yesterday; pt states he is having trouble urinating; pt states that he is just dribbling when he goes since around 11am and is passing bloody tissue

## 2015-08-30 LAB — BASIC METABOLIC PANEL
Anion gap: 10 (ref 5–15)
BUN: 15 mg/dL (ref 6–20)
CO2: 27 mmol/L (ref 22–32)
Calcium: 9.8 mg/dL (ref 8.9–10.3)
Chloride: 101 mmol/L (ref 101–111)
Creatinine, Ser: 1.64 mg/dL — ABNORMAL HIGH (ref 0.61–1.24)
GFR calc Af Amer: 56 mL/min — ABNORMAL LOW (ref >60)
GFR calc non Af Amer: 48 mL/min — ABNORMAL LOW (ref >60)
Glucose, Bld: 120 mg/dL — ABNORMAL HIGH (ref 65–99)
Potassium: 3.9 mmol/L (ref 3.5–5.1)
Sodium: 138 mmol/L (ref 135–145)

## 2015-08-30 LAB — URINALYSIS, ROUTINE W REFLEX MICROSCOPIC
Bilirubin Urine: NEGATIVE
Glucose, UA: NEGATIVE mg/dL
Ketones, ur: NEGATIVE mg/dL
Nitrite: POSITIVE — AB
Protein, ur: 30 mg/dL — AB
Specific Gravity, Urine: 1.009 (ref 1.005–1.030)
pH: 7 (ref 5.0–8.0)

## 2015-08-30 LAB — URINE MICROSCOPIC-ADD ON: Bacteria, UA: NONE SEEN

## 2015-08-30 MED ORDER — CEFUROXIME AXETIL 250 MG PO TABS: 250.0000 mg | ORAL_TABLET | Freq: Two times a day (BID) | ORAL | Status: DC

## 2015-08-30 MED ORDER — DEXTROSE 5 % IV SOLN
1.0000 g | Freq: Once | INTRAVENOUS | Status: AC
  Administered 2015-08-30: 1 g via INTRAVENOUS
  Filled 2015-08-30: qty 10

## 2015-08-30 NOTE — Discharge Instructions (Signed)
Hematuria, Adult Hematuria is blood in your urine. It can be caused by a bladder infection, kidney infection, prostate infection, kidney stone, or cancer of your urinary tract. Infections can usually be treated with medicine, and a kidney stone usually will pass through your urine. If neither of these is the cause of your hematuria, further workup to find out the reason may be needed. It is very important that you tell your health care provider about any blood you see in your urine, even if the blood stops without treatment or happens without causing pain. Blood in your urine that happens and then stops and then happens again can be a symptom of a very serious condition. Also, pain is not a symptom in the initial stages of many urinary cancers. HOME CARE INSTRUCTIONS   Drink lots of fluid, 3-4 quarts a day. If you have been diagnosed with an infection, cranberry juice is especially recommended, in addition to large amounts of water.  Avoid caffeine, tea, and carbonated beverages because they tend to irritate the bladder.  Avoid alcohol because it may irritate the prostate.  Take all medicines as directed by your health care provider.  If you were prescribed an antibiotic medicine, finish it all even if you start to feel better.  If you have been diagnosed with a kidney stone, follow your health care provider's instructions regarding straining your urine to catch the stone.  Empty your bladder often. Avoid holding urine for long periods of time.  After a bowel movement, women should cleanse front to back. Use each tissue only once.  Empty your bladder before and after sexual intercourse if you are a male. SEEK MEDICAL CARE IF:  You develop back pain.  You have a fever.  You have a feeling of sickness in your stomach (nausea) or vomiting.  Your symptoms are not better in 3 days. Return sooner if you are getting worse. SEEK IMMEDIATE MEDICAL CARE IF:   You develop severe vomiting and  are unable to keep the medicine down.  You develop severe back or abdominal pain despite taking your medicines.  You begin passing a large amount of blood or clots in your urine.  You feel extremely weak or faint, or you pass out. MAKE SURE YOU:   Understand these instructions.  Will watch your condition.  Will get help right away if you are not doing well or get worse.   This information is not intended to replace advice given to you by your health care provider. Make sure you discuss any questions you have with your health care provider.   Document Released: 09/05/2005 Document Revised: 09/26/2014 Document Reviewed: 05/06/2013 Elsevier Interactive Patient Education 2016 [employer].  Hydronephrosis Hydronephrosis is the enlargement of a kidney due to a blockage that stops urine from flowing out of the body. CAUSES Common causes of this condition include:  A birth (congenital) defect of the kidney.  A congenital defect of the tube through which urine travels (ureter).  Kidney stones.  An enlarged prostate gland.  A tumor.  Cancer of the prostate, bladder, uterus, ovary, or colon.  A blood clot. SYMPTOMS Symptoms of this condition include:  Pain or discomfort in your side (flank).  Swelling of the abdomen.  Pain in the abdomen.  Nausea and vomiting.  Fever.  Pain while passing urine.  Feeling of urgency to urinate.  Frequent urination.  Infection of the urinary tract. In some cases, there are no symptoms. DIAGNOSIS This condition may be diagnosed with:  A medical history.  A physical exam.  Blood and urine tests to check kidney function.  Imaging tests, such as an X-ray, ultrasound, CT scan, or MRI.  A test in which a rigid or flexible telescope (cystoscope) is used to view the site of the blockage. TREATMENT Treatment for this condition depends on where the blockage is located, how long it has been there, and what caused it. The goal of  treatment is to remove the blockage. Treatment options include:  A procedure to put in a soft tube to help drain urine.  Antibiotic medicines to treat or prevent infection.  Shock-wave therapy (lithotripsy) to help eliminate kidney stones. HOME CARE INSTRUCTIONS  Get lots of rest.  Drink enough fluid to keep your urine clear or pale yellow.  If you have a drain in, follow your health care provider's instructions about how to care for it.  Take medicines only as directed by your health care provider.  If you were prescribed an antibiotic medicine, finish all of it even if you start to feel better.  Keep all follow-up visits as directed by your health care provider. This is important. SEEK MEDICAL CARE IF:  You continue to have symptoms after treatment.  You develop new symptoms.  You have a problem with a drainage device.  Your urine becomes cloudy or bloody.  You have a fever. SEEK IMMEDIATE MEDICAL CARE IF:  You have severe flank or abdominal pain.  You develop vomiting and are unable to keep fluids down.   This information is not intended to replace advice given to you by your health care provider. Make sure you discuss any questions you have with your health care provider.   Document Released: 07/03/2007 Document Revised: 01/20/2015 Document Reviewed: 09/01/2014 Elsevier Interactive Patient Education 2016 Elsevier Inc.  Urinary Tract Infection Urinary tract infections (UTIs) can develop anywhere along your urinary tract. Your urinary tract is your body's drainage system for removing wastes and extra water. Your urinary tract includes two kidneys, two ureters, a bladder, and a urethra. Your kidneys are a pair of bean-shaped organs. Each kidney is about the size of your fist. They are located below your ribs, one on each side of your spine. CAUSES Infections are caused by microbes, which are microscopic organisms, including fungi, viruses, and bacteria. These organisms  are so small that they can only be seen through a microscope. Bacteria are the microbes that most commonly cause UTIs. SYMPTOMS  Symptoms of UTIs may vary by age and gender of the patient and by the location of the infection. Symptoms in young women typically include a frequent and intense urge to urinate and a painful, burning feeling in the bladder or urethra during urination. Older women and men are more likely to be tired, shaky, and weak and have muscle aches and abdominal pain. A fever may mean the infection is in your kidneys. Other symptoms of a kidney infection include pain in your back or sides below the ribs, nausea, and vomiting. DIAGNOSIS To diagnose a UTI, your caregiver will ask you about your symptoms. Your caregiver will also ask you to provide a urine sample. The urine sample will be tested for bacteria and white blood cells. White blood cells are made by your body to help fight infection. TREATMENT  Typically, UTIs can be treated with medication. Because most UTIs are caused by a bacterial infection, they usually can be treated with the use of antibiotics. The choice of antibiotic and length of treatment depend on  your symptoms and the type of bacteria causing your infection. HOME CARE INSTRUCTIONS  If you were prescribed antibiotics, take them exactly as your caregiver instructs you. Finish the medication even if you feel better after you have only taken some of the medication.  Drink enough water and fluids to keep your urine clear or pale yellow.  Avoid caffeine, tea, and carbonated beverages. They tend to irritate your bladder.  Empty your bladder often. Avoid holding urine for long periods of time.  Empty your bladder before and after sexual intercourse.  After a bowel movement, women should cleanse from front to back. Use each tissue only once. SEEK MEDICAL CARE IF:   You have back pain.  You develop a fever.  Your symptoms do not begin to resolve within 3  days. SEEK IMMEDIATE MEDICAL CARE IF:   You have severe back pain or lower abdominal pain.  You develop chills.  You have nausea or vomiting.  You have continued burning or discomfort with urination. MAKE SURE YOU:   Understand these instructions.  Will watch your condition.  Will get help right away if you are not doing well or get worse.   This information is not intended to replace advice given to you by your health care provider. Make sure you discuss any questions you have with your health care provider.   Document Released: 06/15/2005 Document Revised: 05/27/2015 Document Reviewed: 10/14/2011 Elsevier Interactive Patient Education [employer].

## 2015-08-31 LAB — URINE CULTURE: Culture: NO GROWTH

## 2015-10-06 ENCOUNTER — Ambulatory Visit (INDEPENDENT_AMBULATORY_CARE_PROVIDER_SITE_OTHER): Payer: BC Managed Care – PPO | Admitting: Family Medicine

## 2015-10-06 ENCOUNTER — Encounter: Payer: Self-pay | Admitting: Family Medicine

## 2015-10-06 VITALS — BP 128/80 | HR 90 | Temp 98.6°F | Wt 234.0 lb

## 2015-10-06 DIAGNOSIS — Z8349 Family history of other endocrine, nutritional and metabolic diseases: Secondary | ICD-10-CM

## 2015-10-06 DIAGNOSIS — R195 Other fecal abnormalities: Secondary | ICD-10-CM | POA: Diagnosis not present

## 2015-10-06 DIAGNOSIS — K921 Melena: Secondary | ICD-10-CM | POA: Diagnosis not present

## 2015-10-06 LAB — CBC
HCT: 42.6 % (ref 39.0–52.0)
Hemoglobin: 14.3 g/dL (ref 13.0–17.0)
MCHC: 33.6 g/dL (ref 30.0–36.0)
MCV: 86.3 fl (ref 78.0–100.0)
Platelets: 229 10*3/uL (ref 150.0–400.0)
RBC: 4.94 Mil/uL (ref 4.22–5.81)
RDW: 16 % — ABNORMAL HIGH (ref 11.5–15.5)
WBC: 9.7 10*3/uL (ref 4.0–10.5)

## 2015-10-06 LAB — COMPREHENSIVE METABOLIC PANEL
ALT: 24 U/L (ref 0–53)
AST: 21 U/L (ref 0–37)
Albumin: 4.6 g/dL (ref 3.5–5.2)
Alkaline Phosphatase: 53 U/L (ref 39–117)
BUN: 13 mg/dL (ref 6–23)
CO2: 30 mEq/L (ref 19–32)
Calcium: 9.9 mg/dL (ref 8.4–10.5)
Chloride: 102 mEq/L (ref 96–112)
Creatinine, Ser: 1.18 mg/dL (ref 0.40–1.50)
GFR: 69.74 mL/min (ref >60.00)
Glucose, Bld: 133 mg/dL — ABNORMAL HIGH (ref 70–99)
Potassium: 3.9 mEq/L (ref 3.5–5.1)
Sodium: 140 mEq/L (ref 135–145)
Total Bilirubin: 2.2 mg/dL — ABNORMAL HIGH (ref 0.2–1.2)
Total Protein: 7.3 g/dL (ref 6.0–8.3)

## 2015-10-06 LAB — POC HEMOCCULT BLD/STL (OFFICE/1-CARD/DIAGNOSTIC): Fecal Occult Blood, POC: POSITIVE — AB

## 2015-10-06 NOTE — Patient Instructions (Signed)
Blood work before you go- make sure no significant anemia. If you have worsening lightheadedness or new chest pain, shortness of breath, crushing fatigue- seek care sooner. We will also test for hemochromatosis today.   We will call you within 48 hoursabout your referral to GI. I have asked them to try to see you this week if not early next week. This may change depending on blood counts

## 2015-10-06 NOTE — Progress Notes (Signed)
Garret Reddish, MD  Subjective:  John Costa is a 50 y.o. year old very pleasant male patient who presents for/with See problem oriented charting ROS- see HPI below  Past Medical History-  Patient Active Problem List   Diagnosis Date Noted  . Aneurysm, carotid artery, internal 02/18/2011    Priority: High  . Panic attacks 11/26/2014    Priority: Medium  . Depression 03/14/2011    Priority: Medium  . Gout 06/12/2007    Priority: Medium  . Allergic rhinitis 11/26/2014    Priority: Low  . Skin lesion 11/26/2014    Priority: Low  . Nephrolithiasis 09/19/2010    Priority: Low  . Pityriasis rosea 01/21/2008    Priority: Low  . Gilbert's disease 06/12/2007    Priority: Low    Medications- reviewed and updated Current Outpatient Prescriptions  Medication Sig Dispense Refill  . allopurinol (ZYLOPRIM) 100 MG tablet TAKE 1 TABLET (100 MG TOTAL) BY MOUTH DAILY. 30 tablet 3  . buPROPion (WELLBUTRIN XL) 150 MG 24 hr tablet TAKE 1 TABLET (150 MG TOTAL) BY MOUTH DAILY. 30 tablet 5  . ranitidine (ZANTAC) 75 MG tablet Take 75 mg by mouth at bedtime.    . clonazePAM (KLONOPIN) 0.5 MG tablet Take 1 tablet (0.5 mg total) by mouth 2 (two) times daily as needed for anxiety. (Patient not taking: Reported on 10/06/2015) 60 tablet 5  . colchicine (COLCRYS) 0.6 MG tablet TAKE 1 TABLET BY MOUTH TWICE DAILY (Patient not taking: Reported on 10/06/2015) 10 tablet 1  . diphenhydrAMINE (BENADRYL) 25 MG tablet Take 25 mg by mouth at bedtime as needed for sleep. Reported on 10/06/2015    . traMADol (ULTRAM) 50 MG tablet Take 1 tablet (50 mg total) by mouth every 8 (eight) hours as needed. For pain (Patient not taking: Reported on 10/06/2015) 60 tablet 3   No current facility-administered medications for this visit.    Objective: BP 128/80 mmHg  Pulse 90  Temp(Src) 98.6 F (37 C)  Wt 234 lb (106.142 kg) Gen: NAD, resting comfortably CV: RRR no murmurs rubs or gallops Lungs: CTAB no crackles,  wheeze, rhonchi Abdomen: abdomen more firm than normal without reported pain and no obvious guarding/nontender/slightly distended/normal bowel sounds. Ext: no edema Skin: warm, dry, no rash Neuro: grossly normal, moves all extremities  Assessment/Plan:  Melena, lightheadedness S: Since he got kidney stones out, back to exercising and doing long walks. Starting on Wednesday (7 days ago), 2 abnormal things. First thing he noticed was a large volume very very dark stool- describes as jet black. Has continued to have 1-2 dark bowel movements a day with some small amounts of brown but mainly dark black. Also has noted with exercise is more lightheaded than normal. Normal activity like walking to car does not cause issues. Running up stairs to get wallet for example makes him lightheaded but normal pace does not cause issues. No lightheadedness with standing quickly. Only new medicine is citrical. No bright red blood in stool prior to yesterday noted small amount- prior anal fissure seemed to reopen with a very hard stool- had to pull to remove.   ROS- no chest pain, shortness of breath. Mild abdominal cramping. No nausea/vomiting. No hematemesis. No blood in urine. O: Results for orders placed or performed in visit on 10/06/15 (from the past 24 hour(s))  POC Hemoccult Bld/Stl (1-Cd Office Dx)     Status: Abnormal   Collection Time: 10/06/15  1:30 PM  Result Value Ref Range   Card #1  Date 10/06/15    Fecal Occult Blood, POC Positive (A) Negative  CBC     Status: Abnormal   Collection Time: 10/06/15  1:32 PM  Result Value Ref Range   WBC 9.7 4.0 - 10.5 K/uL   RBC 4.94 4.22 - 5.81 Mil/uL   Platelets 229.0 150.0 - 400.0 K/uL   Hemoglobin 14.3 13.0 - 17.0 g/dL   HCT 42.6 39.0 - 52.0 %   MCV 86.3 78.0 - 100.0 fl   MCHC 33.6 30.0 - 36.0 g/dL   RDW 16.0 (H) 11.5 - 15.5 %  Comprehensive metabolic panel     Status: Abnormal   Collection Time: 10/06/15  1:32 PM  Result Value Ref Range   Sodium 140  135 - 145 mEq/L   Potassium 3.9 3.5 - 5.1 mEq/L   Chloride 102 96 - 112 mEq/L   CO2 30 19 - 32 mEq/L   Glucose, Bld 133 (H) 70 - 99 mg/dL   BUN 13 6 - 23 mg/dL   Creatinine, Ser 1.18 0.40 - 1.50 mg/dL   Total Bilirubin 2.2 (H) 0.2 - 1.2 mg/dL   Alkaline Phosphatase 53 39 - 117 U/L   AST 21 0 - 37 U/L   ALT 24 0 - 53 U/L   Total Protein 7.3 6.0 - 8.3 g/dL   Albumin 4.6 3.5 - 5.2 g/dL   Calcium 9.9 8.4 - 10.5 mg/dL   GFR 69.74 >60.00 mL/min  A/P: Melena for past week with bright red blood per rectum on exam today. No pain significant pain with rectal exam and I do not see obvious fissure or other cause. Fortunately no drop in hemoglobin per labs. Bilirubin high per his baselien (suspect Gilbert's disease). I have sent referral to GI in hopes to have evaluation within a week if possible. Patient also given strict return precautions. He has also read about some potential links with hemochromatosis and dark stools. He updates me that his brother has hemochromatosis which I was not aware of previously. Due primarily to family history, we will also screen for this. Elevated bilirubin but not LFTs so doubt hemochromatosis the cause for that abnormality  Orders Placed This Encounter  Procedures  . CBC    [place]  . Comprehensive metabolic panel    [place]  . Hemochromatosis DNA-PCR(c282y,h63d)  . Ambulatory referral to Gastroenterology    Referral Priority:  Urgent    Referral Type:  Consultation    Referral Reason:  Specialty Services Required    Number of Visits Requested:  1  . POC Hemoccult Bld/Stl (1-Cd Office Dx)

## 2015-10-07 ENCOUNTER — Encounter: Payer: Self-pay | Admitting: Internal Medicine

## 2015-10-11 LAB — HEMOCHROMATOSIS DNA-PCR(C282Y,H63D)

## 2015-10-12 ENCOUNTER — Other Ambulatory Visit: Payer: Self-pay | Admitting: Family Medicine

## 2015-10-12 ENCOUNTER — Other Ambulatory Visit: Payer: Self-pay | Admitting: Podiatry

## 2015-11-14 ENCOUNTER — Encounter (HOSPITAL_COMMUNITY): Payer: Self-pay | Admitting: Emergency Medicine

## 2015-11-14 ENCOUNTER — Emergency Department (HOSPITAL_COMMUNITY): Payer: BC Managed Care – PPO

## 2015-11-14 ENCOUNTER — Emergency Department (HOSPITAL_COMMUNITY)
Admission: EM | Admit: 2015-11-14 | Discharge: 2015-11-15 | Disposition: A | Discharge status: Home / Self Care | Payer: BC Managed Care – PPO | Attending: Emergency Medicine | Admitting: Emergency Medicine

## 2015-11-14 DIAGNOSIS — Z8679 Personal history of other diseases of the circulatory system: Secondary | ICD-10-CM | POA: Diagnosis not present

## 2015-11-14 DIAGNOSIS — F329 Major depressive disorder, single episode, unspecified: Secondary | ICD-10-CM | POA: Diagnosis not present

## 2015-11-14 DIAGNOSIS — Z8701 Personal history of pneumonia (recurrent): Secondary | ICD-10-CM | POA: Insufficient documentation

## 2015-11-14 DIAGNOSIS — G43909 Migraine, unspecified, not intractable, without status migrainosus: Secondary | ICD-10-CM | POA: Insufficient documentation

## 2015-11-14 DIAGNOSIS — M109 Gout, unspecified: Secondary | ICD-10-CM | POA: Diagnosis not present

## 2015-11-14 DIAGNOSIS — K219 Gastro-esophageal reflux disease without esophagitis: Secondary | ICD-10-CM | POA: Diagnosis not present

## 2015-11-14 DIAGNOSIS — K8010 Calculus of gallbladder with chronic cholecystitis without obstruction: Secondary | ICD-10-CM

## 2015-11-14 DIAGNOSIS — Z8639 Personal history of other endocrine, nutritional and metabolic disease: Secondary | ICD-10-CM | POA: Diagnosis not present

## 2015-11-14 DIAGNOSIS — R1011 Right upper quadrant pain: Secondary | ICD-10-CM | POA: Diagnosis present

## 2015-11-14 DIAGNOSIS — K8000 Calculus of gallbladder with acute cholecystitis without obstruction: Secondary | ICD-10-CM | POA: Diagnosis not present

## 2015-11-14 DIAGNOSIS — Z87440 Personal history of urinary (tract) infections: Secondary | ICD-10-CM | POA: Insufficient documentation

## 2015-11-14 DIAGNOSIS — F419 Anxiety disorder, unspecified: Secondary | ICD-10-CM | POA: Insufficient documentation

## 2015-11-14 DIAGNOSIS — Z79899 Other long term (current) drug therapy: Secondary | ICD-10-CM | POA: Insufficient documentation

## 2015-11-14 LAB — COMPREHENSIVE METABOLIC PANEL
ALT: 23 U/L (ref 17–63)
AST: 22 U/L (ref 15–41)
Albumin: 4.7 g/dL (ref 3.5–5.0)
Alkaline Phosphatase: 75 U/L (ref 38–126)
Anion gap: 9 (ref 5–15)
BUN: 11 mg/dL (ref 6–20)
CO2: 26 mmol/L (ref 22–32)
Calcium: 9.7 mg/dL (ref 8.9–10.3)
Chloride: 103 mmol/L (ref 101–111)
Creatinine, Ser: 1.01 mg/dL (ref 0.61–1.24)
GFR calc Af Amer: >60 mL/min (ref >60)
GFR calc non Af Amer: >60 mL/min (ref >60)
Glucose, Bld: 99 mg/dL (ref 65–99)
Potassium: 3.8 mmol/L (ref 3.5–5.1)
Sodium: 138 mmol/L (ref 135–145)
Total Bilirubin: 2 mg/dL — ABNORMAL HIGH (ref 0.3–1.2)
Total Protein: 8 g/dL (ref 6.5–8.1)

## 2015-11-14 LAB — URINALYSIS, ROUTINE W REFLEX MICROSCOPIC
Bilirubin Urine: NEGATIVE
Glucose, UA: NEGATIVE mg/dL
Hgb urine dipstick: NEGATIVE
Ketones, ur: NEGATIVE mg/dL
Leukocytes, UA: NEGATIVE
Nitrite: NEGATIVE
Protein, ur: NEGATIVE mg/dL
Specific Gravity, Urine: 1.011 (ref 1.005–1.030)
pH: 6.5 (ref 5.0–8.0)

## 2015-11-14 LAB — CBC
HCT: 42.7 % (ref 39.0–52.0)
Hemoglobin: 15 g/dL (ref 13.0–17.0)
MCH: 29.6 pg (ref 26.0–34.0)
MCHC: 35.1 g/dL (ref 30.0–36.0)
MCV: 84.4 fL (ref 78.0–100.0)
Platelets: 331 10*3/uL (ref 150–400)
RBC: 5.06 MIL/uL (ref 4.22–5.81)
RDW: 14.8 % (ref 11.5–15.5)
WBC: 10.8 10*3/uL — ABNORMAL HIGH (ref 4.0–10.5)

## 2015-11-14 LAB — LIPASE, BLOOD: Lipase: 30 U/L (ref 11–51)

## 2015-11-14 MED ORDER — SODIUM CHLORIDE 0.9 % IV BOLUS (SEPSIS)
1000.0000 mL | Freq: Once | INTRAVENOUS | Status: AC
  Administered 2015-11-14: 1000 mL via INTRAVENOUS

## 2015-11-14 MED ORDER — ONDANSETRON HCL 4 MG/2ML IJ SOLN
4.0000 mg | Freq: Once | INTRAMUSCULAR | Status: AC
  Administered 2015-11-14: 4 mg via INTRAVENOUS
  Filled 2015-11-14: qty 2

## 2015-11-14 MED ORDER — MORPHINE SULFATE (PF) 4 MG/ML IV SOLN
4.0000 mg | Freq: Once | INTRAVENOUS | Status: AC
  Administered 2015-11-14: 4 mg via INTRAVENOUS
  Filled 2015-11-14: qty 1

## 2015-11-14 NOTE — ED Notes (Signed)
Pt reports LUQ pain since last night accompanied by n/v. No hx of gallbladder issues. Pain radiates to L shoulder blade.

## 2015-11-14 NOTE — ED Provider Notes (Signed)
CSN: [id number]     Arrival date & time 11/14/15  1711 History   First MD Initiated Contact with Patient 11/14/15 2218     Chief Complaint  Patient presents with  . Abdominal Pain    John Costa is a 50 y.o. male who presents to the ED Complaining of right upper quadrant abdominal pain since last night. He currently complains of 6 out of 10 on abdominal pain that is worse after eating. He reports nausea and vomiting. He has vomited 6 times today. He reports lots of associated burping and belching. Reports his pain is better with a heating pad. No previous abdominal surgeries. He has taken nothing for treatment today. Patient denies fevers, hematemesis, diarrhea, hematochezia, chest pain, shortness of breath, urinary symptoms, or rashes.  Patient is a 50 y.o. male presenting with abdominal pain. The history is provided by the patient. No language interpreter was used.  Abdominal Pain Associated symptoms: nausea and vomiting   Associated symptoms: no chest pain, no chills, no cough, no diarrhea, no dysuria, no fever, no hematuria, no shortness of breath and no sore throat     Past Medical History  Diagnosis Date  . Headache(784.0)     from cerebral aneurysm  2008  . GERD (gastroesophageal reflux disease)     occasional  . Anxiety   . Pneumonia     2010  . Depression     no treatment  . Allergy   . CHOLELITHIASIS 06/12/2007    Qualifier: Diagnosis of  By: Scherrie Gerlach    . PONV (postoperative nausea and vomiting)   . Nephrolithiasis 09/19/2010  . Cerebral aneurysm   . Gout   . Gilbert's syndrome   . Pelvic kidney   . Migraines    Past Surgical History  Procedure Laterality Date  . Knee arthroscopies      has had this several times  . Stone extraction with basket  12/30/10  . Eye surgery      cataracts with lens implants  . Arterial aneurysm repair  2008  . Cystoscopy with retrograde pyelogram, ureteroscopy and stent placement Left 08/24/2015    Procedure: CYSTOSCOPY  WITHLEFT RETROGRADE PYELOGRAM, URETEROSCOPY AND STENT PLACEMENT;  Surgeon: Raynelle Bring, MD;  Location: [facility];  Service: Urology;  Laterality: Left;  . Holmium laser application Left [id number]    Procedure:  WITH HOLMIUM LASER LITHOTRIPSY;  Surgeon: Raynelle Bring, MD;  Location: [facility];  Service: Urology;  Laterality: Left;   Family History  Problem Relation Age of Onset  . Prostate cancer Father 74  . Diabetes Brother   . Hemochromatosis Brother   . Colon cancer Neg Hx   . Diabetes Sister   . Hemachromatosis Sister    Social History  Substance Use Topics  . Smoking status: Never Smoker   . Smokeless tobacco: Never Used  . Alcohol Use: 1.2 - 1.8 oz/week    2-3 Standard drinks or equivalent per week     Comment: occasional    Review of Systems  Constitutional: Negative for fever and chills.  HENT: Negative for congestion and sore throat.   Eyes: Negative for visual disturbance.  Respiratory: Negative for cough and shortness of breath.   Cardiovascular: Negative for chest pain.  Gastrointestinal: Positive for nausea, vomiting and abdominal pain. Negative for diarrhea and blood in stool.  Genitourinary: Negative for dysuria, hematuria and difficulty urinating.  Musculoskeletal: Negative for back pain and neck pain.  Skin: Negative for rash.  Neurological: Negative for light-headedness  and headaches.      Allergies  Cheese and Vicodin  Home Medications   Prior to Admission medications   Medication Sig Start Date End Date Taking? Authorizing Provider  allopurinol (ZYLOPRIM) 100 MG tablet TAKE 1 TABLET (100 MG TOTAL) BY MOUTH DAILY. 12/08/14  Yes Marin Olp, MD  buPROPion (WELLBUTRIN XL) 150 MG 24 hr tablet TAKE 1 TABLET (150 MG TOTAL) BY MOUTH DAILY. 06/17/15  Yes Marin Olp, MD  diphenhydrAMINE (BENADRYL) 25 MG tablet Take 25 mg by mouth at bedtime as needed for sleep. Reported on 10/06/2015   Yes Historical Provider, MD  ibuprofen (ADVIL,MOTRIN) 200 MG tablet  Take 800 mg by mouth every 6 (six) hours as needed for moderate pain.   Yes Historical Provider, MD  phenylephrine (SUDAFED PE) 10 MG TABS tablet Take 10 mg by mouth every 4 (four) hours as needed (for congestion).   Yes Historical Provider, MD  ranitidine (ZANTAC) 75 MG tablet Take 75 mg by mouth daily.    Yes Historical Provider, MD  traMADol (ULTRAM) 50 MG tablet Take 1 tablet (50 mg total) by mouth every 8 (eight) hours as needed. For pain 07/13/15  Yes Marin Olp, MD  clonazePAM (KLONOPIN) 0.5 MG tablet Take 1 tablet (0.5 mg total) by mouth 2 (two) times daily as needed for anxiety. Patient not taking: Reported on 10/06/2015 07/06/15   Marin Olp, MD  colchicine (COLCRYS) 0.6 MG tablet TAKE 1 TABLET BY MOUTH TWICE DAILY Patient not taking: Reported on 10/06/2015 07/27/15   [name], DPM  escitalopram (LEXAPRO) 10 MG tablet TAKE 1 TABLET EVERY DAY Patient not taking: Reported on 11/14/2015 10/13/15   Marin Olp, MD  ondansetron (ZOFRAN ODT) 4 MG disintegrating tablet Take 1 tablet (4 mg total) by mouth every 8 (eight) hours as needed for nausea or vomiting. 11/15/15   Waynetta Pean, PA-C  oxyCODONE-acetaminophen (PERCOCET/ROXICET) 5-325 MG tablet Take 1-2 tablets by mouth every 4 (four) hours as needed for moderate pain or severe pain. 11/15/15   Waynetta Pean, PA-C   BP 130/92 mmHg  Pulse 78  Temp(Src) 98.9 F (37.2 C) (Oral)  Resp 16  SpO2 94% Physical Exam  Constitutional: He appears well-developed and well-nourished. No distress.  Nontoxic appearing.  HENT:  Head: Normocephalic and atraumatic.  Mouth/Throat: Oropharynx is clear and moist.  Eyes: Conjunctivae are normal. Pupils are equal, round, and reactive to light. Right eye exhibits no discharge. Left eye exhibits no discharge.  Neck: Neck supple.  Cardiovascular: Normal rate, regular rhythm, normal heart sounds and intact distal pulses.  Exam reveals no gallop and no friction rub.   No murmur  heard. Heart rate is 88.  Pulmonary/Chest: Effort normal and breath sounds normal. No respiratory distress. He has no wheezes. He has no rales.  Abdominal: Soft. Bowel sounds are normal. He exhibits no distension and no mass. There is tenderness. There is guarding. There is no rebound.  Abdomen soft. Bowel sounds are present. Patient has moderate right upper quadrant abdominal tenderness to palpation with positive Murphy's sign. No lower abdominal pain. No peritoneal signs.  Musculoskeletal: He exhibits no edema.  Lymphadenopathy:    He has no cervical adenopathy.  Neurological: He is alert. Coordination normal.  Skin: Skin is warm and dry. No rash noted. He is not diaphoretic. No erythema. No pallor.  Psychiatric: He has a normal mood and affect. His behavior is normal.  Nursing note and vitals reviewed.   ED Course  Procedures (including critical  care time) Labs Review Labs Reviewed  COMPREHENSIVE METABOLIC PANEL - Abnormal; Notable for the following:    Total Bilirubin 2.0 (*)    All other components within normal limits  CBC - Abnormal; Notable for the following:    WBC 10.8 (*)    All other components within normal limits  LIPASE, BLOOD  URINALYSIS, ROUTINE W REFLEX MICROSCOPIC (NOT AT [facility])    Imaging Review US Abdomen Limited Ruq  11/15/2015  CLINICAL DATA:  RIGHT upper quadrant pain, nausea and vomiting. Known nephrolithiasis in LEFT pelvic kidney and cholelithiasis. EXAM: US ABDOMEN LIMITED - RIGHT UPPER QUADRANT COMPARISON:  Abdominal ultrasound September 22, 2015 and CT abdomen and pelvis August 29, 2015 FINDINGS: Gallbladder: Multiple echogenic gallstones with acoustic shadowing. Mild gallbladder wall thickening at 3.8 mm. No pericholecystic fluid. No sonographic Murphy's sign elicited. Common bile duct: Diameter: 5 mm Liver: Diffusely mildly echogenic without intrahepatic biliary dilatation. Hepatopetal portal vein. IMPRESSION: Cholelithiasis and mild gallbladder wall  thickening suggests chronic cholecystitis without acute component. Hepatic steatosis. Electronically Signed   By: Elon Alas M.D.   On: 11/15/2015 00:27   I have personally reviewed and evaluated these images and lab results as part of my medical decision-making.   EKG Interpretation None      Filed Vitals:   11/14/15 1729 11/14/15 2235 11/15/15 0056  BP: 150/107 146/104 130/92  Pulse: 114 90 78  Temp: 98.9 F (37.2 C)    TempSrc: Oral    Resp: 20 16 16   SpO2: 98% 94% 94%     MDM   Meds given in ED:  Medications  sodium chloride 0.9 % bolus 1,000 mL (0 mLs Intravenous Stopped 11/15/15 0051)  ondansetron (ZOFRAN) injection 4 mg (4 mg Intravenous Given 11/14/15 2240)  morphine 4 MG/ML injection 4 mg (4 mg Intravenous Given 11/14/15 2240)  ondansetron (ZOFRAN) injection 4 mg (4 mg Intravenous Given 11/15/15 0051)  morphine 4 MG/ML injection 4 mg (4 mg Intravenous Given 11/15/15 0051)    New Prescriptions   ONDANSETRON (ZOFRAN ODT) 4 MG DISINTEGRATING TABLET    Take 1 tablet (4 mg total) by mouth every 8 (eight) hours as needed for nausea or vomiting.   OXYCODONE-ACETAMINOPHEN (PERCOCET/ROXICET) 5-325 MG TABLET    Take 1-2 tablets by mouth every 4 (four) hours as needed for moderate pain or severe pain.    Final diagnoses:  Calculus of gallbladder with cholecystitis without biliary obstruction, unspecified cholecystitis acuity   This is a 50 y.o. male who presents to the ED Complaining of right upper quadrant abdominal pain since last night. He currently complains of 6 out of 10 on abdominal pain that is worse after eating. He reports nausea and vomiting. He has vomited 6 times today. He reports lots of associated burping and belching. On exam patient is afebrile and nontoxic appearing. His abdomen is soft and has right upper quadrant abdominal tenderness to palpation. Lipase is within normal limits. Urinalysis is clean. CMP is remarkable only for total bilirubin of 2.0. CBC  shows a WBC of 10,000. Ultrasound reveals cholelithiasis and mild gallbladder wall thickening suggestive of chronic cholecystitis without acute component. After Zofran and pain control the patient reports feeling much better. He is tolerating ginger ale. He is comfortable going home. Will send him home with prescriptions for Percocet and Zofran and have him follow-up closely with his primary care provider and general surgery for likely cholecystectomy. I discussed strict and specific return precautions. I advised the patient to follow-up with their primary care  provider this week. I advised the patient to return to the emergency department with new or worsening symptoms or new concerns. The patient verbalized understanding and agreement with plan.        Waynetta Pean, PA-C 11/15/15 0139  Lacretia Leigh, MD 11/15/15 2150

## 2015-11-15 ENCOUNTER — Other Ambulatory Visit: Payer: Self-pay | Admitting: Family Medicine

## 2015-11-15 MED ORDER — MORPHINE SULFATE (PF) 4 MG/ML IV SOLN
4.0000 mg | Freq: Once | INTRAVENOUS | Status: AC
  Administered 2015-11-15: 4 mg via INTRAVENOUS
  Filled 2015-11-15: qty 1

## 2015-11-15 MED ORDER — OXYCODONE-ACETAMINOPHEN 5-325 MG PO TABS: 1.0000 | ORAL_TABLET | ORAL | Status: DC | PRN

## 2015-11-15 MED ORDER — ONDANSETRON HCL 4 MG/2ML IJ SOLN
4.0000 mg | Freq: Once | INTRAMUSCULAR | Status: AC
  Administered 2015-11-15: 4 mg via INTRAVENOUS
  Filled 2015-11-15: qty 2

## 2015-11-15 MED ORDER — ONDANSETRON 4 MG PO TBDP: 4.0000 mg | ORAL_TABLET | Freq: Three times a day (TID) | ORAL | Status: DC | PRN

## 2015-11-15 NOTE — Discharge Instructions (Signed)
Biliary Colic Biliary colic is a pain in the upper abdomen. The pain:  Is usually felt on the right side of the abdomen, but it may also be felt in the center of the abdomen, just below the breastbone (sternum).  May spread back toward the right shoulder blade.  May be steady or irregular.  May be accompanied by nausea and vomiting. Most of the time, the pain goes away in 1-5 hours. After the most intense pain passes, the abdomen may continue to ache mildly for about 24 hours. Biliary colic is caused by a blockage in the bile duct. The bile duct is a pathway that carries bile--a liquid that helps to digest fats--from the gallbladder to the small intestine. Biliary colic usually occurs after eating, when the digestive system demands bile. The pain develops when muscle cells contract forcefully to try to move the blockage so that bile can get by. HOME CARE INSTRUCTIONS  Take medicines only as directed by your health care provider.  Drink enough fluid to keep your urine clear or pale yellow.  Avoid fatty, greasy, and fried foods. These kinds of foods increase your body's demand for bile.  Avoid any foods that make your pain worse.  Avoid overeating.  Avoid having a large meal after fasting. SEEK MEDICAL CARE IF:  You develop a fever.  Your pain gets worse.  You vomit.  You develop nausea that prevents you from eating and drinking. SEEK IMMEDIATE MEDICAL CARE IF:  You suddenly develop a fever and shaking chills.  You develop a yellowish discoloration (jaundice) of:  Skin.  Whites of the eyes.  Mucous membranes.  You have continuous or severe pain that is not relieved with medicines.  You have nausea and vomiting that is not relieved with medicines.  You develop dizziness or you faint.   This information is not intended to replace advice given to you by your health care provider. Make sure you discuss any questions you have with your health care provider.   Document  Released: 02/06/2006 Document Revised: 01/20/2015 Document Reviewed: 06/17/2014 Elsevier Interactive Patient Education 2016 [employer] Cholecystitis Cholecystitis is inflammation of the gallbladder. It is often called a gallbladder attack. The gallbladder is a pear-shaped organ that lies beneath the liver on the right side of the body. The gallbladder stores bile, which is a fluid that helps the body to digest fats. If bile builds up in your gallbladder, your gallbladder becomes inflamed. This condition may occur suddenly (be acute). Repeat episodes of acute cholecystitis or prolonged episodes may lead to a long-term (chronic) condition. Cholecystitis is serious and it requires treatment.  CAUSES The most common cause of this condition is gallstones. Gallstones can block the tube (duct) that carries bile out of your gallbladder. This causes bile to build up. Other causes of this condition include: Damage to the gallbladder due to a decrease in blood flow. Infections in the bile ducts. Scars or kinks in the bile ducts. Tumors in the liver, pancreas, or gallbladder. RISK FACTORS This condition is more likely to develop in: People who have sickle cell disease. People who take birth control pills or use estrogen. People who have alcoholic liver disease. People who have liver cirrhosis. People who have their nutrition delivered through a vein (parenteral nutrition). People who do not eat or drink (do fasting) for a long period of time. People who are obese. People who have rapid weight loss. People who are pregnant. People who have increased triglyceride levels. People who have pancreatitis.  SYMPTOMS Symptoms of this condition include: Abdominal pain, especially in the upper right area of the abdomen. Abdominal tenderness or bloating. Nausea. Vomiting. Fever. Chills. Yellowing of the skin and the whites of the eyes (jaundice). DIAGNOSIS This condition is diagnosed with a medical  history and physical exam. You may also have other tests, including: Imaging tests, such as: An ultrasound of the gallbladder. A CT scan of the abdomen. A gallbladder nuclear scan (HIDA scan). This scan allows your health care provider to see the bile moving from your liver to your gallbladder and to your small intestine. MRI. Blood tests, such as: A complete blood count, because the white blood cell count may be higher than normal. Liver function tests, because some levels may be higher than normal with certain types of gallstones. TREATMENT Treatment may include: Fasting for a certain amount of time. IV fluids. Medicine to treat pain or vomiting. Antibiotic medicine. Surgery to remove your gallbladder (cholecystectomy). This may happen immediately or at a later time. [place] care will depend on your treatment. In general: Take over-the-counter and prescription medicines only as told by your health care provider. If you were prescribed an antibiotic medicine, take it as told by your health care provider. Do not stop taking the antibiotic even if you start to feel better. Follow instructions from your health care provider about what to eat or drink. When you are allowed to eat, avoid eating or drinking anything that triggers your symptoms. Keep all follow-up visits as told by your health care provider. This is important. SEEK MEDICAL CARE IF: Your pain is not controlled with medicine. You have a fever. SEEK IMMEDIATE MEDICAL CARE IF: Your pain moves to another part of your abdomen or to your back. You continue to have symptoms or you develop new symptoms even with treatment.   This information is not intended to replace advice given to you by your health care provider. Make sure you discuss any questions you have with your health care provider.   Document Released: 09/05/2005 Document Revised: 05/27/2015 Document Reviewed: 12/17/2014 Elsevier Interactive Patient  Education 2016 [place] Diet for Pancreatitis or Gallbladder Conditions A low-fat diet can be helpful if you have pancreatitis or a gallbladder condition. With these conditions, your pancreas and gallbladder have trouble digesting fats. A healthy eating plan with less fat will help rest your pancreas and gallbladder and reduce your symptoms. WHAT DO I NEED TO KNOW ABOUT THIS DIET? Eat a low-fat diet. Reduce your fat intake to less than 20-30% of your total daily calories. This is less than 50-60 g of fat per day. Remember that you need some fat in your diet. Ask your dietician what your daily goal should be. Choose nonfat and low-fat healthy foods. Look for the words "nonfat," "low fat," or "fat free." As a guide, look on the label and choose foods with less than 3 g of fat per serving. Eat only one serving. Avoid alcohol. Do not smoke. If you need help quitting, talk with your health care provider. Eat small frequent meals instead of three large heavy meals. WHAT FOODS CAN I EAT? Grains Include healthy grains and starches such as potatoes, wheat bread, fiber-rich cereal, and brown rice. Choose whole grain options whenever possible. In adults, whole grains should account for 45-65% of your daily calories.  Fruits and Vegetables Eat plenty of fruits and vegetables. Fresh fruits and vegetables add fiber to your diet. Meats and Other Protein Sources Eat lean  meat such as chicken and pork. Trim any fat off of meat before cooking it. Eggs, fish, and beans are other sources of protein. In adults, these foods should account for 10-35% of your daily calories. Dairy Choose low-fat milk and dairy options. Dairy includes fat and protein, as well as calcium.  Fats and Oils Limit high-fat foods such as fried foods, sweets, baked goods, sugary drinks.  Other Creamy sauces and condiments, such as mayonnaise, can add extra fat. Think about whether or not you need to use them, or use smaller  amounts or low fat options. WHAT FOODS ARE NOT RECOMMENDED? High fat foods, such as: [employer]. Ice cream. [place] toast. Sweet rolls. Pizza. Cheese bread. Foods covered with batter, butter, creamy sauces, or cheese. Fried foods. Sugary drinks and desserts. Foods that cause gas or bloating   This information is not intended to replace advice given to you by your health care provider. Make sure you discuss any questions you have with your health care provider.   Document Released: 09/10/2013 Document Reviewed: 09/10/2013 Elsevier Interactive Patient Education [employer].

## 2015-11-16 ENCOUNTER — Telehealth: Payer: Self-pay | Admitting: Family Medicine

## 2015-11-16 NOTE — Telephone Encounter (Signed)
Sounds good thanks

## 2015-11-16 NOTE — Telephone Encounter (Signed)
Pt seen in ED this weekend for gall bladder and advised to follow up Dr Yong Channel in 3 days. Please advise where to schedule. Thanks!

## 2015-11-16 NOTE — Telephone Encounter (Signed)
Someone used the 9:15., I used the 3:45 , Thursday, 30 min, hope that was ok.

## 2015-11-16 NOTE — Telephone Encounter (Signed)
See below

## 2015-11-16 NOTE — Telephone Encounter (Signed)
Thursday 9:15 AM?

## 2015-11-19 ENCOUNTER — Encounter: Payer: Self-pay | Admitting: Family Medicine

## 2015-11-19 ENCOUNTER — Ambulatory Visit (INDEPENDENT_AMBULATORY_CARE_PROVIDER_SITE_OTHER): Payer: BC Managed Care – PPO | Admitting: Family Medicine

## 2015-11-19 VITALS — BP 140/84 | HR 87 | Temp 99.0°F | Wt 234.0 lb

## 2015-11-19 DIAGNOSIS — K8012 Calculus of gallbladder with acute and chronic cholecystitis without obstruction: Secondary | ICD-10-CM

## 2015-11-19 DIAGNOSIS — E669 Obesity, unspecified: Secondary | ICD-10-CM | POA: Diagnosis not present

## 2015-11-19 NOTE — Progress Notes (Signed)
The duration of face-to-face time during this visit was 25 minutes. Greater than 50% of this time was spent in counseling, explanation of diagnosis, planning of further management, and/or coordination of care.   Cholelithiasis We discussed his potential chronic cholecystitis. Reviewed uptodate article stating no clear increase in morbidity with chronic cholecystitis. He last had an episode of pain 20 years ago and had known gallstones then- he would much prefer to not have surgery. He has appointment with surgery on 12/03/15 for their opinion as well. Also advised he could mention to Dr. Carlean Purl who will be doing colonoscopy and EGD likely on 14th- consider in office consultation for medication dissolution therapy for gallstone (with his Gilber'ts not sure dissolution therapy or surgery is ideal for him). We discussed bland/healthier diet as potential decreased risk  He is able to complete 4 METS withotu chest pain or shortness of breath. He would be medically maximized for surgery as long as BP better on follow up- but has been in past BP Readings from Last 3 Encounters:  11/19/15 140/84  11/15/15 118/81  10/06/15 128/80   Obesity Hepatic Steatosis States not losing weight despit 1600 calories per day and walking 3-5 miles a day for 3-4 months and also rowing in basement 3-5 x a week.  Weight is not budging. Discussed continued efforts another 3-6 months and hoepfully lbs will begin to come off. Discussed medication potentially if this is not effective. Needs to continue healthy habits for his liver

## 2015-11-19 NOTE — Patient Instructions (Signed)
Follow through with surgical consultation with expressing your desire for alternatives or avoiding surgery

## 2015-11-25 ENCOUNTER — Other Ambulatory Visit: Payer: Self-pay

## 2015-11-25 ENCOUNTER — Encounter: Payer: Self-pay | Admitting: Family Medicine

## 2015-11-25 MED ORDER — COLCHICINE 0.6 MG PO TABS: ORAL_TABLET | ORAL | Status: DC

## 2015-12-01 ENCOUNTER — Encounter: Payer: Self-pay | Admitting: Internal Medicine

## 2015-12-01 ENCOUNTER — Ambulatory Visit (INDEPENDENT_AMBULATORY_CARE_PROVIDER_SITE_OTHER): Payer: BC Managed Care – PPO | Admitting: Internal Medicine

## 2015-12-01 VITALS — BP 110/70 | HR 104 | Ht 69.0 in | Wt 232.0 lb

## 2015-12-01 DIAGNOSIS — R6881 Early satiety: Secondary | ICD-10-CM | POA: Diagnosis not present

## 2015-12-01 DIAGNOSIS — R61 Generalized hyperhidrosis: Secondary | ICD-10-CM | POA: Diagnosis not present

## 2015-12-01 DIAGNOSIS — R1310 Dysphagia, unspecified: Secondary | ICD-10-CM

## 2015-12-01 DIAGNOSIS — K921 Melena: Secondary | ICD-10-CM

## 2015-12-01 DIAGNOSIS — K648 Other hemorrhoids: Secondary | ICD-10-CM

## 2015-12-01 NOTE — Progress Notes (Signed)
Referred by Dr. Garret Reddish Subjective:    Patient ID: John Costa, male    DOB: [birth year], 50 y.o.   MRN: [id number] Cc: heme + stool, hx melena HPI This middle-aged married man saw Dr. Yong Channel in Jan 2017 after a few days of jet black stools. Had heme + stool on DRE that day but NL Hgb. He has also been having years of  solid dysphagia - intermittent - will get relief by raising hands over head. Occ regurgitation. Has early satiety and constantly feels full. No weight loss. Has had 3-4 d of bad night sweats prior to this visit. Not sleeping well.  Anal discomfort and intermittent red blood w/ wiping, and has hx of hemorrhoids - using OTC Tx w/ some help. No prior upper or lower endoscopy.   Has had an episode of RUQ pain w/ nausea and vomiting - to EF - US showed gallstones and mildly thickened GB wall - to see Dr. Excell Seltzer in 2 d for consult. OK since except for above sxs. Also [place] supports fatty liver.  Wt Readings from Last 3 Encounters:  12/01/15 232 lb (105.235 kg)  11/19/15 234 lb (106.142 kg)  10/06/15 234 lb (106.142 kg)   Allergies  Allergen Reactions  . Cheese Anaphylaxis  . Vicodin [Hydrocodone-Acetaminophen] Other (See Comments)    vicodin gives him a headache   Outpatient Prescriptions Prior to Visit  Medication Sig Dispense Refill  . allopurinol (ZYLOPRIM) 100 MG tablet TAKE 1 TABLET EVERY DAY 30 tablet 5  . buPROPion (WELLBUTRIN XL) 150 MG 24 hr tablet TAKE 1 TABLET (150 MG TOTAL) BY MOUTH DAILY. 30 tablet 5  . clonazePAM (KLONOPIN) 0.5 MG tablet Take 1 tablet (0.5 mg total) by mouth 2 (two) times daily as needed for anxiety. 60 tablet 5  . colchicine (COLCRYS) 0.6 MG tablet TAKE 1 TABLET BY MOUTH TWICE DAILY 10 tablet 1  . diphenhydrAMINE (BENADRYL) 25 MG tablet Take 25 mg by mouth at bedtime as needed for sleep. Reported on 11/19/2015    . ibuprofen (ADVIL,MOTRIN) 200 MG tablet Take 800 mg by mouth every 6 (six) hours as needed for moderate pain. Reported on  11/19/2015    . phenylephrine (SUDAFED PE) 10 MG TABS tablet Take 10 mg by mouth daily. Reported on 11/19/2015    . ranitidine (ZANTAC) 75 MG tablet Take 75 mg by mouth daily.     . traMADol (ULTRAM) 50 MG tablet Take 1 tablet (50 mg total) by mouth every 8 (eight) hours as needed. For pain 60 tablet 3  . escitalopram (LEXAPRO) 10 MG tablet TAKE 1 TABLET EVERY DAY (Patient not taking: Reported on 11/14/2015) 30 tablet 2  . ondansetron (ZOFRAN ODT) 4 MG disintegrating tablet Take 1 tablet (4 mg total) by mouth every 8 (eight) hours as needed for nausea or vomiting. (Patient not taking: Reported on 11/19/2015) 10 tablet 0  . oxyCODONE-acetaminophen (PERCOCET/ROXICET) 5-325 MG tablet Take 1-2 tablets by mouth every 4 (four) hours as needed for moderate pain or severe pain. (Patient not taking: Reported on 11/19/2015) 15 tablet 0   No facility-administered medications prior to visit.   Past Medical History  Diagnosis Date  . Headache(784.0)     from cerebral aneurysm  2008  . GERD (gastroesophageal reflux disease)     occasional  . Anxiety   . Pneumonia     2010  . Depression     no treatment  . Allergy   . CHOLELITHIASIS 06/12/2007    Qualifier: Diagnosis  of  By: Scherrie Gerlach    . PONV (postoperative nausea and vomiting)   . Nephrolithiasis 09/19/2010  . Cerebral aneurysm   . Gout   . Gilbert's syndrome   . Pelvic kidney   . Migraines   . Anal fissure   . Anemia   . HTN (hypertension)    Past Surgical History  Procedure Laterality Date  . Knee arthroscopies Bilateral     has had this several times  . Stone extraction with basket  12/30/10  . Cataract extraction      cataracts with lens implants  . Arterial aneurysm repair  2008  . Cystoscopy with retrograde pyelogram, ureteroscopy and stent placement Left 08/24/2015    Procedure: CYSTOSCOPY WITHLEFT RETROGRADE PYELOGRAM, URETEROSCOPY AND STENT PLACEMENT;  Surgeon: Raynelle Bring, MD;  Location: [facility];  Service: Urology;  Laterality:  Left;  . Holmium laser application Left [id number]    Procedure:  WITH HOLMIUM LASER LITHOTRIPSY;  Surgeon: Raynelle Bring, MD;  Location: [facility];  Service: Urology;  Laterality: Left;   Social History   Social History  . Marital Status: Married    Spouse Name: N/A  . Number of Children: 3  . Years of Education: N/A   Occupational History  . Probation officer    Social History Main Topics  . Smoking status: Never Smoker   . Smokeless tobacco: Never Used  . Alcohol Use: 1.2 - 1.8 oz/week    2-3 Standard drinks or equivalent per week     Comment: occasional  . Drug Use: Yes    Special: Marijuana     Comment: 1990s, Has done it in legal states such as [place] since that time  . Sexual Activity: Not Asked   Other Topics Concern  . None   Social History Narrative   Married (wife patient elsewhere). 3 children.       Works as Optician, dispensing: play golf, reading, walking, swimming   Family History  Problem Relation Age of Onset  . Prostate cancer Father 26  . Diabetes Brother   . Hemochromatosis Brother   . Colon cancer Neg Hx   . Diabetes Sister   . Hemachromatosis Sister      Review of Systems As above , also fatigue and some anxiety - has noticed increased pulse All other ROS negative    Objective:   Physical Exam @BP  110/70 mmHg  Pulse 104  Ht 5\' 9"  (1.753 m)  Wt 232 lb (105.235 kg)  BMI 34.24 kg/m2@  General:  Well-developed, well-nourished and in no acute distress Eyes:  anicteric. ENT:   Mouth and posterior pharynx free of lesions.  Neck:   supple w/o thyromegaly or mass.  Lungs: Clear to auscultation bilaterally. Heart:  S1S2, no rubs, murmurs, gallops. Abdomen:  soft, non-tender, no hepatosplenomegaly, hernia, or mass and BS+.  Rectal: Small anal tags - no mass - anoscopy Gr1-2 inflamed internal hemrrhoids  Lymph:  no cervical or supraclavicular adenopathy. Extremities:   no edema, cyanosis or clubbing Skin   blotchy patches that blanch on  back Neuro:  A&O x 3.  Psych:  appropriate mood and  Affect.   Data Reviewed:  PCP notes CBC Latest Ref Rng 11/14/2015 10/06/2015 08/29/2015  WBC 4.0 - 10.5 K/uL 10.8(H) 9.7 11.6(H)  Hemoglobin 13.0 - 17.0 g/dL 15.0 14.3 14.5  Hematocrit 39.0 - 52.0 % 42.7 42.6 42.0  Platelets 150 - 400 K/uL 331 229.0 222   BUN was NL  [place] as  above - 10/2015 - images viewed     Assessment & Plan:  Dysphagia  Melena  Early satiety Night sweats Hemorrhoids, internal, with bleeding  Bulk of sxs suggest upper GI problem - ? Achalasia, ? Other dysmotility - esophageal or gastric or ? Neoplasm - night sweats raise ? Lymphoma...will start w/ EGD and possible dilation - The risks and benefits as well as alternatives of endoscopic procedure(s) have been discussed and reviewed. All questions answered. The patient agrees to proceed.  Depending upon results may not need [facility] consult though likely reasonable to have a cholecystectomy unless convincing findings opposing by EGD  Will address hemorrhoids after EGD - if EGD unremarkable a diagnostic colonoscopy likely.  [id number] Yong Channel, MD Adonis Housekeeper, MD

## 2015-12-01 NOTE — Patient Instructions (Signed)

## 2015-12-02 ENCOUNTER — Ambulatory Visit (AMBULATORY_SURGERY_CENTER): Payer: BC Managed Care – PPO | Admitting: Internal Medicine

## 2015-12-02 ENCOUNTER — Encounter: Payer: Self-pay | Admitting: Internal Medicine

## 2015-12-02 VITALS — BP 127/87 | HR 87 | Temp 97.5°F | Resp 18 | Ht 69.0 in | Wt 232.0 lb

## 2015-12-02 DIAGNOSIS — R1310 Dysphagia, unspecified: Secondary | ICD-10-CM | POA: Diagnosis not present

## 2015-12-02 DIAGNOSIS — K921 Melena: Secondary | ICD-10-CM

## 2015-12-02 DIAGNOSIS — Q394 Esophageal web: Secondary | ICD-10-CM | POA: Diagnosis not present

## 2015-12-02 DIAGNOSIS — K222 Esophageal obstruction: Secondary | ICD-10-CM

## 2015-12-02 MED ORDER — SODIUM CHLORIDE 0.9 % IV SOLN: 500.0000 mL | INTRAVENOUS | Status: DC

## 2015-12-02 MED ORDER — PANTOPRAZOLE SODIUM 40 MG PO TBEC: 40.0000 mg | DELAYED_RELEASE_TABLET | Freq: Every day | ORAL | Status: DC

## 2015-12-02 NOTE — Progress Notes (Signed)
Called to room to assist during endoscopic procedure.  Patient ID and intended procedure confirmed with present staff. Received instructions for my participation in the procedure from the performing physician.  

## 2015-12-02 NOTE — Patient Instructions (Addendum)
   I found a ring - or rim of scar tissue that could have been causing the swallowing problems. I opened it up. Let's see what that does. This is often from reflux so I am going to start a new acid blocker - pantoprazole. Stop Ranidtine  You will need to be scheduled for a colonoscopy next - my staff will do that.  I think you should keep your appointment with the surgeon about the gallbladder.  I appreciate the opportunity to care for you. Gatha Mayer, MD, FACG YOU HAD AN ENDOSCOPIC PROCEDURE TODAY AT [facility]:   Refer to the procedure report that was given to you for any specific questions about what was found during the examination.  If the procedure report does not answer your questions, please call your gastroenterologist to clarify.  If you requested that your care partner not be given the details of your procedure findings, then the procedure report has been included in a sealed envelope for you to review at your convenience later  Please Note:  You might notice some irritation and congestion in your nose or some drainage.  This is from the oxygen used during your procedure.  There is no need for concern and it should clear up in a day or so.  SYMPTOMS TO REPORT IMMEDIATELY:    Following upper endoscopy (EGD)  Vomiting of blood or coffee ground material  New chest pain or pain under the shoulder blades  Painful or persistently difficult swallowing  New shortness of breath  Fever of 100F or higher  Black, tarry-looking stools  For urgent or emergent issues, a gastroenterologist can be reached at any hour by calling [phone].   DIET: Follow Dilation diet   ACTIVITY:  You should plan to take it easy for the rest of today and you should NOT DRIVE or use heavy machinery until tomorrow (because of the sedation medicines used during the test).    FOLLOW UP: Our staff will call the number listed on your records the next business day following your  procedure to check on you and address any questions or concerns that you may have regarding the information given to you following your procedure. If we do not reach you, we will leave a message.  However, if you are feeling well and you are not experiencing any problems, there is no need to return our call.  We will assume that you have returned to your regular daily activities without incident.  If any biopsies were taken you will be contacted by phone or by letter within the next 1-3 weeks.  Please call us at [phone] if you have not heard about the biopsies in 3 weeks.    SIGNATURES/CONFIDENTIALITY: You and/or your care partner have signed paperwork which will be entered into your electronic medical record.  These signatures attest to the fact that that the information above on your After Visit Summary has been reviewed and is understood.  Full responsibility of the confidentiality of this discharge information lies with you and/or your care-partner.  Follow dilation diet Resume medications today Start taking Protonix 40 mg daily

## 2015-12-02 NOTE — Op Note (Signed)
[facility] Patient Name: John Costa Procedure Date: 12/02/2015 10:20 AM MRN: [id number] Endoscopist: Gatha Mayer , MD Age: 50 Referring MD:  Date of Birth: [birth year] Gender: Male Procedure:                Upper GI endoscopy Indications:              Dysphagia, Heartburn, Suspected esophageal reflux Medicines:                Propofol total dose 330 mg IV, Monitored Anesthesia                            Care Procedure:                Pre-Anesthesia Assessment:                           - Prior to the procedure, a History and Physical                            was performed, and patient medications and                            allergies were reviewed. The patient's tolerance of                            previous anesthesia was also reviewed. The risks                            and benefits of the procedure and the sedation                            options and risks were discussed with the patient.                            All questions were answered, and informed consent                            was obtained. Prior Anticoagulants: The patient has                            taken no previous anticoagulant or antiplatelet                            agents. ASA Grade Assessment: II - A patient with                            mild systemic disease. After reviewing the risks                            and benefits, the patient was deemed in                            satisfactory condition to undergo the procedure.  After obtaining informed consent, the endoscope was                            passed under direct vision. Throughout the                            procedure, the patient's blood pressure, pulse, and                            oxygen saturations were monitored continuously. The                            Model GIF-HQ190 [id number] [id number]) scope was introduced                            through the mouth, and advanced to the  second part                            of duodenum. The upper GI endoscopy was                            accomplished without difficulty. The patient                            tolerated the procedure well. Scope In: Scope Out: Findings:      A mild Schatzki ring (acquired) was found at the gastroesophageal       junction. The scope was withdrawn. Dilation was performed with a Maloney       dilator with no resistance at 35 Fr. The dilation site was examined       following endoscope reinsertion and showed no change. Estimated blood       loss: none. A TTS dilator was passed through the scope. Dilation with an       18-19-20 mm balloon dilator was performed to 20 mm. The dilation site       was examined following endoscope reinsertion and showed no change.       Estimated blood loss: none. This was biopsied with a cold forceps for       disruption of the ring, no specimens sent. It was effective. No biopsies       or other specimens were collected for this exam.      A 2 cm hiatal hernia was present.      The exam was otherwise without abnormality.      The cardia and gastric fundus were normal on retroflexion. Complications:            No immediate complications. Estimated Blood Loss:     Estimated blood loss: none. Estimated blood loss:                            none. Impression:               - No specimens collected. Recommendation:           - Patient has a contact number available for  emergencies. The signs and symptoms of potential                            delayed complications were discussed with the                            patient. Return to normal activities tomorrow.                            Written discharge instructions were provided to the                            patient.                           - Clear liquids x 1 hour then soft foods rest of                            day. Start prior diet tomorrow.                           -  Continue present medications.                           - Use Protonix (pantoprazole) 40 mg PO daily.                           - Perform a colonoscopy at the next available                            appointment.                           - Colonoscopy to evaluate melena as no cause seen                            here. Procedure Code(s):        --- Professional ---                           [phone], Esophagogastroduodenoscopy, flexible,                            transoral; with transendoscopic balloon dilation of                            esophagus (less than 30 mm diameter)                           [id number], Esophagogastroduodenoscopy, flexible,                            transoral; with biopsy, single or multiple CPT copyright 2016 American Medical Association. All rights reserved. Gatha Mayer, MD Gatha Mayer, MD 12/02/2015 10:57:36 AM Number of Addenda: 0

## 2015-12-02 NOTE — Progress Notes (Signed)
Report to PACU, RN, vss, BBS= Clear.  

## 2015-12-03 ENCOUNTER — Telehealth: Payer: Self-pay | Admitting: Emergency Medicine

## 2015-12-03 NOTE — Telephone Encounter (Signed)
  Follow up Call-  Call back number 12/02/2015  Post procedure Call Back phone  # [phone]  Permission to leave phone message Yes     Patient questions:  Do you have a fever, pain , or abdominal swelling? No. Pain Score  0 *  Have you tolerated food without any problems? Yes.    Have you been able to return to your normal activities? Yes.    Do you have any questions about your discharge instructions: Diet   No. Medications  No. Follow up visit  No.  Do you have questions or concerns about your Care? No.  Actions: * If pain score is 4 or above: No action needed, pain <4.

## 2015-12-08 ENCOUNTER — Ambulatory Visit (AMBULATORY_SURGERY_CENTER): Payer: Self-pay | Admitting: *Deleted

## 2015-12-08 VITALS — Ht 70.0 in | Wt 227.0 lb

## 2015-12-08 DIAGNOSIS — K921 Melena: Secondary | ICD-10-CM

## 2015-12-08 NOTE — Progress Notes (Signed)
No egg or soy allergy known to patient   issues with past sedation with any surgeries  or procedures, no intubation problems but with general sedation has post op N/V  No diet pills per patient No home 02 use per patient  No blood thinners per patient  Pt denies issues with constipation   emmi declined

## 2015-12-11 ENCOUNTER — Other Ambulatory Visit: Payer: Self-pay | Admitting: Family Medicine

## 2015-12-17 ENCOUNTER — Ambulatory Visit (AMBULATORY_SURGERY_CENTER): Payer: BC Managed Care – PPO | Admitting: Internal Medicine

## 2015-12-17 ENCOUNTER — Encounter: Payer: Self-pay | Admitting: Internal Medicine

## 2015-12-17 VITALS — BP 138/81 | HR 70 | Temp 97.1°F | Resp 12 | Ht 70.0 in | Wt 227.0 lb

## 2015-12-17 DIAGNOSIS — D125 Benign neoplasm of sigmoid colon: Secondary | ICD-10-CM

## 2015-12-17 DIAGNOSIS — K921 Melena: Secondary | ICD-10-CM

## 2015-12-17 MED ORDER — SODIUM CHLORIDE 0.9 % IV SOLN: 500.0000 mL | INTRAVENOUS | Status: DC

## 2015-12-17 NOTE — Progress Notes (Signed)
Called to room to assist during endoscopic procedure.  Patient ID and intended procedure confirmed with present staff. Received instructions for my participation in the procedure from the performing physician.  

## 2015-12-17 NOTE — Patient Instructions (Addendum)
I found and removed one polyp - it was not the cause of dark stools. It may have been causing the rectal bleeding though. If that persists let me know and we can address the hemorrhoids.  You also have a condition called diverticulosis - common and not usually a problem. Please read the handout provided.  I will let you know pathology results and when to have another routine colonoscopy by mail.  I appreciate the opportunity to care for you. Gatha Mayer, MD, FACG  YOU HAD AN ENDOSCOPIC PROCEDURE TODAY AT [facility]:   Refer to the procedure report that was given to you for any specific questions about what was found during the examination.  If the procedure report does not answer your questions, please call your gastroenterologist to clarify.  If you requested that your care partner not be given the details of your procedure findings, then the procedure report has been included in a sealed envelope for you to review at your convenience later.  YOU SHOULD EXPECT: Some feelings of bloating in the abdomen. Passage of more gas than usual.  Walking can help get rid of the air that was put into your GI tract during the procedure and reduce the bloating. If you had a lower endoscopy (such as a colonoscopy or flexible sigmoidoscopy) you may notice spotting of blood in your stool or on the toilet paper. If you underwent a bowel prep for your procedure, you may not have a normal bowel movement for a few days.  Please Note:  You might notice some irritation and congestion in your nose or some drainage.  This is from the oxygen used during your procedure.  There is no need for concern and it should clear up in a day or so.  SYMPTOMS TO REPORT IMMEDIATELY:   Following lower endoscopy (colonoscopy or flexible sigmoidoscopy):  Excessive amounts of blood in the stool  Significant tenderness or worsening of abdominal pains  Swelling of the abdomen that is new, acute  Fever of 100F  or higher  For urgent or emergent issues, a gastroenterologist can be reached at any hour by calling [phone].   DIET: Your first meal following the procedure should be a small meal and then it is ok to progress to your normal diet. Heavy or fried foods are harder to digest and may make you feel nauseous or bloated.  Likewise, meals heavy in dairy and vegetables can increase bloating.  Drink plenty of fluids but you should avoid alcoholic beverages for 24 hours.  ACTIVITY:  You should plan to take it easy for the rest of today and you should NOT DRIVE or use heavy machinery until tomorrow (because of the sedation medicines used during the test).    FOLLOW UP: Our staff will call the number listed on your records the next business day following your procedure to check on you and address any questions or concerns that you may have regarding the information given to you following your procedure. If we do not reach you, we will leave a message.  However, if you are feeling well and you are not experiencing any problems, there is no need to return our call.  We will assume that you have returned to your regular daily activities without incident.  If any biopsies were taken you will be contacted by phone or by letter within the next 1-3 weeks.  Please call us at [phone] if you have not heard about the biopsies in  3 weeks.    SIGNATURES/CONFIDENTIALITY: You and/or your care partner have signed paperwork which will be entered into your electronic medical record.  These signatures attest to the fact that that the information above on your After Visit Summary has been reviewed and is understood.  Full responsibility of the confidentiality of this discharge information lies with you and/or your care-partner.  Hemorrhoid handout provided to patient/care partner. Avoid NSAID'S (aspirin, aspirin products, and anti-inflammatory drugs) for 7 days.

## 2015-12-17 NOTE — Op Note (Signed)
[facility] Patient Name: John Costa Procedure Date: 12/17/2015 11:24 AM MRN: [id number] Endoscopist: Gatha Mayer , MD Age: 50 Referring MD:  Date of Birth: [birth year] Gender: Male Procedure:                Colonoscopy Indications:              Evaluation of unexplained GI bleeding Medicines:                Propofol total dose 250 mg IV, Monitored Anesthesia                            Care Procedure:                Pre-Anesthesia Assessment:                           - Prior to the procedure, a History and Physical                            was performed, and patient medications and                            allergies were reviewed. The patient's tolerance of                            previous anesthesia was also reviewed. The risks                            and benefits of the procedure and the sedation                            options and risks were discussed with the patient.                            All questions were answered, and informed consent                            was obtained. Prior Anticoagulants: The patient has                            taken no previous anticoagulant or antiplatelet                            agents. ASA Grade Assessment: II - A patient with                            mild systemic disease. After reviewing the risks                            and benefits, the patient was deemed in                            satisfactory condition to undergo the procedure.  After obtaining informed consent, the colonoscope                            was passed under direct vision. Throughout the                            procedure, the patient's blood pressure, pulse, and                            oxygen saturations were monitored continuously. The                            Model CF-HQ190L [id number] [id number]) scope was introduced                            through the anus and advanced to the the cecum,                  identified by appendiceal orifice and ileocecal                            valve. The colonoscopy was performed without                            difficulty. The patient tolerated the procedure                            well. The quality of the bowel preparation was                            excellent. The bowel preparation used was Miralax.                            The terminal ileum, ileocecal valve, appendiceal                            orifice, and rectum were photographed. Scope In: 11:31:50 AM Scope Out: 11:47:16 AM Scope Withdrawal Time: 0 hours 13 minutes 35 seconds  Total Procedure Duration: 0 hours 15 minutes 26 seconds  Findings:      The perianal and digital rectal examinations were normal. Pertinent       negatives include normal prostate (size, shape, and consistency).      A 8 mm polyp was found in the distal sigmoid colon. The polyp was       semi-pedunculated. The polyp was removed with a hot snare. Resection and       retrieval were complete. Verification of patient identification for the       specimen was done. Estimated blood loss: none.      Diverticula were found in the sigmoid colon.      The terminal ileum appeared normal.      The exam was otherwise without abnormality on direct and retroflexion       views. Complications:            No immediate complications. Estimated Blood Loss:     Estimated blood loss: none. Impression:               -  One 8 mm polyp in the distal sigmoid colon,                            removed with a hot snare. Resected and retrieved.                           - Mild diverticulosis in the sigmoid colon.                           - The examined portion of the ileum was normal.                           - The examination was otherwise normal on direct                            and retroflexion views. NO CAUSE OF BRIEF EPISODE                            OF MELENA FOUND - EGD WAS NEGATIVE ALSO SO NO                             FURTHER EVALUATION Recommendation:           - Written discharge instructions were provided to                            the patient.                           - Patient has a contact number available for                            emergencies. The signs and symptoms of potential                            delayed complications were discussed with the                            patient. Return to normal activities tomorrow.                            Written discharge instructions were provided to the                            patient.                           - Resume previous diet.                           - Continue present medications.                           - No aspirin, ibuprofen, naproxen, or other  non-steroidal anti-inflammatory drugs for 7 days                            after polyp removal.                           - Repeat colonoscopy is recommended. The                            colonoscopy date will be determined after pathology                            results from today's exam become available for                            review. Procedure Code(s):        --- Professional ---                           [phone], Colonoscopy, flexible; with removal of                            tumor(s), polyp(s), or other lesion(s) by snare                            technique CPT copyright 2016 [employer]. All rights reserved. Gatha Mayer, MD 12/17/2015 11:59:26 AM This report has been signed electronically. Number of Addenda: 0 CC Letter to:             Dr. Brown Human Referring MD:      John Costa

## 2015-12-17 NOTE — Progress Notes (Signed)
A and Ox 3 Report to RN 

## 2015-12-18 ENCOUNTER — Telehealth: Payer: Self-pay

## 2015-12-18 NOTE — Telephone Encounter (Signed)
  Follow up Call-  Call back number 12/17/2015 12/02/2015  Post procedure Call Back phone  # [phone]  Permission to leave phone message Yes Yes    Patient was called for follow up after procedure on 12/17/2015. No answer at the number given for follow up phone call. A message was left on the answering machine.

## 2015-12-24 NOTE — Progress Notes (Signed)
Quick Note:  8 mm adenoma - recall colon 2022  No letter needed - My Chart notification used ______

## 2015-12-25 ENCOUNTER — Encounter: Payer: Self-pay | Admitting: Family Medicine

## 2015-12-25 DIAGNOSIS — Z860101 Personal history of adenomatous and serrated colon polyps: Secondary | ICD-10-CM | POA: Insufficient documentation

## 2015-12-25 DIAGNOSIS — Z86010 Personal history of colonic polyps: Secondary | ICD-10-CM | POA: Insufficient documentation

## 2016-03-08 ENCOUNTER — Other Ambulatory Visit: Payer: Self-pay | Admitting: Family Medicine

## 2016-03-30 ENCOUNTER — Other Ambulatory Visit: Payer: Self-pay

## 2016-03-30 DIAGNOSIS — K222 Esophageal obstruction: Secondary | ICD-10-CM

## 2016-03-30 MED ORDER — PANTOPRAZOLE SODIUM 40 MG PO TBEC: 40.0000 mg | DELAYED_RELEASE_TABLET | Freq: Every day | ORAL | Status: DC

## 2016-06-16 ENCOUNTER — Other Ambulatory Visit: Payer: Self-pay

## 2016-06-16 MED ORDER — ALLOPURINOL 100 MG PO TABS: 100.0000 mg | ORAL_TABLET | Freq: Every day | ORAL | 1 refills | Status: DC

## 2016-11-25 ENCOUNTER — Encounter: Payer: Self-pay | Admitting: Family Medicine

## 2016-11-25 ENCOUNTER — Ambulatory Visit (INDEPENDENT_AMBULATORY_CARE_PROVIDER_SITE_OTHER): Payer: BC Managed Care – PPO | Admitting: Family Medicine

## 2016-11-25 VITALS — BP 118/88 | HR 120 | Temp 99.1°F | Ht 70.0 in | Wt 211.0 lb

## 2016-11-25 DIAGNOSIS — R509 Fever, unspecified: Secondary | ICD-10-CM | POA: Diagnosis not present

## 2016-11-25 DIAGNOSIS — J111 Influenza due to unidentified influenza virus with other respiratory manifestations: Secondary | ICD-10-CM

## 2016-11-25 LAB — POCT INFLUENZA A/B
Influenza A, POC: NEGATIVE
Influenza B, POC: NEGATIVE

## 2016-11-25 MED ORDER — GUAIFENESIN-CODEINE 100-10 MG/5ML PO SOLN: 5.0000 mL | Freq: Four times a day (QID) | ORAL | 0 refills | Status: DC | PRN

## 2016-11-25 NOTE — Progress Notes (Signed)
PCP: Garret Reddish, MD  Subjective:  John Costa is a 51 y.o. year old very pleasant male patient who presents with flu like symptoms including fever,body aches, chills. Came on suddenly yesterday afternoon -other symptoms: severe fatigue -inside 48 hour treatment window if needed for tamiflu: yes -high risk condition (children <5, adults >65, chronic pulmonary or cardiac condition, immunosuppression, pregnancy, nursing home resident, morbid obesity) : no -symptoms show no change -previous treatments: dextromethorphan but still cannot get any sleep - patient did receive flu shot this year.  - endorses sick contact; specifically influenza: unknown. Daughter and son  ROS-denies SOB, NVD, sinus or dental pain  Pertinent Past Medical History-  Patient Active Problem List   Diagnosis Date Noted  . Aneurysm, carotid artery, internal 02/18/2011    Priority: High  . Panic attacks 11/26/2014    Priority: Medium  . Depression 03/14/2011    Priority: Medium  . Gout 06/12/2007    Priority: Medium  . Allergic rhinitis 11/26/2014    Priority: Low  . Skin lesion 11/26/2014    Priority: Low  . Nephrolithiasis 09/19/2010    Priority: Low  . Pityriasis rosea 01/21/2008    Priority: Low  . Gilbert's disease 06/12/2007    Priority: Low  . History of adenomatous polyp of colon 12/25/2015    Medications- reviewed  Current Outpatient Prescriptions  Medication Sig Dispense Refill  . allopurinol (ZYLOPRIM) 100 MG tablet Take 1 tablet (100 mg total) by mouth daily. 90 tablet 1  . Calcium Carbonate (CALTRATE 600 PO) Take 1 tablet by mouth daily.    . clonazePAM (KLONOPIN) 0.5 MG tablet Take 1 tablet (0.5 mg total) by mouth 2 (two) times daily as needed for anxiety. 60 tablet 5  . colchicine (COLCRYS) 0.6 MG tablet TAKE 1 TABLET BY MOUTH TWICE DAILY 10 tablet 1  . ibuprofen (ADVIL,MOTRIN) 200 MG tablet Take 800 mg by mouth every 6 (six) hours as needed for moderate pain. Reported on  11/19/2015    . traMADol (ULTRAM) 50 MG tablet Take 1 tablet (50 mg total) by mouth every 8 (eight) hours as needed. For pain 60 tablet 3   No current facility-administered medications for this visit.     Objective: BP 118/88 (BP Location: Left Arm, Patient Position: Sitting, Cuff Size: Large)   Pulse (!) 120   Temp 99.1 F (37.3 C) (Oral)   Ht 5\' 10"  (1.778 m)   Wt 211 lb (95.7 kg)   SpO2 97%   BMI 30.28 kg/m  Gen: NAD, appears fatigued HEENT: Turbinates erythematous, TM normal, pharynx mildly erythematous with no tonsilar exudate or edema, no sinus tenderness CV: RRR no murmurs rubs or gallops Lungs: CTAB no crackles, wheeze, rhonchi Abdomen: soft/nontender/nondistended/normal bowel sounds. Ext: no edema Skin: warm, dry, no rash  Results for orders placed or performed in visit on 11/25/16 (from the past 24 hour(s))  POCT Influenza A/B     Status: None   Collection Time: 11/25/16  3:18 PM  Result Value Ref Range   Influenza A, POC Negative Negative   Influenza B, POC Negative Negative   Assessment/Plan:  Flu-like illness: even with negative flu still suspect this is likely the flu History and exam today are suggestive of viral process. Patients influenza test was negative.  Pretest probability of influenza was high. High risk condition: no  Patient will not be treated with Tamiflu  Symptomatic treatment with: rest, hydration, could schedule tylenol for the body aches   Finally, we reviewed reasons to return  to care including if symptoms worsen or persist or new concerns arise. There are still risks for pneumonia and sepsis even in healthy adults.   Meds ordered this encounter  Medications  . guaiFENesin-codeine 100-10 MG/5ML syrup    Sig: Take 5 mLs by mouth every 6 (six) hours as needed for cough.    Dispense:  120 mL    Refill:  0  advised no driving for 8 hours after taking  Garret Reddish, MD

## 2016-11-25 NOTE — Progress Notes (Signed)
Pre visit review using our clinic review tool, if applicable. No additional management support is needed unless otherwise documented below in the visit note. 

## 2016-11-25 NOTE — Patient Instructions (Signed)
Flu-like illness: even with negative flu still suspect this is likely the flu History and exam today are suggestive of viral process. Patients influenza test was negative.  Pretest probability of influenza was high. High risk condition: no  Patient will not be treated with Tamiflu  Symptomatic treatment with: rest, hydration, could schedule tylenol for the body aches   Finally, we reviewed reasons to return to care including if symptoms worsen or persist or new concerns arise. There are still risks for pneumonia and sepsis even in healthy adults

## 2016-11-29 ENCOUNTER — Encounter: Payer: Self-pay | Admitting: Family Medicine

## 2016-11-29 ENCOUNTER — Ambulatory Visit (INDEPENDENT_AMBULATORY_CARE_PROVIDER_SITE_OTHER)
Admission: RE | Admit: 2016-11-29 | Discharge: 2016-11-29 | Disposition: A | Discharge status: Home / Self Care | Payer: BC Managed Care – PPO | Source: Ambulatory Visit | Attending: Family Medicine | Admitting: Family Medicine

## 2016-11-29 ENCOUNTER — Ambulatory Visit (INDEPENDENT_AMBULATORY_CARE_PROVIDER_SITE_OTHER): Payer: BC Managed Care – PPO | Admitting: Family Medicine

## 2016-11-29 VITALS — BP 140/98 | HR 84 | Temp 98.4°F | Wt 209.8 lb

## 2016-11-29 DIAGNOSIS — R059 Cough, unspecified: Secondary | ICD-10-CM

## 2016-11-29 DIAGNOSIS — R6889 Other general symptoms and signs: Secondary | ICD-10-CM

## 2016-11-29 DIAGNOSIS — R062 Wheezing: Secondary | ICD-10-CM

## 2016-11-29 DIAGNOSIS — R05 Cough: Secondary | ICD-10-CM

## 2016-11-29 MED ORDER — ALBUTEROL SULFATE (2.5 MG/3ML) 0.083% IN NEBU
2.5000 mg | INHALATION_SOLUTION | Freq: Once | RESPIRATORY_TRACT | Status: AC
Start: 2016-11-29 — End: 2016-11-29
  Administered 2016-11-29: 2.5 mg via RESPIRATORY_TRACT

## 2016-11-29 MED ORDER — PREDNISONE 20 MG PO TABS: ORAL_TABLET | ORAL | 0 refills | Status: DC

## 2016-11-29 MED ORDER — ALBUTEROL SULFATE HFA 108 (90 BASE) MCG/ACT IN AERS: 2.0000 | INHALATION_SPRAY | RESPIRATORY_TRACT | 0 refills | Status: DC | PRN

## 2016-11-29 NOTE — Progress Notes (Signed)
Pre visit review using our clinic review tool, if applicable. No additional management support is needed unless otherwise documented below in the visit note. 

## 2016-11-29 NOTE — Progress Notes (Signed)
Subjective:     Patient ID: John Costa, male   DOB: Jun 24, 1966, 51 y.o.   MRN: [id number]  HPI Patient seen for acute visit with increased cough and wheeze and some shortness of breath. He had acute onset last Thursday of flulike illness. He was seen here Friday and influenza screen was negative but it was felt he probably still had influenza. He declined Tamiflu and also is low risk. He had previous nausea with Tamiflu previously. He was prescribed guaifenesin with codeine cough syrup which has helped his cough at night. Overall, he has felt somewhat better with less body aches but has continued to have some chills and sweats over the weekend. He had productive cough over the weekend. He noticed some increased difficulties with exhaling over the past day. No history of asthma.  No hemoptysis. No pleuritic pain. No chest pains. Nonsmoker.  Past Medical History:  Diagnosis Date  . Allergy   . Anal fissure   . Anemia   . Anxiety   . Cataract    removed bilateral  . Cerebral aneurysm   . CHOLELITHIASIS 06/12/2007   Qualifier: Diagnosis of  By: Scherrie Gerlach    . Depression    no treatment  . GERD (gastroesophageal reflux disease)    occasional  . Gilbert's syndrome   . Gout   . Headache(784.0)    from cerebral aneurysm  2008  . HTN (hypertension)   . Migraines   . Nephrolithiasis 09/19/2010  . Pelvic kidney   . Pneumonia    2010  . PONV (postoperative nausea and vomiting)    Past Surgical History:  Procedure Laterality Date  . ARTERIAL ANEURYSM REPAIR  2008  . CATARACT EXTRACTION     cataracts with lens implants  . CYSTOSCOPY WITH RETROGRADE PYELOGRAM, URETEROSCOPY AND STENT PLACEMENT Left 08/24/2015   Procedure: CYSTOSCOPY WITHLEFT RETROGRADE PYELOGRAM, URETEROSCOPY AND STENT PLACEMENT;  Surgeon: Raynelle Bring, MD;  Location: [facility];  Service: Urology;  Laterality: Left;  . HOLMIUM LASER APPLICATION Left 02/19/6947   Procedure:  WITH HOLMIUM LASER LITHOTRIPSY;  Surgeon:  Raynelle Bring, MD;  Location: [facility];  Service: Urology;  Laterality: Left;  . knee arthroscopies Bilateral    has had this several times  . STONE EXTRACTION WITH BASKET  12/30/10  . UPPER GASTROINTESTINAL ENDOSCOPY      reports that he has never smoked. He has never used smokeless tobacco. He reports that he drinks about 1.2 - 1.8 oz of alcohol per week . He reports that he uses drugs, including Marijuana. family history includes Diabetes in his brother and sister; Hemachromatosis in his sister; Hemochromatosis in his brother; Prostate cancer (age of onset: 47) in his father. Allergies  Allergen Reactions  . Cheese Anaphylaxis  . Vicodin [Hydrocodone-Acetaminophen] Other (See Comments)    vicodin gives him a headache     Review of Systems  Constitutional: Positive for chills and fatigue.  HENT: Positive for congestion. Negative for sore throat.   Respiratory: Positive for cough and wheezing.   Cardiovascular: Negative for chest pain, palpitations and leg swelling.  Gastrointestinal: Negative for nausea and vomiting.  Neurological: Negative for syncope and weakness.       Objective:   Physical Exam  Constitutional: He appears well-developed and well-nourished.  Patient frequently coughing during exam but in no respiratory distress  HENT:  Right Ear: External ear normal.  Left Ear: External ear normal.  Mouth/Throat: Oropharynx is clear and moist.  Neck: Neck supple.  Cardiovascular: Normal rate  and regular rhythm.   Pulmonary/Chest:  Patient has some diffuse expiratory wheezes. No respiratory distress. Pulse oximetry 96%.  Musculoskeletal: He exhibits no edema.  Lymphadenopathy:    He has no cervical adenopathy.  Neurological: He is alert.  Skin: No rash noted.       Assessment:     Patient presents with recent acute respiratory illness (likely influenza) and now presents with reactive airway component with increased cough but no respiratory distress    Plan:      -Nebulizer given with albuterol-mild improvement in wheezing -Albuterol MDI 2 puffs every 4 hours as need for cough and wheeze -Prednisone 20 mg 2 tablets once daily for 5 days -PA and lateral chest x-ray to rule out any pneumonia -No antibiotics unless indications from chest x-ray above -Follow-up immediately for any worsening symptoms  Eulas Post MD [facility]

## 2016-12-22 ENCOUNTER — Other Ambulatory Visit: Payer: Self-pay | Admitting: Family Medicine

## 2017-05-08 ENCOUNTER — Encounter: Payer: Self-pay | Admitting: Family Medicine

## 2017-05-16 ENCOUNTER — Encounter: Payer: Self-pay | Admitting: Family Medicine

## 2017-05-16 ENCOUNTER — Telehealth: Payer: Self-pay

## 2017-05-16 ENCOUNTER — Ambulatory Visit (INDEPENDENT_AMBULATORY_CARE_PROVIDER_SITE_OTHER): Payer: BC Managed Care – PPO | Admitting: Family Medicine

## 2017-05-16 VITALS — BP 138/102 | HR 88 | Temp 98.3°F | Wt 206.0 lb

## 2017-05-16 DIAGNOSIS — F410 Panic disorder [episodic paroxysmal anxiety] without agoraphobia: Secondary | ICD-10-CM | POA: Diagnosis not present

## 2017-05-16 DIAGNOSIS — R634 Abnormal weight loss: Secondary | ICD-10-CM | POA: Diagnosis not present

## 2017-05-16 DIAGNOSIS — I10 Essential (primary) hypertension: Secondary | ICD-10-CM | POA: Diagnosis not present

## 2017-05-16 DIAGNOSIS — R6889 Other general symptoms and signs: Secondary | ICD-10-CM | POA: Diagnosis not present

## 2017-05-16 DIAGNOSIS — R252 Cramp and spasm: Secondary | ICD-10-CM

## 2017-05-16 LAB — BASIC METABOLIC PANEL
BUN: 21 mg/dL (ref 6–23)
CO2: 31 mEq/L (ref 19–32)
Calcium: 10.1 mg/dL (ref 8.4–10.5)
Chloride: 101 mEq/L (ref 96–112)
Creatinine, Ser: 1.05 mg/dL (ref 0.40–1.50)
GFR: 79.28 mL/min (ref >60.00)
Glucose, Bld: 106 mg/dL — ABNORMAL HIGH (ref 70–99)
Potassium: 4.5 mEq/L (ref 3.5–5.1)
Sodium: 139 mEq/L (ref 135–145)

## 2017-05-16 LAB — POC URINALSYSI DIPSTICK (AUTOMATED)
Bilirubin, UA: NEGATIVE
Blood, UA: NEGATIVE
Clarity, UA: NEGATIVE
Glucose, UA: NEGATIVE
Ketones, UA: NEGATIVE
Leukocytes, UA: NEGATIVE
Nitrite, UA: NEGATIVE
Spec Grav, UA: 1.025 (ref 1.010–1.025)
Urobilinogen, UA: 0.2 E.U./dL
pH, UA: 6 (ref 5.0–8.0)

## 2017-05-16 LAB — TSH: TSH: 1.47 u[IU]/mL (ref 0.35–4.50)

## 2017-05-16 MED ORDER — CLONAZEPAM 0.5 MG PO TABS: 0.5000 mg | ORAL_TABLET | Freq: Two times a day (BID) | ORAL | 2 refills | Status: DC | PRN

## 2017-05-16 MED ORDER — TRAMADOL HCL 50 MG PO TABS: 50.0000 mg | ORAL_TABLET | Freq: Three times a day (TID) | ORAL | 3 refills | Status: DC | PRN

## 2017-05-16 MED ORDER — AMLODIPINE BESYLATE 5 MG PO TABS: 5.0000 mg | ORAL_TABLET | Freq: Every day | ORAL | 3 refills | Status: DC

## 2017-05-16 NOTE — Progress Notes (Signed)
Subjective:  John Costa is a 51 y.o. year old very pleasant male patient who presents for/with See problem oriented charting ROS- No chest pain or shortness of breath. No headache or blurry vision.  Does have periods of panic and increased anxiety.    Past Medical History-  Patient Active Problem List   Diagnosis Date Noted  . Aneurysm, carotid artery, internal 02/18/2011    Priority: High  . History of adenomatous polyp of colon 12/25/2015    Priority: Medium  . Panic attacks 11/26/2014    Priority: Medium  . Depression 03/14/2011    Priority: Medium  . Gout 06/12/2007    Priority: Medium  . Allergic rhinitis 11/26/2014    Priority: Low  . Skin lesion 11/26/2014    Priority: Low  . Nephrolithiasis 09/19/2010    Priority: Low  . Pityriasis rosea 01/21/2008    Priority: Low  . Gilbert's disease 06/12/2007    Priority: Low  . Hypertension 05/21/2017    Medications- reviewed and updated Current Outpatient Prescriptions  Medication Sig Dispense Refill  . allopurinol (ZYLOPRIM) 100 MG tablet TAKE 1 TABLET BY MOUTH EVERY DAY 90 tablet 1  . clonazePAM (KLONOPIN) 0.5 MG tablet Take 1 tablet (0.5 mg total) by mouth 2 (two) times daily as needed for anxiety. 60 tablet 2  . colchicine (COLCRYS) 0.6 MG tablet TAKE 1 TABLET BY MOUTH TWICE DAILY 10 tablet 1  . ibuprofen (ADVIL,MOTRIN) 200 MG tablet Take 800 mg by mouth every 6 (six) hours as needed for moderate pain. Reported on 11/19/2015    . traMADol (ULTRAM) 50 MG tablet Take 1 tablet (50 mg total) by mouth every 8 (eight) hours as needed. For pain 60 tablet 3  . amLODipine (NORVASC) 5 MG tablet Take 1 tablet (5 mg total) by mouth daily. 30 tablet 3   No current facility-administered medications for this visit.     Objective: BP (!) 138/102 (BP Location: Right Arm)   Pulse 88   Temp 98.3 F (36.8 C) (Oral)   Wt 206 lb (93.4 kg)   SpO2 98%   BMI 29.56 kg/m  Gen: NAD, resting comfortably No thyromegaly CV: RRR no  murmurs rubs or gallops Lungs: CTAB no crackles, wheeze, rhonchi Ext: no edema Skin: warm, dry No pain with palpation of leg  Assessment/Plan:  Leg cramps S: also 2-3x a week gets intense pain in left upper leg both thigh and hamstring- has to squeeze the area and gets some relief. Never happens with workout- always happens when completely inactive.  A/P: these could be cramps- no obvious electrolyte imbalance on bmet- if persists may also get magnesium. Will need to double check pulses at follow up  Hypertension S: controlled on poorly on no medication . 3 months ago had a blood pressure check at [facility] when doing a blood donation. Had lost about 40 lbs at that time so surprised him- had just come from working out. Also in march had high BP but thought to be related to illness at that time.   Some monitoring at [facility] was 130/90- then got cuff at home. Had already cleaned up diet so reduced salt further. Lie down and meditates is the only way to get it down into 130s over 80s.   Average around 145/85-90 at home. Has also had pulsatile tinnitus intermittently for last few months- wooshing sensation. Also has had decrease in libido and in ability to erectile. Sometimes has periods where he will feel very sweaty- does have history  of panic attacks- feels more anxious with those periods BP Readings from Last 3 Encounters:  05/16/17 (!) 138/102  11/29/16 (!) 140/98  11/25/16 118/88  A/P: We discussed blood pressure goal of <140/90. He is above that most of the time and has 2 elevated readings in office - we will start amlodipine 5mg . TSH and BMET were normal- so no hyperthyroidism or kidney failure/volume excess as reason for BP elevation. Will get urine catecholamines and metanephrines as this increase in BP went along with weight loss which is atypical and he is having some periods of feeling overheated/panicked- though does have panic attack history. Did have trace protein and will repeat that  at follow up in about 1 month  Also with pulsatile tinnitus refer to ENT- especially with history of interal carotid artery aneurysm- John Reef Southern-Hizer, LPN will call him to enter this once hes informed  Follow up 1 month  Orders Placed This Encounter  Procedures  . Basic metabolic panel    [place]  . TSH    [place]  . Metanephrines, urine, 24 hour    Standing Status:   Future    Number of Occurrences:   1    Standing Expiration Date:   05/16/2018  . Catecholamines, fractionated, urine, 24 hour    Standing Status:   Future    Number of Occurrences:   1    Standing Expiration Date:   05/16/2018  . POCT Urinalysis Dipstick (Automated)    Meds ordered this encounter  Medications  . clonazePAM (KLONOPIN) 0.5 MG tablet    Sig: Take 1 tablet (0.5 mg total) by mouth 2 (two) times daily as needed for anxiety.    Dispense:  60 tablet    Refill:  2  . traMADol (ULTRAM) 50 MG tablet    Sig: Take 1 tablet (50 mg total) by mouth every 8 (eight) hours as needed. For pain    Dispense:  60 tablet    Refill:  3  . amLODipine (NORVASC) 5 MG tablet    Sig: Take 1 tablet (5 mg total) by mouth daily.    Dispense:  30 tablet    Refill:  3    Return precautions advised.  Garret Reddish, MD

## 2017-05-16 NOTE — Telephone Encounter (Signed)
Received PA request for Tramadol. PA submitted & is pending. Key: [id number]

## 2017-05-16 NOTE — Patient Instructions (Addendum)
Tdap once blood pressure controlled  Please stop by lab before you go  Need to get set up for 24 hour urine collection- so give urine, give blood, but also get collection kit for the urine test  Start amlodipine 52m after at least blood and urine tests from today come back. Other options we could consider- lisinopril or losartan  Amlodipine tablets What is this medicine? AMLODIPINE (am LOE di peen) is a calcium-channel blocker. It affects the amount of calcium found in your heart and muscle cells. This relaxes your blood vessels, which can reduce the amount of work the heart has to do. This medicine is used to lower high blood pressure. It is also used to prevent chest pain. This medicine may be used for other purposes; ask your health care provider or pharmacist if you have questions. COMMON BRAND NAME(S): Norvasc What should I tell my health care provider before I take this medicine? They need to know if you have any of these conditions: -heart problems like heart failure or aortic stenosis -liver disease -an unusual or allergic reaction to amlodipine, other medicines, foods, dyes, or preservatives -pregnant or trying to get pregnant -breast-feeding How should I use this medicine? Take this medicine by mouth with a glass of water. Follow the directions on the prescription label. Take your medicine at regular intervals. Do not take more medicine than directed. Talk to your pediatrician regarding the use of this medicine in children. Special care may be needed. This medicine has been used in children as young as 6. Persons over 621years old may have a stronger reaction to this medicine and need smaller doses. Overdosage: If you think you have taken too much of this medicine contact a poison control center or emergency room at once. NOTE: This medicine is only for you. Do not share this medicine with others. What if I miss a dose? If you miss a dose, take it as soon as you can. If it is  almost time for your next dose, take only that dose. Do not take double or extra doses. What may interact with this medicine? -herbal or dietary supplements -local or general anesthetics -medicines for high blood pressure -medicines for prostate problems -rifampin This list may not describe all possible interactions. Give your health care provider a list of all the medicines, herbs, non-prescription drugs, or dietary supplements you use. Also tell them if you smoke, drink alcohol, or use illegal drugs. Some items may interact with your medicine. What should I watch for while using this medicine? Visit your doctor or health care professional for regular check ups. Check your blood pressure and pulse rate regularly. Ask your health care professional what your blood pressure and pulse rate should be, and when you should contact him or her. This medicine may make you feel confused, dizzy or lightheaded. Do not drive, use machinery, or do anything that needs mental alertness until you know how this medicine affects you. To reduce the risk of dizzy or fainting spells, do not sit or stand up quickly, especially if you are an older patient. Avoid alcoholic drinks; they can make you more dizzy. Do not suddenly stop taking amlodipine. Ask your doctor or health care professional how you can gradually reduce the dose. What side effects may I notice from receiving this medicine? Side effects that you should report to your doctor or health care professional as soon as possible: -allergic reactions like skin rash, itching or hives, swelling of the face, lips, or  tongue -breathing problems -changes in vision or hearing -chest pain -fast, irregular heartbeat -swelling of legs or ankles Side effects that usually do not require medical attention (report to your doctor or health care professional if they continue or are bothersome): -dry mouth -facial flushing -nausea, vomiting -stomach gas, pain -tired,  weak -trouble sleeping This list may not describe all possible side effects. Call your doctor for medical advice about side effects. You may report side effects to FDA at 1-800-FDA-1088. Where should I keep my medicine? Keep out of the reach of children. Store at room temperature between 59 and 86 degrees F (15 and 30 degrees C). Protect from light. Keep container tightly closed. Throw away any unused medicine after the expiration date. NOTE: This sheet is a summary. It may not cover all possible information. If you have questions about this medicine, talk to your doctor, pharmacist, or health care provider.  2018 Elsevier/Gold Standard (2012-08-03 11:40:58)

## 2017-05-17 NOTE — Telephone Encounter (Signed)
PA approved, form faxed back to pharmacy. 

## 2017-05-21 ENCOUNTER — Encounter: Payer: Self-pay | Admitting: Family Medicine

## 2017-05-21 DIAGNOSIS — I10 Essential (primary) hypertension: Secondary | ICD-10-CM | POA: Insufficient documentation

## 2017-05-21 NOTE — Assessment & Plan Note (Signed)
S: controlled on poorly on no medication . 3 months ago had a blood pressure check at [facility] when doing a blood donation. Had lost about 40 lbs at that time so surprised him- had just come from working out. Also in march had high BP but thought to be related to illness at that time.   Some monitoring at [facility] was 130/90- then got cuff at home. Had already cleaned up diet so reduced salt further. Lie down and meditates is the only way to get it down into 130s over 80s.   Average around 145/85-90 at home. Has also had pulsatile tinnitus intermittently for last few months- wooshing sensation. Also has had decrease in libido and in ability to erectile. Sometimes has periods where he will feel very sweaty- does have history of panic attacks- feels more anxious with those periods BP Readings from Last 3 Encounters:  05/16/17 (!) 138/102  11/29/16 (!) 140/98  11/25/16 118/88  A/P: We discussed blood pressure goal of <140/90. He is above that most of the time and has 2 elevated readings in office - we will start amlodipine 5mg . TSH and BMET were normal- so no hyperthyroidism or kidney failure/volume excess as reason for BP elevation. Will get urine catecholamines and metanephrines as this increase in BP went along with weight loss which is atypical and he is having some periods of feeling overheated/panicked- though does have panic attack history. Did have trace protein and will repeat that at follow up in about 1 month

## 2017-05-23 ENCOUNTER — Telehealth: Payer: Self-pay

## 2017-05-23 ENCOUNTER — Other Ambulatory Visit: Payer: Self-pay

## 2017-05-23 DIAGNOSIS — H93A9 Pulsatile tinnitus, unspecified ear: Secondary | ICD-10-CM

## 2017-05-23 LAB — METANEPHRINES, URINE, 24 HOUR
Metaneph Total, Ur: 494 mcg/24 h (ref 224–832)
Metanephrines, Ur: 140 mcg/24 h (ref 90–315)
Normetanephrine, 24H Ur: 354 mcg/24 h (ref 122–676)

## 2017-05-23 LAB — CATECHOLAMINES, FRACTIONATED, URINE, 24 HOUR
Calculated Total (E+NE): 53 mcg/24 h (ref 26–121)
Creatinine, Urine mg/day-CATEUR: 1.78 g/(24.h) (ref 0.63–2.50)
Dopamine, 24 hr Urine: 205 mcg/24 h (ref 52–480)
Norepinephrine, 24 hr Ur: 53 mcg/24 h (ref 15–100)
Total Volume - CF 24Hr U: 1800 mL

## 2017-05-23 NOTE — Telephone Encounter (Signed)
-----   Message from Marin Olp, MD sent at 05/21/2017  7:53 AM EDT ----- Can you call patient and tell him I did not place referral to ENT at the time of his visit but given his pulsatile tinnitus I would like to do this- can you enter it if he is ok with that?

## 2017-05-23 NOTE — Telephone Encounter (Signed)
Spoke with patient and placed a referral to ENT. Patient was ok with this. He knows to be on the lookout for their call to schedule him

## 2017-09-13 ENCOUNTER — Other Ambulatory Visit: Payer: Self-pay

## 2017-09-13 MED ORDER — AMLODIPINE BESYLATE 5 MG PO TABS: 5.0000 mg | ORAL_TABLET | Freq: Every day | ORAL | 3 refills | Status: DC

## 2017-11-18 ENCOUNTER — Other Ambulatory Visit: Payer: Self-pay

## 2017-11-18 ENCOUNTER — Emergency Department (HOSPITAL_COMMUNITY): Payer: BC Managed Care – PPO

## 2017-11-18 ENCOUNTER — Inpatient Hospital Stay (HOSPITAL_COMMUNITY)
Admission: EM | Admit: 2017-11-18 | Discharge: 2017-11-21 | DRG: 419 | Disposition: A | Discharge status: Home / Self Care | Payer: BC Managed Care – PPO | Attending: General Surgery | Admitting: General Surgery

## 2017-11-18 ENCOUNTER — Encounter (HOSPITAL_COMMUNITY): Payer: Self-pay | Admitting: Emergency Medicine

## 2017-11-18 DIAGNOSIS — F329 Major depressive disorder, single episode, unspecified: Secondary | ICD-10-CM | POA: Diagnosis present

## 2017-11-18 DIAGNOSIS — J309 Allergic rhinitis, unspecified: Secondary | ICD-10-CM | POA: Diagnosis present

## 2017-11-18 DIAGNOSIS — I10 Essential (primary) hypertension: Secondary | ICD-10-CM | POA: Diagnosis present

## 2017-11-18 DIAGNOSIS — F410 Panic disorder [episodic paroxysmal anxiety] without agoraphobia: Secondary | ICD-10-CM | POA: Diagnosis present

## 2017-11-18 DIAGNOSIS — E669 Obesity, unspecified: Secondary | ICD-10-CM | POA: Diagnosis present

## 2017-11-18 DIAGNOSIS — M109 Gout, unspecified: Secondary | ICD-10-CM | POA: Diagnosis present

## 2017-11-18 DIAGNOSIS — F32A Depression, unspecified: Secondary | ICD-10-CM | POA: Diagnosis present

## 2017-11-18 DIAGNOSIS — K8012 Calculus of gallbladder with acute and chronic cholecystitis without obstruction: Secondary | ICD-10-CM | POA: Diagnosis present

## 2017-11-18 DIAGNOSIS — K219 Gastro-esophageal reflux disease without esophagitis: Secondary | ICD-10-CM | POA: Diagnosis present

## 2017-11-18 DIAGNOSIS — K810 Acute cholecystitis: Secondary | ICD-10-CM | POA: Diagnosis present

## 2017-11-18 DIAGNOSIS — K8020 Calculus of gallbladder without cholecystitis without obstruction: Secondary | ICD-10-CM

## 2017-11-18 DIAGNOSIS — Z885 Allergy status to narcotic agent status: Secondary | ICD-10-CM

## 2017-11-18 DIAGNOSIS — F419 Anxiety disorder, unspecified: Secondary | ICD-10-CM | POA: Diagnosis present

## 2017-11-18 DIAGNOSIS — Z6828 Body mass index (BMI) 28.0-28.9, adult: Secondary | ICD-10-CM | POA: Diagnosis not present

## 2017-11-18 DIAGNOSIS — R112 Nausea with vomiting, unspecified: Secondary | ICD-10-CM

## 2017-11-18 DIAGNOSIS — D729 Disorder of white blood cells, unspecified: Secondary | ICD-10-CM

## 2017-11-18 DIAGNOSIS — Z91018 Allergy to other foods: Secondary | ICD-10-CM

## 2017-11-18 DIAGNOSIS — R1011 Right upper quadrant pain: Secondary | ICD-10-CM

## 2017-11-18 DIAGNOSIS — K82A1 Gangrene of gallbladder in cholecystitis: Secondary | ICD-10-CM | POA: Diagnosis present

## 2017-11-18 LAB — CBC WITH DIFFERENTIAL/PLATELET
Basophils Absolute: 0 10*3/uL (ref 0.0–0.1)
Basophils Relative: 0 %
Eosinophils Absolute: 0.2 10*3/uL (ref 0.0–0.7)
Eosinophils Relative: 2 %
HCT: 43.6 % (ref 39.0–52.0)
Hemoglobin: 15.1 g/dL (ref 13.0–17.0)
Lymphocytes Relative: 10 %
Lymphs Abs: 1.3 10*3/uL (ref 0.7–4.0)
MCH: 30.2 pg (ref 26.0–34.0)
MCHC: 34.6 g/dL (ref 30.0–36.0)
MCV: 87.2 fL (ref 78.0–100.0)
Monocytes Absolute: 1.1 10*3/uL — ABNORMAL HIGH (ref 0.1–1.0)
Monocytes Relative: 8 %
Neutro Abs: 11.1 10*3/uL — ABNORMAL HIGH (ref 1.7–7.7)
Neutrophils Relative %: 80 %
Platelets: 241 10*3/uL (ref 150–400)
RBC: 5 MIL/uL (ref 4.22–5.81)
RDW: 14.8 % (ref 11.5–15.5)
WBC: 13.7 10*3/uL — ABNORMAL HIGH (ref 4.0–10.5)

## 2017-11-18 LAB — URINALYSIS, ROUTINE W REFLEX MICROSCOPIC
Bilirubin Urine: NEGATIVE
Glucose, UA: NEGATIVE mg/dL
Hgb urine dipstick: NEGATIVE
Ketones, ur: 20 mg/dL — AB
Leukocytes, UA: NEGATIVE
Nitrite: NEGATIVE
Protein, ur: NEGATIVE mg/dL
Specific Gravity, Urine: 1.008 (ref 1.005–1.030)
pH: 7 (ref 5.0–8.0)

## 2017-11-18 LAB — COMPREHENSIVE METABOLIC PANEL
ALT: 23 U/L (ref 17–63)
AST: 27 U/L (ref 15–41)
Albumin: 4.7 g/dL (ref 3.5–5.0)
Alkaline Phosphatase: 68 U/L (ref 38–126)
Anion gap: 11 (ref 5–15)
BUN: 12 mg/dL (ref 6–20)
CO2: 24 mmol/L (ref 22–32)
Calcium: 10.2 mg/dL (ref 8.9–10.3)
Chloride: 104 mmol/L (ref 101–111)
Creatinine, Ser: 0.92 mg/dL (ref 0.61–1.24)
GFR calc Af Amer: >60 mL/min (ref >60)
GFR calc non Af Amer: >60 mL/min (ref >60)
Glucose, Bld: 98 mg/dL (ref 65–99)
Potassium: 4.2 mmol/L (ref 3.5–5.1)
Sodium: 139 mmol/L (ref 135–145)
Total Bilirubin: 2.7 mg/dL — ABNORMAL HIGH (ref 0.3–1.2)
Total Protein: 8.2 g/dL — ABNORMAL HIGH (ref 6.5–8.1)

## 2017-11-18 LAB — I-STAT TROPONIN, ED: Troponin i, poc: 0 ng/mL (ref 0.00–0.08)

## 2017-11-18 LAB — LIPASE, BLOOD: Lipase: 30 U/L (ref 11–51)

## 2017-11-18 MED ORDER — HYDROMORPHONE HCL 1 MG/ML IJ SOLN
1.0000 mg | Freq: Once | INTRAMUSCULAR | Status: AC
  Administered 2017-11-18: 1 mg via INTRAVENOUS
  Filled 2017-11-18: qty 1

## 2017-11-18 MED ORDER — MORPHINE SULFATE (PF) 4 MG/ML IV SOLN
4.0000 mg | Freq: Once | INTRAVENOUS | Status: AC
  Administered 2017-11-18: 4 mg via INTRAVENOUS
  Filled 2017-11-18: qty 1

## 2017-11-18 MED ORDER — PANTOPRAZOLE SODIUM 40 MG IV SOLR
40.0000 mg | Freq: Every day | INTRAVENOUS | Status: DC
  Administered 2017-11-18: 40 mg via INTRAVENOUS
  Filled 2017-11-18: qty 40

## 2017-11-18 MED ORDER — ONDANSETRON 4 MG PO TBDP
4.0000 mg | ORAL_TABLET | Freq: Once | ORAL | Status: AC | PRN
  Administered 2017-11-18: 4 mg via ORAL
  Filled 2017-11-18: qty 1

## 2017-11-18 MED ORDER — PIPERACILLIN-TAZOBACTAM 3.375 G IVPB
3.3750 g | Freq: Three times a day (TID) | INTRAVENOUS | Status: DC
  Administered 2017-11-18 – 2017-11-21 (×9): 3.375 g via INTRAVENOUS
  Filled 2017-11-18 (×9): qty 50

## 2017-11-18 MED ORDER — ONDANSETRON HCL 4 MG/2ML IJ SOLN: 4.0000 mg | Freq: Four times a day (QID) | INTRAMUSCULAR | Status: DC | PRN

## 2017-11-18 MED ORDER — HEPARIN SODIUM (PORCINE) 5000 UNIT/ML IJ SOLN
5000.0000 [IU] | Freq: Once | INTRAMUSCULAR | Status: AC
  Administered 2017-11-18: 5000 [IU] via SUBCUTANEOUS
  Filled 2017-11-18: qty 1

## 2017-11-18 MED ORDER — TRAMADOL HCL 50 MG PO TABS
50.0000 mg | ORAL_TABLET | Freq: Four times a day (QID) | ORAL | Status: DC | PRN
  Administered 2017-11-20 – 2017-11-21 (×2): 50 mg via ORAL
  Filled 2017-11-18 (×3): qty 1

## 2017-11-18 MED ORDER — HYDROMORPHONE HCL 1 MG/ML IJ SOLN
1.0000 mg | INTRAMUSCULAR | Status: DC | PRN
  Administered 2017-11-18 – 2017-11-19 (×7): 1 mg via INTRAVENOUS
  Filled 2017-11-18 (×7): qty 1

## 2017-11-18 MED ORDER — ONDANSETRON 4 MG PO TBDP
4.0000 mg | ORAL_TABLET | Freq: Four times a day (QID) | ORAL | Status: DC | PRN
Start: 2017-11-18 — End: 2017-11-21

## 2017-11-18 MED ORDER — HYDRALAZINE HCL 20 MG/ML IJ SOLN: 10.0000 mg | INTRAMUSCULAR | Status: DC | PRN

## 2017-11-18 MED ORDER — SODIUM CHLORIDE 0.9 % IV BOLUS (SEPSIS)
1000.0000 mL | Freq: Once | INTRAVENOUS | Status: AC
  Administered 2017-11-18: 1000 mL via INTRAVENOUS

## 2017-11-18 MED ORDER — ONDANSETRON HCL 4 MG/2ML IJ SOLN
4.0000 mg | Freq: Once | INTRAMUSCULAR | Status: AC
  Administered 2017-11-18: 4 mg via INTRAVENOUS
  Filled 2017-11-18: qty 2

## 2017-11-18 MED ORDER — CLONAZEPAM 0.5 MG PO TABS
0.5000 mg | ORAL_TABLET | Freq: Two times a day (BID) | ORAL | Status: DC | PRN
  Filled 2017-11-18: qty 1

## 2017-11-18 MED ORDER — POTASSIUM CHLORIDE IN NACL 20-0.9 MEQ/L-% IV SOLN
INTRAVENOUS | Status: DC
  Administered 2017-11-18: via INTRAVENOUS
  Filled 2017-11-18 (×3): qty 1000

## 2017-11-18 NOTE — ED Notes (Signed)
Pt urinated a few mins before I got in the room to ask. Will check back

## 2017-11-18 NOTE — ED Notes (Signed)
John Costa [phone]. Please give update to patients friend.

## 2017-11-18 NOTE — ED Triage Notes (Signed)
Patient believes he is having gallbladder attack. RUQ pain onset of yesterday. C/o nausea and fever last night. States pain worsening causing difficulty breathing.

## 2017-11-18 NOTE — ED Notes (Signed)
Called lab to inquire about CBC results, per [facility] request. Lab reports they will be running that test now.

## 2017-11-18 NOTE — ED Notes (Signed)
Pt had drawn for labs:  Red Blue Gold Lavender Lt green  Dark green x2

## 2017-11-18 NOTE — ED Notes (Signed)
ED TO INPATIENT HANDOFF REPORT  Name/Age/Gender [name] 52 y.o. male  Code Status Code Status History    This patient does not have a recorded code status. Please follow your organizational policy for patients in this situation.      Home/SNF/Other Home  Chief Complaint gallbladder attack/ possible blockage   Level of Care/Admitting Diagnosis ED Disposition    ED Disposition Condition Comment   Admit  Hospital Area: [place] [[id number]]  Level of Care: Med-Surg [16]  Diagnosis: Cholelithiasis and acute cholecystitis without obstruction [[id number]]  Admitting Physician: Lewis Shock  Attending Physician: CCS, MD [[id number]]  Estimated length of stay: past midnight tomorrow  Certification:: I certify this patient will need inpatient services for at least 2 midnights  PT Class (Do Not Modify): Inpatient [101]  PT Acc Code (Do Not Modify): Private [1]       Medical History Past Medical History:  Diagnosis Date  . Allergy   . Anal fissure   . Anemia   . Anxiety   . Cataract    removed bilateral  . Cerebral aneurysm   . CHOLELITHIASIS 06/12/2007   Qualifier: Diagnosis of  By: Scherrie Gerlach    . Depression    no treatment  . GERD (gastroesophageal reflux disease)    occasional  . Gilbert's syndrome   . Gout   . Headache(784.0)    from cerebral aneurysm  2008  . HTN (hypertension)   . Migraines   . Nephrolithiasis 09/19/2010  . Pelvic kidney   . Pneumonia    2010  . PONV (postoperative nausea and vomiting)     Allergies Allergies  Allergen Reactions  . Cheese Anaphylaxis  . Vicodin [Hydrocodone-Acetaminophen] Other (See Comments)    vicodin gives him a headache    IV Location/Drains/Wounds Patient Lines/Drains/Airways Status   Active Line/Drains/Airways    Name:   Placement date:   Placement time:   Site:   Days:   Peripheral IV 11/18/17 Right Antecubital   11/18/17    1719    Antecubital   less than 1           Labs/Imaging Results for orders placed or performed during the hospital encounter of 11/18/17 (from the past 48 hour(s))  Lipase, blood     Status: None   Collection Time: 11/18/17  4:06 PM  Result Value Ref Range   Lipase 30 11 - 51 U/L    Comment: Performed at [facility], [place] [address] [place], [place] [address]  Comprehensive metabolic panel     Status: Abnormal   Collection Time: 11/18/17  4:06 PM  Result Value Ref Range   Sodium 139 135 - 145 mmol/L   Potassium 4.2 3.5 - 5.1 mmol/L   Chloride 104 101 - 111 mmol/L   CO2 24 22 - 32 mmol/L   Glucose, Bld 98 65 - 99 mg/dL   BUN 12 6 - 20 mg/dL   Creatinine, Ser 0.92 0.61 - 1.24 mg/dL   Calcium 10.2 8.9 - 10.3 mg/dL   Total Protein 8.2 (H) 6.5 - 8.1 g/dL   Albumin 4.7 3.5 - 5.0 g/dL   AST 27 15 - 41 U/L   ALT 23 17 - 63 U/L   Alkaline Phosphatase 68 38 - 126 U/L   Total Bilirubin 2.7 (H) 0.3 - 1.2 mg/dL   GFR calc non Af Amer >60 >60 mL/min   GFR calc Af Amer >60 >60 mL/min    Comment: (NOTE)  The eGFR has been calculated using the CKD EPI equation. This calculation has not been validated in all clinical situations. eGFR's persistently <60 mL/min signify possible Chronic Kidney Disease.    Anion gap 11 5 - 15    Comment: Performed at [facility], [place] [address], [place], [place] [address]  CBC with Differential     Status: Abnormal   Collection Time: 11/18/17  4:30 PM  Result Value Ref Range   WBC 13.7 (H) 4.0 - 10.5 K/uL   RBC 5.00 4.22 - 5.81 MIL/uL   Hemoglobin 15.1 13.0 - 17.0 g/dL   HCT 43.6 39.0 - 52.0 %   MCV 87.2 78.0 - 100.0 fL   MCH 30.2 26.0 - 34.0 pg   MCHC 34.6 30.0 - 36.0 g/dL   RDW 14.8 11.5 - 15.5 %   Platelets 241 150 - 400 K/uL   Neutrophils Relative % 80 %   Neutro Abs 11.1 (H) 1.7 - 7.7 K/uL   Lymphocytes Relative 10 %   Lymphs Abs 1.3 0.7 - 4.0 K/uL   Monocytes Relative 8 %   Monocytes Absolute 1.1 (H) 0.1 - 1.0 K/uL   Eosinophils Relative 2 %    Eosinophils Absolute 0.2 0.0 - 0.7 K/uL   Basophils Relative 0 %   Basophils Absolute 0.0 0.0 - 0.1 K/uL    Comment: Performed at [facility], [place] [address] [place], [place] [address]  I-stat troponin, ED     Status: None   Collection Time: 11/18/17  5:10 PM  Result Value Ref Range   Troponin i, poc 0.00 0.00 - 0.08 ng/mL   Comment 3            Comment: Due to the release kinetics of cTnI, a negative result within the first hours of the onset of symptoms does not rule out myocardial infarction with certainty. If myocardial infarction is still suspected, repeat the test at appropriate intervals.   Urinalysis, Routine w reflex microscopic     Status: Abnormal   Collection Time: 11/18/17  5:55 PM  Result Value Ref Range   Color, Urine YELLOW YELLOW   APPearance CLEAR CLEAR   Specific Gravity, Urine 1.008 1.005 - 1.030   pH 7.0 5.0 - 8.0   Glucose, UA NEGATIVE NEGATIVE mg/dL   Hgb urine dipstick NEGATIVE NEGATIVE   Bilirubin Urine NEGATIVE NEGATIVE   Ketones, ur 20 (A) NEGATIVE mg/dL   Protein, ur NEGATIVE NEGATIVE mg/dL   Nitrite NEGATIVE NEGATIVE   Leukocytes, UA NEGATIVE NEGATIVE    Comment: Performed at [facility] [address], [place], [place] [address]   US Abdomen Complete  Result Date: 11/18/2017 CLINICAL DATA:  Right upper quadrant pain.  History of gallstones. EXAM: ABDOMEN ULTRASOUND COMPLETE COMPARISON:  CT abdomen pelvis 08/29/2015; ultrasound abdomen 09/22/2015. FINDINGS: Gallbladder: The gallbladder is filled with echogenic material. No gallbladder wall thickening. No sonographic Murphy sign. Common bile duct: Diameter: 4 mm Liver: No focal lesion identified. Within normal limits in parenchymal echogenicity. Portal vein is patent on color Doppler imaging with normal direction of blood flow towards the liver. IVC: No abnormality visualized. Pancreas: Visualized portion unremarkable. Spleen: Size and appearance within normal  limits. Right Kidney: Length: 12.4 cm. Echogenicity within normal limits. No mass or hydronephrosis visualized. Left Kidney: Length: 10.0 cm. Echogenicity within normal limits. No mass or hydronephrosis visualized. Possible stone lower pole left kidney. Abdominal aorta: No aneurysm visualized. Other findings: None. IMPRESSION: 1. The gallbladder is filled with  echogenic material most compatible with stones/sludge. No secondary signs to suggest acute cholecystitis. No CBD ductal dilatation. In the nonacute setting, consider further evaluation with CT or MRI to exclude the possibility of gallbladder mass. 2. No hydronephrosis. Electronically Signed   By: Lovey Newcomer M.D.   On: 11/18/2017 20:01    Pending Labs Unresulted Labs (From admission, onward)   Start     Ordered   Signed and Held  HIV antibody (Routine Testing)  Once,   R     Signed and Held      Vitals/Pain Today's Vitals   11/18/17 1930 11/18/17 2030 11/18/17 2130 11/18/17 2200  BP: (!) 150/87 (!) 144/79 139/75 131/75  Pulse: 89 98 92 87  Resp: 18     Temp:      TempSrc:      SpO2: 94% 98% 95% 98%  Weight:      Height:      PainSc:        Isolation Precautions No active isolations  Medications Medications  HYDROmorphone (DILAUDID) injection 1 mg (1 mg Intravenous Given 11/18/17 2145)  ondansetron (ZOFRAN-ODT) disintegrating tablet 4 mg (4 mg Oral Given 11/18/17 1616)  sodium chloride 0.9 % bolus 1,000 mL (0 mLs Intravenous Stopped 11/18/17 1741)  morphine 4 MG/ML injection 4 mg (4 mg Intravenous Given 11/18/17 1701)  ondansetron (ZOFRAN) injection 4 mg (4 mg Intravenous Given 11/18/17 1701)  HYDROmorphone (DILAUDID) injection 1 mg (1 mg Intravenous Given 11/18/17 1749)    Mobility walks

## 2017-11-18 NOTE — ED Provider Notes (Signed)
[facility] DEPT Provider Note   CSN: [id number] Arrival date & time: 11/18/17  1535     History   Chief Complaint Chief Complaint  Patient presents with  . Abdominal Pain    HPI John Costa is a 52 y.o. male with a PMHx of cholelithiasis, GERD, gilbert's syndrome, gout, HTN, nephrolithiasis, pelvic kidney, and other conditions listed below, who presents to the ED with complaints of sudden onset RUQ pain, nausea, vomiting, fever, and chills that began around 11 PM last night.  He states that initially he felt like he had indigestion, spent all night vomiting, earlier today he had vomited and finally felt somewhat better and went to sleep for about 1 hour but when he woke up his pain had significantly worsened so he finally came to the emergency room.  He states this feels similar to his prior gallstone attack 2 years ago.  He reports that this does not feel like his prior kidney stones.  He describes the pain as 9/10 dull and achy with occasional sharp constant RUQ pain that radiates into his upper back, worse with breathing, sitting down or laying, and unrelieved with ibuprofen.  He reports associated nausea, 20 episodes of nonbloody nonbilious emesis, fever with Tmax 103.7, and chills.  He endorses fairly frequent NSAID use.  His PCP is Dr. Garret Reddish.  He ate a small amount of a smoothie around 9:30 AM but has not had anything else to eat or drink today, his last meal was yesterday at dinnertime around 6-7 PM.  He denies CP, SOB, diarrhea/constipation, obstipation, melena, hematochezia, hematemesis, hematuria, dysuria, flank pain, testicular pain/swelling, penile discharge, myalgias, arthralgias, numbness, tingling, focal weakness, or any other complaints at this time. Denies recent travel, sick contacts, suspicious food intake, frequent EtOH use, or prior abd surgeries.  He reports he had a few sips of a beer last night.    The history is provided by  the patient and medical records. No language interpreter was used.  Abdominal Pain   This is a new problem. The current episode started yesterday. The problem occurs constantly. The problem has been gradually worsening. The pain is associated with an unknown factor. The pain is located in the RUQ. The quality of the pain is dull, aching and sharp. The pain is at a severity of 9/10. The pain is moderate. Associated symptoms include fever, nausea and vomiting. Pertinent negatives include diarrhea, flatus, hematochezia, melena, constipation, dysuria, hematuria, arthralgias and myalgias. The symptoms are aggravated by certain positions and deep breathing. Nothing relieves the symptoms. His past medical history is significant for gallstones.    Past Medical History:  Diagnosis Date  . Allergy   . Anal fissure   . Anemia   . Anxiety   . Cataract    removed bilateral  . Cerebral aneurysm   . CHOLELITHIASIS 06/12/2007   Qualifier: Diagnosis of  By: Scherrie Gerlach    . Depression    no treatment  . GERD (gastroesophageal reflux disease)    occasional  . Gilbert's syndrome   . Gout   . Headache(784.0)    from cerebral aneurysm  2008  . HTN (hypertension)   . Migraines   . Nephrolithiasis 09/19/2010  . Pelvic kidney   . Pneumonia    2010  . PONV (postoperative nausea and vomiting)     Patient Active Problem List   Diagnosis Date Noted  . Hypertension 05/21/2017  . History of adenomatous polyp of colon 12/25/2015  .  Panic attacks 11/26/2014  . Allergic rhinitis 11/26/2014  . Skin lesion 11/26/2014  . Depression 03/14/2011  . 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 Nephrolithiasis 09/19/2010  . Pityriasis rosea 01/21/2008  . Gout 06/12/2007  . Gilbert's disease 06/12/2007    Past Surgical History:  Procedure Laterality Date  . ARTERIAL ANEURYSM REPAIR  2008  . CATARACT EXTRACTION     cataracts with lens implants  . CYSTOSCOPY WITH RETROGRADE PYELOGRAM, URETEROSCOPY AND  STENT PLACEMENT Left 08/24/2015   Procedure: CYSTOSCOPY WITHLEFT RETROGRADE PYELOGRAM, URETEROSCOPY AND STENT PLACEMENT;  Surgeon: Raynelle Bring, MD;  Location: [facility];  Service: Urology;  Laterality: Left;  . HOLMIUM LASER APPLICATION Left 82/05/5620   Procedure:  WITH HOLMIUM LASER LITHOTRIPSY;  Surgeon: Raynelle Bring, MD;  Location: [facility];  Service: Urology;  Laterality: Left;  . knee arthroscopies Bilateral    has had this several times  . STONE EXTRACTION WITH BASKET  12/30/10  . UPPER GASTROINTESTINAL ENDOSCOPY         Home Medications    Prior to Admission medications   Medication Sig Start Date End Date Taking? Authorizing Provider  allopurinol (ZYLOPRIM) 100 MG tablet TAKE 1 TABLET BY MOUTH EVERY DAY 12/22/16   Marin Olp, MD  amLODipine (NORVASC) 5 MG tablet Take 1 tablet (5 mg total) by mouth daily. 09/13/17   Marin Olp, MD  clonazePAM (KLONOPIN) 0.5 MG tablet Take 1 tablet (0.5 mg total) by mouth 2 (two) times daily as needed for anxiety. 05/16/17   Marin Olp, MD  colchicine (COLCRYS) 0.6 MG tablet TAKE 1 TABLET BY MOUTH TWICE DAILY 11/25/15   Marin Olp, MD  ibuprofen (ADVIL,MOTRIN) 200 MG tablet Take 800 mg by mouth every 6 (six) hours as needed for moderate pain. Reported on 11/19/2015    [provider]  traMADol (ULTRAM) 50 MG tablet Take 1 tablet (50 mg total) by mouth every 8 (eight) hours as needed. For pain 05/16/17   Marin Olp, MD    Family History Family History  Problem Relation Age of Onset  . Prostate cancer Father 86  . Diabetes Brother   . Hemochromatosis Brother   . Diabetes Sister   . Hemachromatosis Sister   . Colon cancer Neg Hx   . Colon polyps Neg Hx     Social History Social History   Tobacco Use  . Smoking status: Never Smoker  . Smokeless tobacco: Never Used  Substance Use Topics  . Alcohol use: Yes    Alcohol/week: 1.2 - 1.8 oz    Types: 2 - 3 Standard drinks or equivalent per week    Comment:  occasional  . Drug use: Yes    Types: Marijuana    Comment: 1990s, Has done it in legal states such as [place] since that time     Allergies   Cheese and Vicodin [hydrocodone-acetaminophen]   Review of Systems Review of Systems  Constitutional: Positive for chills and fever.  Respiratory: Negative for shortness of breath.   Cardiovascular: Negative for chest pain.  Gastrointestinal: Positive for abdominal pain, nausea and vomiting. Negative for blood in stool, constipation, diarrhea, flatus, hematochezia and melena.  Genitourinary: Negative for discharge, dysuria, flank pain, hematuria, scrotal swelling and testicular pain.  Musculoskeletal: Negative for arthralgias and myalgias.  Skin: Negative for color change.  Allergic/Immunologic: Negative for immunocompromised state.  Neurological: Negative for weakness and numbness.  Psychiatric/Behavioral: Negative for confusion.   All other systems reviewed and are negative for acute change except  as noted in the HPI.    Physical Exam Updated Vital Signs BP (!) 147/102 (BP Location: Left Arm)   Pulse (!) 112   Temp 98.7 F (37.1 C) (Oral)   Resp (!) 22   Ht 5\' 11"  (1.803 m)   Wt 93.4 kg (206 lb)   SpO2 96%   BMI 28.73 kg/m   Physical Exam  Constitutional: He is oriented to person, place, and time. He appears well-developed and well-nourished.  Non-toxic appearance. He appears distressed (appears very uncomfortable).  Afebrile, nontoxic, appears very uncomfortable  HENT:  Head: Normocephalic and atraumatic.  Mouth/Throat: Oropharynx is clear and moist and mucous membranes are normal.  Eyes: Conjunctivae and EOM are normal. Right eye exhibits no discharge. Left eye exhibits no discharge.  Neck: Normal range of motion. Neck supple.  Cardiovascular: Regular rhythm, normal heart sounds and intact distal pulses. Tachycardia present. Exam reveals no gallop and no friction rub.  No murmur heard. Mildly tachycardic likely from pain   Pulmonary/Chest: Effort normal and breath sounds normal. No respiratory distress. He has no decreased breath sounds. He has no wheezes. He has no rhonchi. He has no rales.  Abdominal: Soft. Normal appearance and bowel sounds are normal. He exhibits no distension. There is tenderness in the right upper quadrant, epigastric area and left upper quadrant. There is guarding and positive Murphy's sign. There is no rigidity, no rebound, no CVA tenderness and no tenderness at McBurney's point.  Soft, nondistended, +BS throughout, with significant RUQ TTP and milder epigastric/LUQ TTP, some slight voluntary guarding, no rebound or significant rigidity appreciated, +murphy's, neg mcburney's, no CVA TTP   Musculoskeletal: Normal range of motion.  Neurological: He is alert and oriented to person, place, and time. He has normal strength. No sensory deficit.  Skin: Skin is warm, dry and intact. No rash noted.  Psychiatric: He has a normal mood and affect.  Nursing note and vitals reviewed.    ED Treatments / Results  Labs (all labs ordered are listed, but only abnormal results are displayed) Labs Reviewed  COMPREHENSIVE METABOLIC PANEL - Abnormal; Notable for the following components:      Result Value   Total Protein 8.2 (*)    Total Bilirubin 2.7 (*)    All other components within normal limits  URINALYSIS, ROUTINE W REFLEX MICROSCOPIC - Abnormal; Notable for the following components:   Ketones, ur 20 (*)    All other components within normal limits  CBC WITH DIFFERENTIAL/PLATELET - Abnormal; Notable for the following components:   WBC 13.7 (*)    Neutro Abs 11.1 (*)    Monocytes Absolute 1.1 (*)    All other components within normal limits  LIPASE, BLOOD  I-STAT TROPONIN, ED    EKG  EKG Interpretation  Date/Time:  Saturday November 18 2017 16:56:31 EST Ventricular Rate:  111 PR Interval:    QRS Duration: 86 QT Interval:  333 QTC Calculation: 453 R Axis:   53 Text Interpretation:  Sinus  tachycardia Probable left atrial enlargement Confirmed by Quintella Reichert [id number] on 11/18/2017 5:14:14 PM       Radiology US Abdomen Complete  Result Date: 11/18/2017 CLINICAL DATA:  Right upper quadrant pain.  History of gallstones. EXAM: ABDOMEN ULTRASOUND COMPLETE COMPARISON:  CT abdomen pelvis 08/29/2015; ultrasound abdomen 09/22/2015. FINDINGS: Gallbladder: The gallbladder is filled with echogenic material. No gallbladder wall thickening. No sonographic Murphy sign. Common bile duct: Diameter: 4 mm Liver: No focal lesion identified. Within normal limits in parenchymal echogenicity. Portal vein is  patent on color Doppler imaging with normal direction of blood flow towards the liver. IVC: No abnormality visualized. Pancreas: Visualized portion unremarkable. Spleen: Size and appearance within normal limits. Right Kidney: Length: 12.4 cm. Echogenicity within normal limits. No mass or hydronephrosis visualized. Left Kidney: Length: 10.0 cm. Echogenicity within normal limits. No mass or hydronephrosis visualized. Possible stone lower pole left kidney. Abdominal aorta: No aneurysm visualized. Other findings: None. IMPRESSION: 1. The gallbladder is filled with echogenic material most compatible with stones/sludge. No secondary signs to suggest acute cholecystitis. No CBD ductal dilatation. In the nonacute setting, consider further evaluation with CT or MRI to exclude the possibility of gallbladder mass. 2. No hydronephrosis. Electronically Signed   By: Lovey Newcomer M.D.   On: 11/18/2017 20:01    Procedures Procedures (including critical care time)  Medications Ordered in ED Medications  ondansetron (ZOFRAN-ODT) disintegrating tablet 4 mg (4 mg Oral Given 11/18/17 1616)  sodium chloride 0.9 % bolus 1,000 mL (0 mLs Intravenous Stopped 11/18/17 1741)  morphine 4 MG/ML injection 4 mg (4 mg Intravenous Given 11/18/17 1701)  ondansetron (ZOFRAN) injection 4 mg (4 mg Intravenous Given 11/18/17 1701)  HYDROmorphone  (DILAUDID) injection 1 mg (1 mg Intravenous Given 11/18/17 1749)     Initial Impression / Assessment and Plan / ED Course  I have reviewed the triage vital signs and the nursing notes.  Pertinent labs & imaging results that were available during my care of the patient were reviewed by me and considered in my medical decision making (see chart for details).     52 y.o. male here with sudden onset RUQ pain and n/v, and fever that began at 11pm yesterday. On exam, appears very uncomfortable, tachycardic likely related to pain, significant RUQ TTP with +murphy's, +guarding. I suspect gallbladder/biliary etiology. Will get labs, EKG, and abd u/s. Will give fluids, morphine, zofran, and reassess shortly.   8:12 PM CBC w/diff with neutrophilic leukocytosis. CMP with elevated Tbili 2.7 which is slightly higher than prior values, but otherwise remainder of CMP WNL. Lipase WNL. U/A with a few ketones but otherwise unremarkable. Trop neg. EKG without acute ischemic findings. Abd U/S with gallbladder sludge and stones, but no evidence of cholecystitis. Dr. Excell Seltzer of [facility] came down to see the patient as he was aware of him being here, and he is going to admit the pt for cholecystectomy. Holding orders to be placed by admitting team. Please see their notes for further documentation of care. I appreciate their help with this pleasant pt's care. Pt stable at time of admission.       Final Clinical Impressions(s) / ED Diagnoses   Final diagnoses:  Abdominal pain, acute, right upper quadrant  Nausea and vomiting in adult patient  Neutrophilic leukocytosis  Hyperbilirubinemia  Calculus of gallbladder without cholecystitis without obstruction    ED Discharge Orders    [address] [place], [place] 11/18/17 2013    Quintella Reichert, MD 11/19/17 [phone]

## 2017-11-18 NOTE — H&P (Signed)
John Costa is an 52 y.o. male.    Chief Complaint: Abdominal pain  HPI: Patient is a 52 year old male who presents with the acute onset of right upper quadrant abdominal pain, nausea and vomiting.  He has known gallstones from an ultrasound about 2 years ago after he developed acute epigastric pain at that time that resolved after about 24 hours.  He did not elect to have surgery at that time.  He states he has been symptom-free until this current episode.  Last night he had the fairly rapid onset of frequent nausea and vomiting and soon after developed the gradual onset of right upper quadrant abdominal pain that has become severe.  He describes pressure-like pain radiating up into his chest and through to his back.  He presented to the emergency department today.  The pain was continuous until he received medication.  He did have fever last night at home to 103.  Has not felt feverish since.  Has a history of kidney stones but no hematuria or dysuria.  Also has a history of Gilbert's syndrome and felt that he was jaundiced last night which is now resolved.  All movements normal.  No other history of GI problems.  Past Medical History:  Diagnosis Date  . Allergy   . Anal fissure   . Anemia   . Anxiety   . Cataract    removed bilateral  . Cerebral aneurysm   . CHOLELITHIASIS 06/12/2007   Qualifier: Diagnosis of  By: Scherrie Gerlach    . Depression    no treatment  . GERD (gastroesophageal reflux disease)    occasional  . Gilbert's syndrome   . Gout   . Headache(784.0)    from cerebral aneurysm  2008  . HTN (hypertension)   . Migraines   . Nephrolithiasis 09/19/2010  . Pelvic kidney   . Pneumonia    2010  . PONV (postoperative nausea and vomiting)     Past Surgical History:  Procedure Laterality Date  . ARTERIAL ANEURYSM REPAIR  2008  . CATARACT EXTRACTION     cataracts with lens implants  . CYSTOSCOPY WITH RETROGRADE PYELOGRAM, URETEROSCOPY AND STENT PLACEMENT Left  08/24/2015   Procedure: CYSTOSCOPY WITHLEFT RETROGRADE PYELOGRAM, URETEROSCOPY AND STENT PLACEMENT;  Surgeon: Raynelle Bring, MD;  Location: [facility];  Service: Urology;  Laterality: Left;  . HOLMIUM LASER APPLICATION Left 35/0/0938   Procedure:  WITH HOLMIUM LASER LITHOTRIPSY;  Surgeon: Raynelle Bring, MD;  Location: [facility];  Service: Urology;  Laterality: Left;  . knee arthroscopies Bilateral    has had this several times  . STONE EXTRACTION WITH BASKET  12/30/10  . UPPER GASTROINTESTINAL ENDOSCOPY      Family History  Problem Relation Age of Onset  . Prostate cancer Father 86  . Diabetes Brother   . Hemochromatosis Brother   . Diabetes Sister   . Hemachromatosis Sister   . Colon cancer Neg Hx   . Colon polyps Neg Hx    Social History:  reports that  has never smoked. he has never used smokeless tobacco. He reports that he drinks about 1.2 - 1.8 oz of alcohol per week. He reports that he uses drugs. Drug: Marijuana.  Allergies:  Allergies  Allergen Reactions  . Cheese Anaphylaxis  . Vicodin [Hydrocodone-Acetaminophen] Other (See Comments)    vicodin gives him a headache    No current facility-administered medications for this encounter.    Current Outpatient Medications  Medication Sig Dispense Refill  . allopurinol (ZYLOPRIM)  100 MG tablet TAKE 1 TABLET BY MOUTH EVERY DAY 90 tablet 1  . amLODipine (NORVASC) 5 MG tablet Take 1 tablet (5 mg total) by mouth daily. 30 tablet 3  . CALCIUM & MAGNESIUM CARBONATES PO Take 1 tablet by mouth daily.    . clonazePAM (KLONOPIN) 0.5 MG tablet Take 1 tablet (0.5 mg total) by mouth 2 (two) times daily as needed for anxiety. 60 tablet 2  . ibuprofen (ADVIL,MOTRIN) 200 MG tablet Take 800 mg by mouth every 6 (six) hours as needed for moderate pain. Reported on 11/19/2015    . Loratadine-Pseudoephedrine (CLARITIN-D 24 HOUR PO) Take 1 tablet by mouth every Monday, Wednesday, and Friday.    . Multiple Vitamins-Minerals (ONE-A-DAY 50 PLUS PO) Take 1  tablet by mouth.       Results for orders placed or performed during the hospital encounter of 11/18/17 (from the past 48 hour(s))  Lipase, blood     Status: None   Collection Time: 11/18/17  4:06 PM  Result Value Ref Range   Lipase 30 11 - 51 U/L    Comment: Performed at [facility], [place] [address], [place], [place] [address]  Comprehensive metabolic panel     Status: Abnormal   Collection Time: 11/18/17  4:06 PM  Result Value Ref Range   Sodium 139 135 - 145 mmol/L   Potassium 4.2 3.5 - 5.1 mmol/L   Chloride 104 101 - 111 mmol/L   CO2 24 22 - 32 mmol/L   Glucose, Bld 98 65 - 99 mg/dL   BUN 12 6 - 20 mg/dL   Creatinine, Ser 0.92 0.61 - 1.24 mg/dL   Calcium 10.2 8.9 - 10.3 mg/dL   Total Protein 8.2 (H) 6.5 - 8.1 g/dL   Albumin 4.7 3.5 - 5.0 g/dL   AST 27 15 - 41 U/L   ALT 23 17 - 63 U/L   Alkaline Phosphatase 68 38 - 126 U/L   Total Bilirubin 2.7 (H) 0.3 - 1.2 mg/dL   GFR calc non Af Amer >60 >60 mL/min   GFR calc Af Amer >60 >60 mL/min    Comment: (NOTE) The eGFR has been calculated using the CKD EPI equation. This calculation has not been validated in all clinical situations. eGFR's persistently <60 mL/min signify possible Chronic Kidney Disease.    Anion gap 11 5 - 15    Comment: Performed at [facility], [place] [address], [place], [place] [address]  CBC with Differential     Status: Abnormal   Collection Time: 11/18/17  4:30 PM  Result Value Ref Range   WBC 13.7 (H) 4.0 - 10.5 K/uL   RBC 5.00 4.22 - 5.81 MIL/uL   Hemoglobin 15.1 13.0 - 17.0 g/dL   HCT 43.6 39.0 - 52.0 %   MCV 87.2 78.0 - 100.0 fL   MCH 30.2 26.0 - 34.0 pg   MCHC 34.6 30.0 - 36.0 g/dL   RDW 14.8 11.5 - 15.5 %   Platelets 241 150 - 400 K/uL   Neutrophils Relative % 80 %   Neutro Abs 11.1 (H) 1.7 - 7.7 K/uL   Lymphocytes Relative 10 %   Lymphs Abs 1.3 0.7 - 4.0 K/uL   Monocytes Relative 8 %   Monocytes Absolute 1.1 (H) 0.1 - 1.0 K/uL   Eosinophils Relative  2 %   Eosinophils Absolute 0.2 0.0 - 0.7 K/uL   Basophils Relative 0 %   Basophils Absolute 0.0 0.0 - 0.1 K/uL  Comment: Performed at [facility], [place] [address] [place], [place] [address]  I-stat troponin, ED     Status: None   Collection Time: 11/18/17  5:10 PM  Result Value Ref Range   Troponin i, poc 0.00 0.00 - 0.08 ng/mL   Comment 3            Comment: Due to the release kinetics of cTnI, a negative result within the first hours of the onset of symptoms does not rule out myocardial infarction with certainty. If myocardial infarction is still suspected, repeat the test at appropriate intervals.   Urinalysis, Routine w reflex microscopic     Status: Abnormal   Collection Time: 11/18/17  5:55 PM  Result Value Ref Range   Color, Urine YELLOW YELLOW   APPearance CLEAR CLEAR   Specific Gravity, Urine 1.008 1.005 - 1.030   pH 7.0 5.0 - 8.0   Glucose, UA NEGATIVE NEGATIVE mg/dL   Hgb urine dipstick NEGATIVE NEGATIVE   Bilirubin Urine NEGATIVE NEGATIVE   Ketones, ur 20 (A) NEGATIVE mg/dL   Protein, ur NEGATIVE NEGATIVE mg/dL   Nitrite NEGATIVE NEGATIVE   Leukocytes, UA NEGATIVE NEGATIVE    Comment: Performed at [facility] [address] [place], [place] [address]   US Abdomen Complete  Result Date: 11/18/2017 CLINICAL DATA:  Right upper quadrant pain.  History of gallstones. EXAM: ABDOMEN ULTRASOUND COMPLETE COMPARISON:  CT abdomen pelvis 08/29/2015; ultrasound abdomen 09/22/2015. FINDINGS: Gallbladder: The gallbladder is filled with echogenic material. No gallbladder wall thickening. No sonographic Murphy sign. Common bile duct: Diameter: 4 mm Liver: No focal lesion identified. Within normal limits in parenchymal echogenicity. Portal vein is patent on color Doppler imaging with normal direction of blood flow towards the liver. IVC: No abnormality visualized. Pancreas: Visualized portion unremarkable. Spleen: Size and appearance within  normal limits. Right Kidney: Length: 12.4 cm. Echogenicity within normal limits. No mass or hydronephrosis visualized. Left Kidney: Length: 10.0 cm. Echogenicity within normal limits. No mass or hydronephrosis visualized. Possible stone lower pole left kidney. Abdominal aorta: No aneurysm visualized. Other findings: None. IMPRESSION: 1. The gallbladder is filled with echogenic material most compatible with stones/sludge. No secondary signs to suggest acute cholecystitis. No CBD ductal dilatation. In the nonacute setting, consider further evaluation with CT or MRI to exclude the possibility of gallbladder mass. 2. No hydronephrosis. Electronically Signed   By: Lovey Newcomer M.D.   On: 11/18/2017 20:01    Review of Systems  Constitutional: Positive for fever. Negative for chills.  Respiratory: Negative.   Cardiovascular: Negative.   Gastrointestinal: Positive for abdominal pain, nausea and vomiting. Negative for blood in stool, constipation and diarrhea.  Genitourinary: Negative.     Blood pressure (!) 150/87, pulse 89, temperature 98.7 F (37.1 C), temperature source Oral, resp. rate 18, height 5' 11"  (1.803 m), weight 93.4 kg (206 lb), SpO2 94 %. Physical Exam  General: Alert, mildly obese Caucasian male, in no acute distress Skin: Warm and dry without rash or infection. HEENT: No palpable masses or thyromegaly. Sclera nonicteric. Pupils equal round and reactive.  Lymph nodes: No cervical, supraclavicular, or inguinal nodes palpable. Lungs: Breath sounds clear and equal without increased work of breathing Cardiovascular: Regular rate and rhythm without murmur. No JVD or edema. Peripheral pulses intact. Abdomen: Nondistended.  Localized marked right upper quadrant and some epigastric tenderness with guarding.. No masses palpable. No organomegaly. No palpable hernias. Extremities: No edema or joint swelling or deformity. No chronic venous stasis changes. Neurologic:  Alert and fully oriented.   Affect normal.  No gross motor deficits.   Assessment/Plan Acute severe right upper quadrant abdominal pain, nausea and vomiting with tenderness localized and leukocytosis.  Known gallstones.  Current ultrasound shows gallbladder full of echogenic material, probable stones and sludge.  No imaging evidence of acute cholecystitis but clinically he certainly appears to have acute cholecystitis.  I have recommended admission tonight with IV fluids and IV antibiotics and urgent laparoscopic cholecystectomy.  I discussed the procedure in detail.   We discussed the risks and benefits of a laparoscopic cholecystectomy and possible cholangiogram including, but not limited to bleeding, infection, injury to surrounding structures such as the intestine or liver, bile leak, retained gallstones, need to convert to an open procedure, prolonged diarrhea, blood clots such as  DVT, common bile duct injury, anesthesia risks, and possible need for additional procedures.  The likelihood of improvement in symptoms and return to the patient's normal status is good. We discussed the typical post-operative recovery course.  Edward Jolly, MD 11/18/2017, 8:05 PM

## 2017-11-19 ENCOUNTER — Encounter (HOSPITAL_COMMUNITY): Payer: Self-pay | Admitting: *Deleted

## 2017-11-19 ENCOUNTER — Inpatient Hospital Stay (HOSPITAL_COMMUNITY): Payer: BC Managed Care – PPO | Admitting: Anesthesiology

## 2017-11-19 ENCOUNTER — Encounter (HOSPITAL_COMMUNITY): Admission: EM | Disposition: A | Discharge status: Home / Self Care | Payer: Self-pay | Source: Home / Self Care

## 2017-11-19 HISTORY — PX: CHOLECYSTECTOMY: SHX55

## 2017-11-19 LAB — SURGICAL PCR SCREEN
MRSA, PCR: NEGATIVE
Staphylococcus aureus: POSITIVE — AB

## 2017-11-19 LAB — HIV ANTIBODY (ROUTINE TESTING W REFLEX): HIV Screen 4th Generation wRfx: NONREACTIVE

## 2017-11-19 SURGERY — LAPAROSCOPIC CHOLECYSTECTOMY WITH INTRAOPERATIVE CHOLANGIOGRAM
Anesthesia: General | Site: Abdomen

## 2017-11-19 MED ORDER — LIP MEDEX EX OINT
1.0000 "application " | TOPICAL_OINTMENT | Freq: Two times a day (BID) | CUTANEOUS | Status: DC
  Administered 2017-11-19 – 2017-11-21 (×4): 1 via TOPICAL
  Filled 2017-11-19 (×2): qty 7

## 2017-11-19 MED ORDER — MIDAZOLAM HCL 5 MG/5ML IJ SOLN
INTRAMUSCULAR | Status: DC | PRN
  Administered 2017-11-19: 2 mg via INTRAVENOUS

## 2017-11-19 MED ORDER — FENTANYL CITRATE (PF) 250 MCG/5ML IJ SOLN
INTRAMUSCULAR | Status: AC
  Filled 2017-11-19: qty 5

## 2017-11-19 MED ORDER — SUGAMMADEX SODIUM 200 MG/2ML IV SOLN
INTRAVENOUS | Status: DC | PRN
  Administered 2017-11-19: 200 mg via INTRAVENOUS

## 2017-11-19 MED ORDER — DIPHENHYDRAMINE HCL 25 MG PO CAPS: 25.0000 mg | ORAL_CAPSULE | Freq: Four times a day (QID) | ORAL | Status: DC | PRN

## 2017-11-19 MED ORDER — MAGNESIUM HYDROXIDE 400 MG/5ML PO SUSP: 30.0000 mL | Freq: Two times a day (BID) | ORAL | Status: DC | PRN

## 2017-11-19 MED ORDER — LACTATED RINGERS IR SOLN
Status: DC | PRN
  Administered 2017-11-19: 1000 mL

## 2017-11-19 MED ORDER — FENTANYL CITRATE (PF) 100 MCG/2ML IJ SOLN
INTRAMUSCULAR | Status: DC | PRN
  Administered 2017-11-19 (×5): 50 ug via INTRAVENOUS

## 2017-11-19 MED ORDER — METOPROLOL TARTRATE 5 MG/5ML IV SOLN: 5.0000 mg | Freq: Four times a day (QID) | INTRAVENOUS | Status: DC | PRN

## 2017-11-19 MED ORDER — CHLORHEXIDINE GLUCONATE CLOTH 2 % EX PADS
6.0000 | MEDICATED_PAD | Freq: Every day | CUTANEOUS | Status: DC
  Administered 2017-11-20 – 2017-11-21 (×2): 6 via TOPICAL

## 2017-11-19 MED ORDER — PHENYLEPHRINE HCL 10 MG/ML IJ SOLN
INTRAMUSCULAR | Status: DC | PRN
  Administered 2017-11-19 (×6): 80 ug via INTRAVENOUS

## 2017-11-19 MED ORDER — MENTHOL 3 MG MT LOZG: 1.0000 | LOZENGE | OROMUCOSAL | Status: DC | PRN

## 2017-11-19 MED ORDER — LIDOCAINE 2% (20 MG/ML) 5 ML SYRINGE
INTRAMUSCULAR | Status: AC
  Filled 2017-11-19: qty 5

## 2017-11-19 MED ORDER — METOPROLOL TARTRATE 12.5 MG HALF TABLET
12.5000 mg | ORAL_TABLET | Freq: Two times a day (BID) | ORAL | Status: DC
  Administered 2017-11-20 – 2017-11-21 (×3): 12.5 mg via ORAL
  Filled 2017-11-19 (×4): qty 1

## 2017-11-19 MED ORDER — MIDAZOLAM HCL 2 MG/2ML IJ SOLN
INTRAMUSCULAR | Status: AC
  Filled 2017-11-19: qty 2

## 2017-11-19 MED ORDER — DIPHENHYDRAMINE HCL 50 MG/ML IJ SOLN: 12.5000 mg | Freq: Four times a day (QID) | INTRAMUSCULAR | Status: DC | PRN

## 2017-11-19 MED ORDER — ACETAMINOPHEN 500 MG PO TABS
1000.0000 mg | ORAL_TABLET | Freq: Three times a day (TID) | ORAL | Status: DC
  Administered 2017-11-19 – 2017-11-21 (×6): 1000 mg via ORAL
  Filled 2017-11-19 (×6): qty 2

## 2017-11-19 MED ORDER — BUPIVACAINE-EPINEPHRINE (PF) 0.5% -1:200000 IJ SOLN
INTRAMUSCULAR | Status: AC
  Filled 2017-11-19: qty 30

## 2017-11-19 MED ORDER — BUPIVACAINE HCL (PF) 0.25 % IJ SOLN
INTRAMUSCULAR | Status: AC
  Filled 2017-11-19: qty 30

## 2017-11-19 MED ORDER — ONDANSETRON HCL 4 MG/2ML IJ SOLN
INTRAMUSCULAR | Status: DC | PRN
  Administered 2017-11-19: 4 mg via INTRAVENOUS

## 2017-11-19 MED ORDER — PROPOFOL 10 MG/ML IV BOLUS
INTRAVENOUS | Status: DC | PRN
  Administered 2017-11-19: 120 mg via INTRAVENOUS

## 2017-11-19 MED ORDER — OXYCODONE HCL 5 MG/5ML PO SOLN
5.0000 mg | Freq: Once | ORAL | Status: DC | PRN
  Filled 2017-11-19: qty 5

## 2017-11-19 MED ORDER — ONDANSETRON HCL 4 MG/2ML IJ SOLN
INTRAMUSCULAR | Status: AC
  Filled 2017-11-19: qty 2

## 2017-11-19 MED ORDER — ENOXAPARIN SODIUM 40 MG/0.4ML ~~LOC~~ SOLN
40.0000 mg | SUBCUTANEOUS | Status: DC
  Administered 2017-11-20 – 2017-11-21 (×2): 40 mg via SUBCUTANEOUS
  Filled 2017-11-19 (×2): qty 0.4

## 2017-11-19 MED ORDER — ROCURONIUM BROMIDE 10 MG/ML (PF) SYRINGE
PREFILLED_SYRINGE | INTRAVENOUS | Status: AC
  Filled 2017-11-19: qty 5

## 2017-11-19 MED ORDER — AMLODIPINE BESYLATE 5 MG PO TABS
5.0000 mg | ORAL_TABLET | Freq: Every day | ORAL | Status: DC
  Administered 2017-11-19 – 2017-11-21 (×3): 5 mg via ORAL
  Filled 2017-11-19 (×3): qty 1

## 2017-11-19 MED ORDER — POTASSIUM CHLORIDE IN NACL 20-0.9 MEQ/L-% IV SOLN
INTRAVENOUS | Status: DC
  Filled 2017-11-19: qty 1000

## 2017-11-19 MED ORDER — SODIUM CHLORIDE 0.9 % IV SOLN
25.0000 mg | Freq: Four times a day (QID) | INTRAVENOUS | Status: DC | PRN
  Filled 2017-11-19: qty 1

## 2017-11-19 MED ORDER — ENALAPRILAT 1.25 MG/ML IV SOLN
0.6250 mg | Freq: Four times a day (QID) | INTRAVENOUS | Status: DC | PRN
  Filled 2017-11-19: qty 1

## 2017-11-19 MED ORDER — IOPAMIDOL (ISOVUE-300) INJECTION 61%
INTRAVENOUS | Status: AC
  Filled 2017-11-19: qty 50

## 2017-11-19 MED ORDER — METHOCARBAMOL 1000 MG/10ML IJ SOLN
1000.0000 mg | Freq: Four times a day (QID) | INTRAVENOUS | Status: DC | PRN
  Filled 2017-11-19: qty 10

## 2017-11-19 MED ORDER — MUPIROCIN 2 % EX OINT
1.0000 "application " | TOPICAL_OINTMENT | Freq: Two times a day (BID) | CUTANEOUS | Status: DC
  Administered 2017-11-19 – 2017-11-21 (×4): 1 via NASAL
  Filled 2017-11-19 (×2): qty 22

## 2017-11-19 MED ORDER — MAGIC MOUTHWASH
15.0000 mL | Freq: Four times a day (QID) | ORAL | Status: DC | PRN
  Filled 2017-11-19: qty 15

## 2017-11-19 MED ORDER — GABAPENTIN 300 MG PO CAPS
300.0000 mg | ORAL_CAPSULE | Freq: Two times a day (BID) | ORAL | Status: DC
  Administered 2017-11-19 – 2017-11-21 (×4): 300 mg via ORAL
  Filled 2017-11-19 (×4): qty 1

## 2017-11-19 MED ORDER — HYDROCORTISONE 1 % EX CREA
1.0000 | TOPICAL_CREAM | Freq: Three times a day (TID) | CUTANEOUS | Status: DC | PRN
Start: 2017-11-19 — End: 2017-11-21

## 2017-11-19 MED ORDER — METHOCARBAMOL 1000 MG/10ML IJ SOLN: 1000.0000 mg | Freq: Four times a day (QID) | INTRAVENOUS | Status: DC | PRN

## 2017-11-19 MED ORDER — METHOCARBAMOL 500 MG PO TABS
1000.0000 mg | ORAL_TABLET | Freq: Four times a day (QID) | ORAL | Status: DC | PRN
  Administered 2017-11-19 – 2017-11-21 (×5): 1000 mg via ORAL
  Filled 2017-11-19 (×5): qty 2

## 2017-11-19 MED ORDER — METHOCARBAMOL 500 MG PO TABS: 1000.0000 mg | ORAL_TABLET | Freq: Four times a day (QID) | ORAL | Status: DC | PRN

## 2017-11-19 MED ORDER — OXYCODONE HCL 5 MG PO TABS: 5.0000 mg | ORAL_TABLET | Freq: Once | ORAL | Status: DC | PRN

## 2017-11-19 MED ORDER — LORATADINE 10 MG PO TABS
10.0000 mg | ORAL_TABLET | Freq: Every day | ORAL | Status: DC
  Administered 2017-11-20 – 2017-11-21 (×2): 10 mg via ORAL
  Filled 2017-11-19 (×2): qty 1

## 2017-11-19 MED ORDER — DEXAMETHASONE SODIUM PHOSPHATE 10 MG/ML IJ SOLN
INTRAMUSCULAR | Status: AC
  Filled 2017-11-19: qty 1

## 2017-11-19 MED ORDER — OXYCODONE HCL 5 MG PO TABS
5.0000 mg | ORAL_TABLET | ORAL | Status: DC | PRN
  Administered 2017-11-19 – 2017-11-21 (×3): 10 mg via ORAL
  Filled 2017-11-19 (×3): qty 2

## 2017-11-19 MED ORDER — SIMETHICONE 40 MG/0.6ML PO SUSP
40.0000 mg | Freq: Four times a day (QID) | ORAL | Status: DC | PRN
  Filled 2017-11-19: qty 0.6

## 2017-11-19 MED ORDER — ENSURE SURGERY PO LIQD
237.0000 mL | Freq: Two times a day (BID) | ORAL | Status: DC
  Administered 2017-11-19 – 2017-11-21 (×4): 237 mL via ORAL
  Filled 2017-11-19 (×5): qty 237

## 2017-11-19 MED ORDER — BISACODYL 10 MG RE SUPP: 10.0000 mg | Freq: Two times a day (BID) | RECTAL | Status: DC | PRN

## 2017-11-19 MED ORDER — LACTATED RINGERS IV SOLN
INTRAVENOUS | Status: DC | PRN
  Administered 2017-11-19 (×2): via INTRAVENOUS

## 2017-11-19 MED ORDER — METOCLOPRAMIDE HCL 5 MG/ML IJ SOLN: 10.0000 mg | Freq: Four times a day (QID) | INTRAMUSCULAR | Status: DC | PRN

## 2017-11-19 MED ORDER — HYDROCORTISONE 2.5 % RE CREA: 1.0000 "application " | TOPICAL_CREAM | Freq: Four times a day (QID) | RECTAL | Status: DC | PRN

## 2017-11-19 MED ORDER — GUAIFENESIN-DM 100-10 MG/5ML PO SYRP: 10.0000 mL | ORAL_SOLUTION | ORAL | Status: DC | PRN

## 2017-11-19 MED ORDER — IBUPROFEN 800 MG PO TABS
800.0000 mg | ORAL_TABLET | Freq: Four times a day (QID) | ORAL | Status: DC | PRN
  Administered 2017-11-20 (×2): 800 mg via ORAL
  Filled 2017-11-19 (×2): qty 1

## 2017-11-19 MED ORDER — CLONAZEPAM 0.5 MG PO TABS
0.5000 mg | ORAL_TABLET | Freq: Once | ORAL | Status: AC
  Administered 2017-11-19: 0.5 mg via ORAL

## 2017-11-19 MED ORDER — PANTOPRAZOLE SODIUM 40 MG PO TBEC
40.0000 mg | DELAYED_RELEASE_TABLET | Freq: Every day | ORAL | Status: DC
  Administered 2017-11-19 – 2017-11-21 (×3): 40 mg via ORAL
  Filled 2017-11-19 (×3): qty 1

## 2017-11-19 MED ORDER — ALUM & MAG HYDROXIDE-SIMETH 200-200-20 MG/5ML PO SUSP: 30.0000 mL | Freq: Four times a day (QID) | ORAL | Status: DC | PRN

## 2017-11-19 MED ORDER — LIDOCAINE 2% (20 MG/ML) 5 ML SYRINGE
INTRAMUSCULAR | Status: DC | PRN
  Administered 2017-11-19: 60 mg via INTRAVENOUS

## 2017-11-19 MED ORDER — PROCHLORPERAZINE EDISYLATE 5 MG/ML IJ SOLN: 5.0000 mg | INTRAMUSCULAR | Status: DC | PRN

## 2017-11-19 MED ORDER — PHENOL 1.4 % MT LIQD
1.0000 | OROMUCOSAL | Status: DC | PRN
  Filled 2017-11-19: qty 177

## 2017-11-19 MED ORDER — 0.9 % SODIUM CHLORIDE (POUR BTL) OPTIME
TOPICAL | Status: DC | PRN
  Administered 2017-11-19: 1000 mL

## 2017-11-19 MED ORDER — BUPIVACAINE-EPINEPHRINE 0.5% -1:200000 IJ SOLN
INTRAMUSCULAR | Status: DC | PRN
  Administered 2017-11-19: 15 mL

## 2017-11-19 MED ORDER — PROPOFOL 10 MG/ML IV BOLUS
INTRAVENOUS | Status: AC
  Filled 2017-11-19: qty 40

## 2017-11-19 MED ORDER — HYDROMORPHONE HCL 1 MG/ML IJ SOLN
0.5000 mg | INTRAMUSCULAR | Status: DC | PRN
  Administered 2017-11-19 – 2017-11-21 (×4): 1 mg via INTRAVENOUS
  Administered 2017-11-21: 2 mg via INTRAVENOUS
  Filled 2017-11-19 (×4): qty 1
  Filled 2017-11-19: qty 2
  Filled 2017-11-19: qty 1

## 2017-11-19 MED ORDER — ROCURONIUM BROMIDE 50 MG/5ML IV SOSY
PREFILLED_SYRINGE | INTRAVENOUS | Status: DC | PRN
  Administered 2017-11-19 (×2): 10 mg via INTRAVENOUS
  Administered 2017-11-19: 50 mg via INTRAVENOUS

## 2017-11-19 MED ORDER — FENTANYL CITRATE (PF) 100 MCG/2ML IJ SOLN: 25.0000 ug | INTRAMUSCULAR | Status: DC | PRN

## 2017-11-19 MED ORDER — DEXAMETHASONE SODIUM PHOSPHATE 10 MG/ML IJ SOLN
INTRAMUSCULAR | Status: DC | PRN
  Administered 2017-11-19: 10 mg via INTRAVENOUS

## 2017-11-19 SURGICAL SUPPLY — 44 items
ADH SKN CLS APL DERMABOND .7 (GAUZE/BANDAGES/DRESSINGS) ×1
APPLIER CLIP ROT 10 11.4 M/L (STAPLE) ×2
APR CLP MED LRG 11.4X10 (STAPLE) ×1
BAG SPEC RTRVL 10 TROC 200 (ENDOMECHANICALS) ×1
BAG SPEC RTRVL LRG 6X4 10 (ENDOMECHANICALS)
BIOPATCH BLUE 3/4IN DISK W/1.5 (GAUZE/BANDAGES/DRESSINGS) ×1 IMPLANT
CABLE HIGH FREQUENCY MONO STRZ (ELECTRODE) ×2 IMPLANT
CATH REDDICK CHOLANGI 4FR 50CM (CATHETERS) IMPLANT
CHLORAPREP W/TINT 26ML (MISCELLANEOUS) ×2 IMPLANT
CLIP APPLIE ROT 10 11.4 M/L (STAPLE) ×1 IMPLANT
COVER MAYO STAND STRL (DRAPES) ×2 IMPLANT
COVER SURGICAL LIGHT HANDLE (MISCELLANEOUS) ×2 IMPLANT
DECANTER SPIKE VIAL GLASS SM (MISCELLANEOUS) ×2 IMPLANT
DERMABOND ADVANCED (GAUZE/BANDAGES/DRESSINGS) ×1
DERMABOND ADVANCED .7 DNX12 (GAUZE/BANDAGES/DRESSINGS) ×1 IMPLANT
DRAIN CHANNEL RND F F (WOUND CARE) ×1 IMPLANT
DRAPE C-ARM 42X120 X-RAY (DRAPES) ×2 IMPLANT
DRSG TEGADERM 4X4.75 (GAUZE/BANDAGES/DRESSINGS) ×1 IMPLANT
ELECT REM PT RETURN 15FT ADLT (MISCELLANEOUS) ×2 IMPLANT
EVACUATOR SILICONE 100CC (DRAIN) ×1 IMPLANT
GLOVE BIOGEL PI IND STRL 7.5 (GLOVE) ×1 IMPLANT
GLOVE BIOGEL PI INDICATOR 7.5 (GLOVE) ×1
GLOVE ECLIPSE 7.5 STRL STRAW (GLOVE) ×2 IMPLANT
GOWN STRL REUS W/TWL XL LVL3 (GOWN DISPOSABLE) ×6 IMPLANT
HEMOSTAT SNOW SURGICEL 2X4 (HEMOSTASIS) IMPLANT
HEMOSTAT SURGICEL 4X8 (HEMOSTASIS) IMPLANT
KIT BASIN OR (CUSTOM PROCEDURE TRAY) ×2 IMPLANT
POUCH RETRIEVAL ECOSAC 10 (ENDOMECHANICALS) IMPLANT
POUCH RETRIEVAL ECOSAC 10MM (ENDOMECHANICALS) ×1
POUCH SPECIMEN RETRIEVAL 10MM (ENDOMECHANICALS) IMPLANT
SCISSORS LAP 5X35 DISP (ENDOMECHANICALS) ×2 IMPLANT
SET CHOLANGIOGRAPH MIX (MISCELLANEOUS) ×1 IMPLANT
SET IRRIG TUBING LAPAROSCOPIC (IRRIGATION / IRRIGATOR) ×2 IMPLANT
SLEEVE XCEL OPT CAN 5 100 (ENDOMECHANICALS) ×2 IMPLANT
SPONGE DRAIN TRACH 4X4 STRL 2S (GAUZE/BANDAGES/DRESSINGS) ×1 IMPLANT
SUT ETHILON 3 0 PS 1 (SUTURE) ×1 IMPLANT
SUT MNCRL AB 4-0 PS2 18 (SUTURE) ×2 IMPLANT
TAPE CLOTH SURG 4X10 WHT LF (GAUZE/BANDAGES/DRESSINGS) ×1 IMPLANT
TOWEL OR 17X26 10 PK STRL BLUE (TOWEL DISPOSABLE) ×2 IMPLANT
TRAY LAPAROSCOPIC (CUSTOM PROCEDURE TRAY) ×2 IMPLANT
TROCAR BLADELESS OPT 5 100 (ENDOMECHANICALS) ×2 IMPLANT
TROCAR XCEL BLUNT TIP 100MML (ENDOMECHANICALS) ×2 IMPLANT
TROCAR XCEL NON-BLD 11X100MML (ENDOMECHANICALS) ×2 IMPLANT
TUBING INSUF HEATED (TUBING) ×2 IMPLANT

## 2017-11-19 NOTE — Op Note (Signed)
Preoperative Diagnosis: Cholelithiasis with acute and chronic cholecystitis  Postoprative Diagnosis: Same with acute gangrenous cholecystitis  Procedure: Procedure(s): LAPAROSCOPIC CHOLECYSTECTOMY   Surgeon: Excell Seltzer T   Assistants: None  Anesthesia:  General endotracheal anesthesia  Indications: Patient is a 52 year old male with known gallstones and previous pain who presents with several days of acute right upper quadrant abdominal pain.  He has leukocytosis and tenderness.  Ultrasound shows a gallbladder densely packed with stones and sludge.  After extensive preoperative discussion detailed elsewhere we have elected to proceed with urgent laparoscopic cholecystectomy.    Procedure Detail: Patient had received preoperative broad-spectrum IV antibiotics.  He was taken to the operating room, placed in the supine position on the operating table, and general endotracheal anesthesia induced.  The abdomen was widely sterilely prepped and draped.  Patient timeout was performed and correct procedure verified.  Access was obtained with a 1-1/2 cm incision at the umbilicus with an open Hassan technique through mattress suture of 0 Vicryl and pneumoperitoneum established.  Under direct vision an 11 mm trocar was placed subxiphoid and 2 5 mm trochars along the right subcostal margin.  The omentum was densely adherent up to the gallbladder.  Using careful blunt and some cautery dissection the gallbladder was exposed peeling the omentum down revealing a tense chronically and acutely inflamed gallbladder with patchy areas of gangrene.  The gallbladder was aspirated and decompressed somewhat.  The fundus was grasped and elevated.  Further blunt dissection was used to take the omentum down off of the infundibulum of the gallbladder which was grasped and retracted inferolaterally.  The tissue was very poor and there was some leakage of bile and stones from each area that the gallbladder was grasped.   This was all suctioned and irrigated and stones all carefully removed with the stone retriever.  The infundibulum was able to be retracted inferolaterally and fibrofatty tissue was dissected down off the distal gallbladder toward the porta hepatis.  Using careful blunt dissection and some slow hook cautery dissection the distal gallbladder and Calot's triangle was dissected.  The distal gallbladder was identified and was dissected off the cholecystic plate.  The cystic duct gallbladder junction was identified and dissected 360 degrees.  The cystic artery was identified above this coursing up onto the gallbladder wall.  The cystic duct was somewhat thick and contains sludge and small stones which were milked back toward the gallbladder.  The tissue was edematous and somewhat thickened but at this point I had a reasonably good critical view with 2 structures and the distal gallbladder dissected off the cholecystic plate.  The cystic artery was doubly clipped proximally clipped distally and divided.  I do not feel that the cystic duct was of adequate quality to perform a cholangiogram without compromising the closure.  It was still quite edematous and thick.  The cystic duct was clipped at the gallbladder junction and then divided.  The tissue was thick but the lumen was small.  This was closed with a 0 PDS Endoloop.  This appeared secure.  The gallbladder was then tediously dissected free from its bed.  There was no real plane here with some necrotic and granulation tissue posteriorly.  With tedious dissection the gallbladder was dissected free from the liver and placed in an eco-catch bag.  This was then brought up to the umbilicus and the gallbladder and multiple stones and sludge were removed piecemeal.  The right upper quadrant was then thoroughly irrigated.  There was some oozing from the  gallbladder bed that was controlled with cautery.  Thorough irrigation completely cleared all bile staining and several small  stones were removed from Morison's pouch and I did not see any further stones.  A Surgicel snow patch was placed in the gallbladder bed with complete hemostasis.  A 19 Blake drain was brought out through the lateral trocar site and placed in the gallbladder fossa and Morison's pouch.  There was no evidence of bleeding or injury or other problems this point.  All CO2 was evacuated and trochars removed and the mattress suture secured to the umbilicus.  Skin incisions were closed with subcuticular Monocryl and Dermabond.  Sponge needle and instrument counts were correct.    Findings: Acute gangrenous cholecystitis and chronic cholecystitis  Estimated Blood Loss:  less than 100 mL         Drains: 19 round Blake and subhepatic space  Blood Given: none          Specimens: Gallbladder and contents        Complications:  * No complications entered in OR log *         Disposition: PACU - hemodynamically stable.         Condition: stable

## 2017-11-19 NOTE — Anesthesia Postprocedure Evaluation (Signed)
Anesthesia Post Note  Patient: John Costa  Procedure(s) Performed: LAPAROSCOPIC CHOLECYSTECTOMY (N/A Abdomen)     Patient location during evaluation: PACU Anesthesia Type: General Level of consciousness: awake and alert Pain management: pain level controlled Vital Signs Assessment: post-procedure vital signs reviewed and stable Respiratory status: spontaneous breathing, nonlabored ventilation, respiratory function stable and patient connected to nasal cannula oxygen Cardiovascular status: blood pressure returned to baseline and stable Postop Assessment: no apparent nausea or vomiting Anesthetic complications: no    Last Vitals:  Vitals:   11/19/17 1137 11/19/17 1245  BP: 120/73 127/68  Pulse: 94 86  Resp: 18 18  Temp: 37.7 C 36.9 C  SpO2: 96% 98%    Last Pain:  Vitals:   11/19/17 1245  TempSrc: Oral  PainSc:                  Rajat Staver

## 2017-11-19 NOTE — Transfer of Care (Signed)
Immediate Anesthesia Transfer of Care Note  Patient: John Costa  Procedure(s) Performed: LAPAROSCOPIC CHOLECYSTECTOMY (N/A Abdomen)  Patient Location: PACU  Anesthesia Type:General  Level of Consciousness: drowsy and patient cooperative  Airway & Oxygen Therapy: Patient Spontanous Breathing and Patient connected to face mask oxygen  Post-op Assessment: Report given to RN and Post -op Vital signs reviewed and stable  Post vital signs: Reviewed and stable  Last Vitals:  Vitals:   11/18/17 2253 11/19/17 0537  BP: 139/75 130/72  Pulse: 86 93  Resp: 18 15  Temp: 37.1 C 37.6 C  SpO2: 100% 100%    Last Pain:  Vitals:   11/19/17 0607  TempSrc:   PainSc: 6       Patients Stated Pain Goal: 2 (56/81/27 5170)  Complications: No apparent anesthesia complications

## 2017-11-19 NOTE — Anesthesia Procedure Notes (Signed)
Procedure Name: Intubation Date/Time: 11/19/2017 7:46 AM Performed by: West Pugh, CRNA Pre-anesthesia Checklist: Patient identified, Emergency Drugs available, Suction available, Patient being monitored and Timeout performed Patient Re-evaluated:Patient Re-evaluated prior to induction Oxygen Delivery Method: Circle system utilized Preoxygenation: Pre-oxygenation with 100% oxygen Induction Type: IV induction Ventilation: Two handed mask ventilation required and Oral airway inserted - appropriate to patient size Laryngoscope Size: Mac and 4 Grade View: Grade I Tube type: Oral Tube size: 7.5 mm Number of attempts: 1 Airway Equipment and Method: Stylet Placement Confirmation: ETT inserted through vocal cords under direct vision,  positive ETCO2,  CO2 detector and breath sounds checked- equal and bilateral Secured at: 22 cm Tube secured with: Tape Dental Injury: Teeth and Oropharynx as per pre-operative assessment

## 2017-11-19 NOTE — Anesthesia Preprocedure Evaluation (Signed)
Anesthesia Evaluation  Patient identified by MRN, date of birth, ID band Patient awake    Reviewed: Allergy & Precautions, NPO status , Patient's Chart, lab work & pertinent test results  History of Anesthesia Complications Negative for: history of anesthetic complications  Airway Mallampati: I  TM Distance: >3 FB Neck ROM: Full    Dental  (+) Teeth Intact   Pulmonary neg pulmonary ROS,    breath sounds clear to auscultation       Cardiovascular hypertension, Pt. on medications (-) angina+ Peripheral Vascular Disease  (-) Past MI and (-) CHF  Rhythm:Regular     Neuro/Psych  Headaches, PSYCHIATRIC DISORDERS Anxiety Depression    GI/Hepatic GERD  Medicated and Controlled,Cholecystitis   Endo/Other  negative endocrine ROS  Renal/GU Renal disease     Musculoskeletal negative musculoskeletal ROS (+)   Abdominal   Peds  Hematology negative hematology ROS (+)   Anesthesia Other Findings   Reproductive/Obstetrics                             Anesthesia Physical Anesthesia Plan  ASA: II  Anesthesia Plan: General   Post-op Pain Management:    Induction: Intravenous  PONV Risk Score and Plan: 2 and Ondansetron and Dexamethasone  Airway Management Planned: Oral ETT  Additional Equipment: None  Intra-op Plan:   Post-operative Plan: Extubation in OR  Informed Consent: I have reviewed the patients History and Physical, chart, labs and discussed the procedure including the risks, benefits and alternatives for the proposed anesthesia with the patient or authorized representative who has indicated his/her understanding and acceptance.   Dental advisory given  Plan Discussed with: CRNA and Surgeon  Anesthesia Plan Comments:         Anesthesia Quick Evaluation

## 2017-11-20 ENCOUNTER — Encounter (HOSPITAL_COMMUNITY): Payer: Self-pay | Admitting: General Surgery

## 2017-11-20 LAB — COMPREHENSIVE METABOLIC PANEL
ALT: 75 U/L — ABNORMAL HIGH (ref 17–63)
AST: 72 U/L — ABNORMAL HIGH (ref 15–41)
Albumin: 3.6 g/dL (ref 3.5–5.0)
Alkaline Phosphatase: 85 U/L (ref 38–126)
Anion gap: 8 (ref 5–15)
BUN: 10 mg/dL (ref 6–20)
CO2: 30 mmol/L (ref 22–32)
Calcium: 9 mg/dL (ref 8.9–10.3)
Chloride: 101 mmol/L (ref 101–111)
Creatinine, Ser: 1.11 mg/dL (ref 0.61–1.24)
GFR calc Af Amer: >60 mL/min (ref >60)
GFR calc non Af Amer: >60 mL/min (ref >60)
Glucose, Bld: 130 mg/dL — ABNORMAL HIGH (ref 65–99)
Potassium: 3.9 mmol/L (ref 3.5–5.1)
Sodium: 139 mmol/L (ref 135–145)
Total Bilirubin: 4.1 mg/dL — ABNORMAL HIGH (ref 0.3–1.2)
Total Protein: 6.8 g/dL (ref 6.5–8.1)

## 2017-11-20 LAB — CBC
HCT: 36.8 % — ABNORMAL LOW (ref 39.0–52.0)
Hemoglobin: 12.2 g/dL — ABNORMAL LOW (ref 13.0–17.0)
MCH: 29.5 pg (ref 26.0–34.0)
MCHC: 33.2 g/dL (ref 30.0–36.0)
MCV: 88.9 fL (ref 78.0–100.0)
Platelets: 189 10*3/uL (ref 150–400)
RBC: 4.14 MIL/uL — ABNORMAL LOW (ref 4.22–5.81)
RDW: 15.3 % (ref 11.5–15.5)
WBC: 9.7 10*3/uL (ref 4.0–10.5)

## 2017-11-20 LAB — HEPATIC FUNCTION PANEL
ALT: 74 U/L — ABNORMAL HIGH (ref 17–63)
AST: 72 U/L — ABNORMAL HIGH (ref 15–41)
Albumin: 3.6 g/dL (ref 3.5–5.0)
Alkaline Phosphatase: 88 U/L (ref 38–126)
Bilirubin, Direct: 1 mg/dL — ABNORMAL HIGH (ref 0.1–0.5)
Indirect Bilirubin: 2.9 mg/dL — ABNORMAL HIGH (ref 0.3–0.9)
Total Bilirubin: 3.9 mg/dL — ABNORMAL HIGH (ref 0.3–1.2)
Total Protein: 6.6 g/dL (ref 6.5–8.1)

## 2017-11-20 NOTE — Progress Notes (Signed)
[facility] Progress Note  1 Day Post-Op  Subjective: CC: abdominal soreness Patient having some abdominal pain, trying to control pain with ibuprofen and muscle relaxant because he does not want to take oxycodone. Tolerating puree diet without n/v. No flatus yet.  UOP good. VSS.   Objective: Vital signs in last 24 hours: Temp:  [98 F (36.7 C)-99.9 F (37.7 C)] 98 F (36.7 C) (03/04 0520) Pulse Rate:  [80-112] 93 (03/04 0520) Resp:  [14-21] 20 (03/04 0520) BP: (117-161)/(68-107) 125/79 (03/04 0520) SpO2:  [95 %-100 %] 100 % (03/04 0520) Last BM Date: 11/18/17  Intake/Output from previous day: 03/03 0701 - 03/04 0700 In: 2776.7 [P.O.:360; I.V.:2416.7] Out: 2435 [Urine:2250; Drains:135; Blood:50] Intake/Output this shift: No intake/output data recorded.  PE: Gen:  Alert, NAD, pleasant Card:  Regular rate and rhythm, pedal pulses 2+ BL Pulm:  Normal effort, clear to auscultation bilaterally Abd: Soft, appropriately tender, non-distended, bowel sounds present, no HSM, incisions C/D/I, drain with serosanguinous drainage Skin: warm and dry, no rashes  Psych: A&Ox3   Lab Results:  Recent Labs    11/18/17 1630 11/20/17 0522  WBC 13.7* 9.7  HGB 15.1 12.2*  HCT 43.6 36.8*  PLT 241 189   BMET Recent Labs    11/18/17 1606 11/20/17 0522  NA 139 139  K 4.2 3.9  CL 104 101  CO2 24 30  GLUCOSE 98 130*  BUN 12 10  CREATININE 0.92 1.11  CALCIUM 10.2 9.0   PT/INR No results for input(s): LABPROT, INR in the last 72 hours. CMP     Component Value Date/Time   NA 139 11/20/2017 0522   K 3.9 11/20/2017 0522   CL 101 11/20/2017 0522   CO2 30 11/20/2017 0522   GLUCOSE 130 (H) 11/20/2017 0522   BUN 10 11/20/2017 0522   CREATININE 1.11 11/20/2017 0522   CALCIUM 9.0 11/20/2017 0522   PROT 6.8 11/20/2017 0522   ALBUMIN 3.6 11/20/2017 0522   AST 72 (H) 11/20/2017 0522   ALT 75 (H) 11/20/2017 0522   ALKPHOS 85 11/20/2017 0522   BILITOT 4.1 (H) 11/20/2017  0522   GFRNONAA >60 11/20/2017 0522   GFRAA >60 11/20/2017 0522   Lipase     Component Value Date/Time   LIPASE 30 11/18/2017 1606       Studies/Results: US Abdomen Complete  Result Date: 11/18/2017 CLINICAL DATA:  Right upper quadrant pain.  History of gallstones. EXAM: ABDOMEN ULTRASOUND COMPLETE COMPARISON:  CT abdomen pelvis 08/29/2015; ultrasound abdomen 09/22/2015. FINDINGS: Gallbladder: The gallbladder is filled with echogenic material. No gallbladder wall thickening. No sonographic Murphy sign. Common bile duct: Diameter: 4 mm Liver: No focal lesion identified. Within normal limits in parenchymal echogenicity. Portal vein is patent on color Doppler imaging with normal direction of blood flow towards the liver. IVC: No abnormality visualized. Pancreas: Visualized portion unremarkable. Spleen: Size and appearance within normal limits. Right Kidney: Length: 12.4 cm. Echogenicity within normal limits. No mass or hydronephrosis visualized. Left Kidney: Length: 10.0 cm. Echogenicity within normal limits. No mass or hydronephrosis visualized. Possible stone lower pole left kidney. Abdominal aorta: No aneurysm visualized. Other findings: None. IMPRESSION: 1. The gallbladder is filled with echogenic material most compatible with stones/sludge. No secondary signs to suggest acute cholecystitis. No CBD ductal dilatation. In the nonacute setting, consider further evaluation with CT or MRI to exclude the possibility of gallbladder mass. 2. No hydronephrosis. Electronically Signed   By: Lovey Newcomer M.D.   On: 11/18/2017 20:01  Anti-infectives: Anti-infectives (From admission, onward)   Start     Dose/Rate Route Frequency Ordered Stop   11/18/17 2245  piperacillin-tazobactam (ZOSYN) IVPB 3.375 g     3.375 g 12.5 mL/hr over 240 Minutes Intravenous Every 8 hours 11/18/17 2236         Assessment/Plan HTN Migraines GERD Gout Depression/Anxiety Hx of Gilbert's syndrome - pt states he only has  flares with fevers   Acute gangrenous cholecystitis S/P lap chole 11/19/17 Dr. Excell Seltzer - POD#1 - Tbili 4.1, AST/ALT mildly elevated - recheck LFTs this afternoon, if bili increasing may need MRCP - drain with 135 cc out - patient will likely be discharge with drain - mobilize, OOB, IS - patient may shower  FEN: advance to reg diet VTE: SCDs, lovenox ID: IV zosyn 3/3 >>  LOS: 2 days    Brigid Re , [facility] Surgery 11/20/2017, 9:06 AM Pager: [phone] Consults: [phone] Mon-Fri 7:00 am-4:30 pm Sat-Sun 7:00 am-11:30 am

## 2017-11-21 LAB — COMPREHENSIVE METABOLIC PANEL
ALT: 53 U/L (ref 17–63)
AST: 33 U/L (ref 15–41)
Albumin: 3.2 g/dL — ABNORMAL LOW (ref 3.5–5.0)
Alkaline Phosphatase: 75 U/L (ref 38–126)
Anion gap: 7 (ref 5–15)
BUN: 7 mg/dL (ref 6–20)
CO2: 30 mmol/L (ref 22–32)
Calcium: 8.6 mg/dL — ABNORMAL LOW (ref 8.9–10.3)
Chloride: 103 mmol/L (ref 101–111)
Creatinine, Ser: 0.97 mg/dL (ref 0.61–1.24)
GFR calc Af Amer: >60 mL/min (ref >60)
GFR calc non Af Amer: >60 mL/min (ref >60)
Glucose, Bld: 184 mg/dL — ABNORMAL HIGH (ref 65–99)
Potassium: 3.8 mmol/L (ref 3.5–5.1)
Sodium: 140 mmol/L (ref 135–145)
Total Bilirubin: 1.8 mg/dL — ABNORMAL HIGH (ref 0.3–1.2)
Total Protein: 6.2 g/dL — ABNORMAL LOW (ref 6.5–8.1)

## 2017-11-21 MED ORDER — DOCUSATE SODIUM 100 MG PO CAPS: 100.0000 mg | ORAL_CAPSULE | Freq: Every day | ORAL | 2 refills | Status: AC | PRN

## 2017-11-21 MED ORDER — ACETAMINOPHEN 500 MG PO TABS: 1000.0000 mg | ORAL_TABLET | Freq: Three times a day (TID) | ORAL | 0 refills | Status: AC

## 2017-11-21 MED ORDER — AMOXICILLIN-POT CLAVULANATE 875-125 MG PO TABS: 1.0000 | ORAL_TABLET | Freq: Two times a day (BID) | ORAL | 0 refills | Status: AC

## 2017-11-21 MED ORDER — HYDRALAZINE HCL 20 MG/ML IJ SOLN: 10.0000 mg | INTRAMUSCULAR | Status: DC | PRN

## 2017-11-21 MED ORDER — ALUM & MAG HYDROXIDE-SIMETH 200-200-20 MG/5ML PO SUSP: 30.0000 mL | Freq: Four times a day (QID) | ORAL | Status: DC | PRN

## 2017-11-21 MED ORDER — HYDROCORTISONE 1 % EX CREA: 1.0000 "application " | TOPICAL_CREAM | Freq: Three times a day (TID) | CUTANEOUS | Status: DC | PRN

## 2017-11-21 MED ORDER — TRAMADOL HCL 50 MG PO TABS: 50.0000 mg | ORAL_TABLET | Freq: Four times a day (QID) | ORAL | 0 refills | Status: DC | PRN

## 2017-11-21 MED ORDER — OXYCODONE HCL 5 MG PO TABS: 5.0000 mg | ORAL_TABLET | ORAL | Status: DC | PRN

## 2017-11-21 MED ORDER — OXYCODONE HCL 5 MG PO TABS: 5.0000 mg | ORAL_TABLET | Freq: Four times a day (QID) | ORAL | 0 refills | Status: DC | PRN

## 2017-11-21 MED ORDER — METHOCARBAMOL 500 MG PO TABS: 1000.0000 mg | ORAL_TABLET | Freq: Three times a day (TID) | ORAL | 0 refills | Status: DC | PRN

## 2017-11-21 MED ORDER — MAGNESIUM HYDROXIDE 400 MG/5ML PO SUSP: 30.0000 mL | Freq: Two times a day (BID) | ORAL | Status: DC | PRN

## 2017-11-21 MED ORDER — METOCLOPRAMIDE HCL 5 MG/ML IJ SOLN: 10.0000 mg | Freq: Four times a day (QID) | INTRAMUSCULAR | Status: DC | PRN

## 2017-11-21 NOTE — Discharge Summary (Signed)
[facility] Discharge Summary   Patient ID: John Costa MRN: [id number] DOB/AGE: Jul 04, 1966 52 y.o.  Admit date: 11/18/2017 Discharge date: 11/21/2017  Admitting Diagnosis: Acute cholecystitis  Discharge Diagnosis Patient Active Problem List   Diagnosis Date Noted  . Gangrenous cholecystitis s/p lap cholecystectomy 11/19/2017 11/18/2017  . Hypertension 05/21/2017  . History of adenomatous polyp of colon 12/25/2015  . Panic attacks 11/26/2014  . Allergic rhinitis 11/26/2014  . Skin lesion 11/26/2014  . Depression 03/14/2011  . 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 Nephrolithiasis 09/19/2010  . Pityriasis rosea 01/21/2008  . Gout 06/12/2007  . Gilbert's disease 06/12/2007    Consultants None  Imaging: No results found.  Procedures Dr. Excell Seltzer (11/19/17) - Laparoscopic Cholecystectomy   Hospital Course:  Patient is a 52 year old male who presented to [facility] with severe RUQ pain.  Workup showed cholelithiasis and clinically patient was felt to have acute cholecystitis.  Patient was admitted and underwent procedure listed above.  Tolerated procedure well and was transferred to the floor.  Diet was advanced as tolerated.  On POD#2, the patient was voiding well, tolerating diet, ambulating well, pain well controlled, vital signs stable, incisions c/d/i and felt stable for discharge home.  Patient will follow up in our office in 2 weeks and knows to call with questions or concerns.  He will call to confirm appointment date/time.    Physical Exam: General:  Alert, NAD, pleasant, comfortable Abd:  Soft, ND, mild tenderness, incisions C/D/I, drain with minimal sanguinous drainage   I have personally looked this patient up in the [place] Controlled Substance Database and reviewed their medications.  Allergies as of 11/21/2017      Reactions   Cheese Anaphylaxis   Vicodin [hydrocodone-acetaminophen] Other (See Comments)   vicodin gives him a headache       Medication List    TAKE these medications   acetaminophen 500 MG tablet Commonly known as:  TYLENOL Take 2 tablets (1,000 mg total) by mouth 3 (three) times daily.   allopurinol 100 MG tablet Commonly known as:  ZYLOPRIM TAKE 1 TABLET BY MOUTH EVERY DAY   amLODipine 5 MG tablet Commonly known as:  NORVASC Take 1 tablet (5 mg total) by mouth daily.   amoxicillin-clavulanate 875-125 MG tablet Commonly known as:  AUGMENTIN Take 1 tablet by mouth every 12 (twelve) hours for 5 days.   CALCIUM & MAGNESIUM CARBONATES PO Take 1 tablet by mouth daily.   CLARITIN-D 24 HOUR PO Take 1 tablet by mouth every Monday, Wednesday, and Friday.   clonazePAM 0.5 MG tablet Commonly known as:  KLONOPIN Take 1 tablet (0.5 mg total) by mouth 2 (two) times daily as needed for anxiety.   docusate sodium 100 MG capsule Commonly known as:  COLACE Take 1 capsule (100 mg total) by mouth daily as needed.   ibuprofen 200 MG tablet Commonly known as:  ADVIL,MOTRIN Take 800 mg by mouth every 6 (six) hours as needed for moderate pain. Reported on 11/19/2015   methocarbamol 500 MG tablet Commonly known as:  ROBAXIN Take 2 tablets (1,000 mg total) by mouth every 8 (eight) hours as needed for muscle spasms.   ONE-A-DAY 50 PLUS PO Take 1 tablet by mouth.   oxyCODONE 5 MG immediate release tablet Commonly known as:  Oxy IR/ROXICODONE Take 1 tablet (5 mg total) by mouth every 6 (six) hours as needed for severe pain or breakthrough pain.   traMADol 50 MG tablet Commonly known as:  ULTRAM Take 1-2  tablets (50-100 mg total) by mouth every 6 (six) hours as needed (mild pain).        Follow-up Information    [facility]. Go on 12/05/2017.   Specialty:  General Surgery Why:  Your appointment is at 9:45 AM. Please arrive 30 min prior to appointment time. Bring photo ID and insurance information.  Contact information: [address]   [phone]         [facility], [place]. Go on 11/27/2017.   Specialty:  General Surgery Why:  Your appointment for drain removal is at 10:00 AM. Please arrive 30 min prior to appointment time. Bring photo ID and insurance information. Contact information: [address] [place] [facility] [phone]          Signed: Brigid Re, [facility] Surgery 11/21/2017, 10:52 AM Pager: [phone] Consults: [phone] Mon-Fri 7:00 am-4:30 pm Sat-Sun 7:00 am-11:30 am

## 2017-11-21 NOTE — Discharge Instructions (Signed)
Please arrive at least 30 min before your appointment to complete your check in paperwork.  If you are unable to arrive 30 min prior to your appointment time we may have to cancel or reschedule you. ° °LAPAROSCOPIC SURGERY: POST OP INSTRUCTIONS  °1. DIET: Follow a light bland diet the first 24 hours after arrival home, such as soup, liquids, crackers, etc. Be sure to include lots of fluids daily. Avoid fast food or heavy meals as your are more likely to get nauseated. Eat a low fat the next few days after surgery.  °2. Take your usually prescribed home medications unless otherwise directed. °3. PAIN CONTROL:  °1. Pain is best controlled by a usual combination of three different methods TOGETHER:  °1. Ice/Heat °2. Over the counter pain medication °3. Prescription pain medication °2. Most patients will experience some swelling and bruising around the incisions. Ice packs or heating pads (30-60 minutes up to 6 times a day) will help. Use ice for the first few days to help decrease swelling and bruising, then switch to heat to help relax tight/sore spots and speed recovery. Some people prefer to use ice alone, heat alone, alternating between ice & heat. Experiment to what works for you. Swelling and bruising can take several weeks to resolve.  °3. It is helpful to take an over-the-counter pain medication regularly for the first few weeks. Choose one of the following that works best for you:  °1. Naproxen (Aleve, etc) Two 220mg tabs twice a day °2. Ibuprofen (Advil, etc) Three 200mg tabs four times a day (every meal & bedtime) °3. Acetaminophen (Tylenol, etc) 500-650mg four times a day (every meal & bedtime) °4. A prescription for pain medication (such as oxycodone, hydrocodone, etc) should be given to you upon discharge. Take your pain medication as prescribed.  °1. If you are having problems/concerns with the prescription medicine (does not control pain, nausea, vomiting, rash, itching, etc), please call us [phone] to see if we need to switch you to a different pain medicine that will work better for you and/or control your side effect better. °2. If you need a refill on your pain medication, please contact your pharmacy. They will contact our office to request authorization. Prescriptions will not be filled after 5 pm or on week-ends. °4. Avoid getting constipated. Between the surgery and the pain medications, it is common to experience some constipation. Increasing fluid intake and taking a fiber supplement (such as Metamucil, Citrucel, FiberCon, MiraLax, etc) 1-2 times a day regularly will usually help prevent this problem from occurring. A mild laxative (prune juice, Milk of Magnesia, MiraLax, etc) should be taken according to package directions if there are no bowel movements after 48 hours.  °5. Watch out for diarrhea. If you have many loose bowel movements, simplify your diet to bland foods & liquids for a few days. Stop any stool softeners and decrease your fiber supplement. Switching to mild anti-diarrheal medications (Kayopectate, Pepto Bismol) can help. If this worsens or does not improve, please call us. °6. Wash / shower every day. You may shower over the dressings as they are waterproof. Continue to shower over incision(s) after the dressing is off. °7. Remove your waterproof bandages 5 days after surgery. You may leave the incision open to air. You may replace a dressing/Band-Aid to cover the incision for comfort if you wish.  °8. ACTIVITIES as tolerated:  °1. You may resume regular (light) daily activities beginning the next day--such as daily self-care, walking, climbing stairs--gradually   increasing activities as tolerated. If you can walk 30 minutes without difficulty, it is safe to try more intense activity such as jogging, treadmill, bicycling, low-impact aerobics, swimming, etc. 2. Save the most intensive and strenuous activity for last such as sit-ups, heavy lifting, contact sports, etc Refrain  from any heavy lifting or straining until you are off narcotics for pain control.  3. DO NOT PUSH THROUGH PAIN. Let pain be your guide: If it hurts to do something, don't do it. Pain is your body warning you to avoid that activity for another week until the pain goes down. 4. You may drive when you are no longer taking prescription pain medication, you can comfortably wear a seatbelt, and you can safely maneuver your car and apply brakes. 5. You may have sexual intercourse when it is comfortable.  9. FOLLOW UP in our office  1. Please call [facility] at [phone] to set up an appointment to see your surgeon in the office for a follow-up appointment approximately 2-3 weeks after your surgery. 2. Make sure that you call for this appointment the day you arrive home to insure a convenient appointment time.      10. IF YOU HAVE DISABILITY OR FAMILY LEAVE FORMS, BRING THEM TO THE               OFFICE FOR PROCESSING.   WHEN TO CALL us [phone]:  1. Poor pain control 2. Reactions / problems with new medications (rash/itching, nausea, etc)  3. Fever over 101.5 F (38.5 C) 4. Inability to urinate 5. Nausea and/or vomiting 6. Worsening swelling or bruising 7. Continued bleeding from incision. 8. Increased pain, redness, or drainage from the incision  The clinic staff is available to answer your questions during regular business hours (8:30am-5pm). Please dont hesitate to call and ask to speak to one of our nurses for clinical concerns.  If you have a medical emergency, go to the nearest emergency room or call 911.  A surgeon from [facility] is always on call at the [facility], [place], [place], [place], [place] [address] ?  MAIN: [phone] ? TOLL FREE: [phone] ?  FAX [phone]  [email or web address]   [facility] Surgical drains are used to remove extra fluid that normally builds up in a surgical  wound after surgery. A surgical drain helps to heal a surgical wound. Different kinds of surgical drains include:  Active drains. These drains use suction to pull drainage away from the surgical wound. Drainage flows through a tube to a container outside of the body. It is important to keep the bulb or the drainage container flat (compressed) at all times, except while you empty it. Flattening the bulb or container creates suction. The two most common types of active drains are bulb drains and Hemovac drains.  Passive drains. These drains allow fluid to drain naturally, by gravity. Drainage flows through a tube to a bandage (dressing) or a container outside of the body. Passive drains do not need to be emptied. The most common type of passive drain is the Penrose drain.  A drain is placed during surgery. Immediately after surgery, drainage is usually bright red and a little thicker than water. The drainage may gradually turn yellow or pink and become thinner. It is likely that your health care provider will remove the drain when the drainage stops or when the amount decreases to 1-2 Tbsp (15-30 mL) during  a 24-hour period. How to care for your surgical drain  Keep the skin around the drain dry and covered with a dressing at all times.  Check your drain area every day for signs of infection. Check for: ? More redness, swelling, or pain. ? Pus or a bad smell. ? Cloudy drainage. Follow instructions from your health care provider about how to take care of your drain and how to change your dressing. Change your dressing at least one time every day. Change it more often if needed to keep the dressing dry. Make sure you: 1. Gather your supplies, including: ? Tape. ? Germ-free cleaning solution (sterile saline). ? Split gauze drain sponge: 4 x 4 inches (10 x 10 cm). ? Gauze square: 4 x 4 inches (10 x 10 cm). 2. Wash your hands with soap and water before you change your dressing. If soap and water are  not available, use hand sanitizer. 3. Remove the old dressing. Avoid using scissors to do that. 4. Use sterile saline to clean your skin around the drain. 5. Place the tube through the slit in a drain sponge. Place the drain sponge so that it covers your wound. 6. Place the gauze square or another drain sponge on top of the drain sponge that is on the wound. Make sure the tube is between those layers. 7. Tape the dressing to your skin. 8. If you have an active bulb or Hemovac drain, tape the drainage tube to your skin 1-2 inches (2.5-5 cm) below the place where the tube enters your body. Taping keeps the tube from pulling on any stitches (sutures) that you have. 9. Wash your hands with soap and water. 10. Write down the color of your drainage and how often you change your dressing.  How to empty your active bulb or Hemovac drain 1. Make sure that you have a measuring cup that you can empty your drainage into. 2. Wash your hands with soap and water. If soap and water are not available, use hand sanitizer. 3. Gently move your fingers down the tube while squeezing very lightly. This is called stripping the tube. This clears any drainage, clots, or tissue from the tube. ? Do not pull on the tube. ? You may need to strip the tube several times every day to keep the tube clear. 4. Open the bulb cap or the drain plug. Do not touch the inside of the cap or the bottom of the plug. 5. Empty all of the drainage into the measuring cup. 6. Compress the bulb or the container and replace the cap or the plug. To compress the bulb or the container, squeeze it firmly in the middle while you close the cap or plug the container. 7. Write down the amount of drainage that you have in each 24-hour period. If you have less than 2 Tbsp (30 mL) of drainage during 24 hours, contact your health care provider. 8. Flush the drainage down the toilet. 9. Wash your hands with soap and water. Contact a health care provider  if:  You have more redness, swelling, or pain around your drain area.  The amount of drainage that you have is increasing instead of decreasing.  You have pus or a bad smell coming from your drain area.  You have a fever.  You have drainage that is cloudy.  There is a sudden stop or a sudden decrease in the amount of drainage that you have.  Your tube falls out.  Your active draindoes  not stay compressedafter you empty it. This information is not intended to replace advice given to you by your health care provider. Make sure you discuss any questions you have with your health care provider. Document Released: 09/02/2000 Document Revised: 02/11/2016 Document Reviewed: 03/25/2015 Elsevier Interactive Patient Education  2018 [employer].

## 2017-11-21 NOTE — Progress Notes (Signed)
Patient taught and was able to demonstrate correctly how to empty JP drain and recharge. Instructed patient to keep a record of JP drainage amount and take that information to the doctor. Discharge and medication instructions reviewed. Questions answered and patient denies further questions. Patient's ride home will arrive between 1500 and 1530 today. Donne Hazel, RN

## 2017-11-22 ENCOUNTER — Telehealth: Payer: Self-pay | Admitting: Family Medicine

## 2017-11-22 MED ORDER — ALLOPURINOL 100 MG PO TABS: 100.0000 mg | ORAL_TABLET | Freq: Every day | ORAL | 0 refills | Status: DC

## 2017-11-22 NOTE — Telephone Encounter (Signed)
Request refill for allopurinol   Last refill was  12/22/16  LOV  05/16/17  Pharmacy  [id number]  [place]  PCP  Garret Reddish, MD  Please review

## 2017-11-22 NOTE — Telephone Encounter (Signed)
Copied from [place]. Topic: Quick Communication - Rx Refill/Question >> Nov 22, 2017  9:18 AM Neva Seat wrote: Allopurinol 100 mg  [facility]   [id number] [place], [place] - [place] [address] [place] [address] Phone: [phone] Fax: [phone]

## 2017-11-22 NOTE — Telephone Encounter (Signed)
See note

## 2017-11-22 NOTE — Telephone Encounter (Signed)
Rx sent to pharmacy   

## 2018-01-17 ENCOUNTER — Other Ambulatory Visit: Payer: Self-pay | Admitting: Family Medicine

## 2018-02-17 ENCOUNTER — Other Ambulatory Visit: Payer: Self-pay | Admitting: Family Medicine

## 2018-05-20 ENCOUNTER — Other Ambulatory Visit: Payer: Self-pay | Admitting: Family Medicine

## 2018-08-22 ENCOUNTER — Other Ambulatory Visit: Payer: Self-pay | Admitting: Family Medicine

## 2018-11-07 ENCOUNTER — Other Ambulatory Visit: Payer: Self-pay | Admitting: Family Medicine

## 2018-11-07 MED ORDER — AMLODIPINE BESYLATE 5 MG PO TABS: 5.0000 mg | ORAL_TABLET | Freq: Every day | ORAL | 0 refills | Status: DC

## 2018-11-07 NOTE — Telephone Encounter (Signed)
See note

## 2018-11-07 NOTE — Telephone Encounter (Signed)
Medication: amLODipine (NORVASC) 5 MG tablet    Patient called to request a refill for the above medication. Patient stated that he is all out of this medication    Preferred Pharmacy (with phone number or street name): [id number] [place], [place] - [place] [phone] (Phone)  [phone] (Fax)

## 2018-11-29 ENCOUNTER — Other Ambulatory Visit: Payer: Self-pay | Admitting: Family Medicine

## 2019-01-01 ENCOUNTER — Other Ambulatory Visit: Payer: Self-pay | Admitting: Family Medicine

## 2019-01-01 NOTE — Telephone Encounter (Signed)
Patient need to schedule an ov for more refills. Lm for pt to return call. 

## 2019-01-02 ENCOUNTER — Ambulatory Visit (INDEPENDENT_AMBULATORY_CARE_PROVIDER_SITE_OTHER): Payer: BC Managed Care – PPO | Admitting: Family Medicine

## 2019-01-02 ENCOUNTER — Encounter: Payer: Self-pay | Admitting: Family Medicine

## 2019-01-02 VITALS — BP 125/82 | Temp 97.9°F | Ht 71.0 in | Wt 217.2 lb

## 2019-01-02 DIAGNOSIS — I671 Cerebral aneurysm, nonruptured: Secondary | ICD-10-CM | POA: Diagnosis not present

## 2019-01-02 DIAGNOSIS — F410 Panic disorder [episodic paroxysmal anxiety] without agoraphobia: Secondary | ICD-10-CM

## 2019-01-02 DIAGNOSIS — M1009 Idiopathic gout, multiple sites: Secondary | ICD-10-CM | POA: Diagnosis not present

## 2019-01-02 DIAGNOSIS — I10 Essential (primary) hypertension: Secondary | ICD-10-CM

## 2019-01-02 MED ORDER — AMLODIPINE BESYLATE 5 MG PO TABS: 5.0000 mg | ORAL_TABLET | Freq: Every day | ORAL | 3 refills | Status: DC

## 2019-01-02 MED ORDER — ALLOPURINOL 100 MG PO TABS: 100.0000 mg | ORAL_TABLET | Freq: Every day | ORAL | 3 refills | Status: DC

## 2019-01-02 MED ORDER — CLONAZEPAM 0.5 MG PO TABS: 0.5000 mg | ORAL_TABLET | Freq: Two times a day (BID) | ORAL | 0 refills | Status: DC | PRN

## 2019-01-02 MED ORDER — TRAMADOL HCL 50 MG PO TABS: 50.0000 mg | ORAL_TABLET | Freq: Four times a day (QID) | ORAL | 0 refills | Status: DC | PRN

## 2019-01-02 NOTE — Patient Instructions (Addendum)
Video visit

## 2019-01-02 NOTE — Progress Notes (Signed)
Phone [phone]   Subjective:  Virtual visit via Video note. Chief complaint: Chief Complaint  Patient presents with  . Medication Refill    Allopurinol, Amlodipine, Clonazepam and Tramadol  . Gout  . Hypertension   This visit type was conducted due to national recommendations for restrictions regarding the COVID-19 Pandemic (e.g. social distancing).  This format is felt to be most appropriate for this patient at this time balancing risks to patient and risks to population by having him in for in person visit.  No physical exam was performed (except for noted visual exam or audio findings with Telehealth visits).    Our team/I connected with John Costa on 01/02/19 at 10:40 AM EDT by a video enabled telemedicine application ([email or web address]) and verified that I am speaking with the correct person using two identifiers.  Location patient: Home[facility] Location provider: [facility], office Persons participating in the virtual visit:  patient  Our team/I discussed the limitations of evaluation and management by telemedicine and the availability of in person appointments. In light of current covid-19 pandemic, patient also understands that we are trying to protect them by minimizing in office contact if at all possible.  The patient expressed consent for telemedicine visit and agreed to proceed. Patient understands insurance will be billed.   ROS- No chest pain or shortness of breath. No fever/chills/blurry vision.     Past Medical History-  Patient Active Problem List   Diagnosis Date Noted  . Aneurysm, carotid artery, internal 02/18/2011    Priority: High  . Hypertension 05/21/2017    Priority: Medium  . History of adenomatous polyp of colon 12/25/2015    Priority: Medium  . Panic attacks 11/26/2014    Priority: Medium  . Depression 03/14/2011    Priority: Medium  . Gout 06/12/2007    Priority: Medium  . Gangrenous cholecystitis s/p lap cholecystectomy 11/19/2017 11/18/2017   Priority: Low  . Allergic rhinitis 11/26/2014    Priority: Low  . Skin lesion 11/26/2014    Priority: Low  . Nephrolithiasis 09/19/2010    Priority: Low  . Pityriasis rosea 01/21/2008    Priority: Low  . Gilbert's disease 06/12/2007    Priority: Low    Medications- reviewed and updated Current Outpatient Medications  Medication Sig Dispense Refill  . acetaminophen (TYLENOL) 500 MG tablet Take 2 tablets (1,000 mg total) by mouth 3 (three) times daily. 30 tablet 0  . allopurinol (ZYLOPRIM) 100 MG tablet Take 1 tablet (100 mg total) by mouth daily. 90 tablet 3  . amLODipine (NORVASC) 5 MG tablet Take 1 tablet (5 mg total) by mouth daily. 90 tablet 3  . CALCIUM & MAGNESIUM CARBONATES PO Take 1 tablet by mouth daily.    . clonazePAM (KLONOPIN) 0.5 MG tablet Take 1 tablet (0.5 mg total) by mouth 2 (two) times daily as needed for anxiety. 60 tablet 0  . fluticasone (FLONASE) 50 MCG/ACT nasal spray Place into both nostrils daily.    Marland Kitchen ibuprofen (ADVIL,MOTRIN) 200 MG tablet Take 800 mg by mouth every 6 (six) hours as needed for moderate pain. Reported on 11/19/2015    . Multiple Vitamins-Minerals (ONE-A-DAY 50 PLUS PO) Take 1 tablet by mouth.    . traMADol (ULTRAM) 50 MG tablet Take 1-2 tablets (50-100 mg total) by mouth every 6 (six) hours as needed (mild pain). 30 tablet 0  . Loratadine-Pseudoephedrine (CLARITIN-D 24 HOUR PO) Take 1 tablet by mouth every Monday, Wednesday, and Friday.     No current facility-administered medications for this  visit.      Objective:  BP 125/82   Temp 97.9 F (36.6 C) (Oral)   Ht 5\' 11"  (1.803 m)   Wt 217 lb 4 oz (98.5 kg)   BMI 30.30 kg/m  Gen: NAD, resting comfortably Lungs: nonlabored, normal respiratory rate  Skin: appears dry, no obvious rash Normal speech     Assessment and Plan   #hypertension  S: controlled on amlodipine 5 mg.  Patient had tightened up his diet with a low-carb diet and seemed to need less medicine.  He originally after  gallbladder surgery was doing medicine every 3 daysbut now is back to daily (diet that got BP down has not been as sustainable due to shifts in what he can tolerate with gallbladder gone) - low carb was what helped him but this really does not go well with post gallbladder surgery self.  BP Readings from Last 3 Encounters:  01/02/19 125/82  11/21/17 123/82  05/16/17 (!) 138/102  A/P: Stable. Continue current medications-amlodipine 5 mg  # Gout S:patient doing well on allopurinol 100mg . In 2 years he has not had gout flare and then two weeks ago he had a mild ankle flare- high doses of ibuprofen and was better in 18 hours A/P: largely stable- one mild flare- will get updated hemoglobin A1c in 6 months  #Intermittent anxiety/panic attacks S: Last refill of clonazepam was in 2018-patient is using very sparingly for more heightened anxiety.  Due to infrequent use he prefers not to take something on a daily basis A/P:  Stable. Continue current medications.  Refilled clonazepam  #Intermittent headaches - started after carotid artery aneurysm and have persisted S: Frontal avoid NSAIDs if possible.  Patient has done well on tramadol.Last tramadol refill 11/21/2017- has bottle but expired.  A/P: Stable-refilled tramadol as he is doing well with sparing use  Other notes: 1.  Had 9 month on site job at [employer] last visit- but now working from home.  He is taking precautions to stay safe for coronavirus and recommended he continue those. 2. Need to update phq9- history of depression but appears to be off ssri/wellbutrin- need to track back when stopped  Physical 6 months with updated bloodwork  Meds ordered this encounter  Medications  . amLODipine (NORVASC) 5 MG tablet    Sig: Take 1 tablet (5 mg total) by mouth daily.    Dispense:  90 tablet    Refill:  3  . allopurinol (ZYLOPRIM) 100 MG tablet    Sig: Take 1 tablet (100 mg total) by mouth daily.    Dispense:  90 tablet    Refill:  3  .  clonazePAM (KLONOPIN) 0.5 MG tablet    Sig: Take 1 tablet (0.5 mg total) by mouth 2 (two) times daily as needed for anxiety.    Dispense:  60 tablet    Refill:  0  . traMADol (ULTRAM) 50 MG tablet    Sig: Take 1-2 tablets (50-100 mg total) by mouth every 6 (six) hours as needed (mild pain).    Dispense:  30 tablet    Refill:  0   Return precautions advised.  Garret Reddish, MD

## 2019-04-04 ENCOUNTER — Other Ambulatory Visit: Payer: Self-pay | Admitting: Family Medicine

## 2019-04-05 NOTE — Telephone Encounter (Signed)
Last OV 01/02/19 Last refill 01/02/19 #60/0 Next OV not scheduled

## 2019-07-16 ENCOUNTER — Encounter: Payer: Self-pay | Admitting: Family Medicine

## 2019-07-17 NOTE — Telephone Encounter (Signed)
Please call pt and schedule appt with Dr. Yong Channel can use Same day for this week.

## 2019-07-18 ENCOUNTER — Ambulatory Visit: Payer: BC Managed Care – PPO | Admitting: Family Medicine

## 2019-07-18 ENCOUNTER — Encounter: Payer: Self-pay | Admitting: Family Medicine

## 2019-07-18 ENCOUNTER — Other Ambulatory Visit: Payer: Self-pay

## 2019-07-18 VITALS — BP 118/78 | HR 91 | Temp 97.6°F | Ht 71.0 in | Wt 219.7 lb

## 2019-07-18 DIAGNOSIS — M25522 Pain in left elbow: Secondary | ICD-10-CM | POA: Diagnosis not present

## 2019-07-18 DIAGNOSIS — M7022 Olecranon bursitis, left elbow: Secondary | ICD-10-CM | POA: Diagnosis not present

## 2019-07-18 NOTE — Patient Instructions (Addendum)
Health Maintenance Due  Topic Date Due  . TETANUS/TDAP - hold off since just had flu shot and donating blood- can schedule nurse visit in future 05/16/2016   Lets try motrin 600mg  3x a day separated by at least 6-8 hours. Glad to hear GI side effects low- you can have some pepcid on hand if needed reflux plus meds do increase GI bleed risk and Pepcid can reduce this risk.   Try compression sleeve  Give me an update 10-14 days and if not at least improving or if it just bothers you more- we can get you in with one of our sports medicine doctors- likely Dr. Georgina Snell  If things change like redness, swelling, pain increase- let us know sooner

## 2019-07-18 NOTE — Progress Notes (Signed)
Phone [phone]  Subjective:  In person visit John Costa is a 53 y.o. year old very pleasant male patient who presents for/with See problem oriented charting Chief Complaint  Patient presents with  . Elbow Pain     ROS-no fever/chills.  No redness or significant pain over left elbow-some tenderness if presses upon the area.  Past Medical History-  Patient Active Problem List   Diagnosis Date Noted  . Aneurysm, carotid artery, internal 02/18/2011    Priority: High  . Hypertension 05/21/2017    Priority: Medium  . History of adenomatous polyp of colon 12/25/2015    Priority: Medium  . Panic attacks 11/26/2014    Priority: Medium  . Depression 03/14/2011    Priority: Medium  . Gout 06/12/2007    Priority: Medium  . Gangrenous cholecystitis s/p lap cholecystectomy 11/19/2017 11/18/2017    Priority: Low  . Allergic rhinitis 11/26/2014    Priority: Low  . Skin lesion 11/26/2014    Priority: Low  . Nephrolithiasis 09/19/2010    Priority: Low  . Pityriasis rosea 01/21/2008    Priority: Low  . Gilbert's disease 06/12/2007    Priority: Low    Medications- reviewed and updated Current Outpatient Medications  Medication Sig Dispense Refill  . acetaminophen (TYLENOL) 500 MG tablet Take 2 tablets (1,000 mg total) by mouth 3 (three) times daily. 30 tablet 0  . allopurinol (ZYLOPRIM) 100 MG tablet Take 1 tablet (100 mg total) by mouth daily. 90 tablet 3  . amLODipine (NORVASC) 5 MG tablet Take 1 tablet (5 mg total) by mouth daily. 90 tablet 3  . CALCIUM & MAGNESIUM CARBONATES PO Take 1 tablet by mouth daily.    . clonazePAM (KLONOPIN) 0.5 MG tablet TAKE 1 TABLET (0.5 MG TOTAL) BY MOUTH 2 (TWO) TIMES DAILY AS NEEDED FOR ANXIETY. 60 tablet 0  . fluticasone (FLONASE) 50 MCG/ACT nasal spray Place into both nostrils daily.    Marland Kitchen ibuprofen (ADVIL,MOTRIN) 200 MG tablet Take 800 mg by mouth every 6 (six) hours as needed for moderate pain. Reported on 11/19/2015    . Multiple  Vitamins-Minerals (ONE-A-DAY 50 PLUS PO) Take 1 tablet by mouth.    . traMADol (ULTRAM) 50 MG tablet Take 1-2 tablets (50-100 mg total) by mouth every 6 (six) hours as needed (mild pain). 30 tablet 0      Objective:  BP 118/78   Pulse 91   Temp 97.6 F (36.4 C) (Temporal)   Ht 5\' 11"  (1.803 m)   Wt 219 lb 11.2 oz (99.7 kg)   SpO2 97%   BMI 30.64 kg/m  Gen: NAD, resting comfortably CV: RRR no murmurs rubs or gallops Lungs: CTAB no crackles, wheeze, rhonchi Abdomen: soft/nontender/nondistended/normal bowel sounds Ext: no edema Skin: warm, dry MSK: 2.5 x 2.5 cm area enlarged over the left olecranon bursa-area is fluctuant and not significantly tender.  No significant erythema or warmth.  Raised perhaps half a centimeter    Assessment and Plan    # Left elbow pain/swelling: S: Started about three weeks. Day after golf noticed some swelling and bruising the next day. He has tried ice, ibuprofen-800 mg perhaps twice a day perhaps 3 days a week, and rest. Swelling has improved. Is still having some tenderness in the area with touch but otherwise does not bother him.  Pain mild aching. A/P: Olecranon bursitis of left elbow From [facility]S "  Patient Instructions   Health Maintenance Due  Topic Date Due  . TETANUS/TDAP - hold off since  just had flu shot and donating blood- can schedule nurse visit in future 05/16/2016   Lets try motrin 600mg  3x a day separated by at least 6-8 hours. Glad to hear GI side effects low- you can have some pepcid on hand if needed reflux plus meds do increase GI bleed risk and Pepcid can reduce this risk.   Try compression sleeve  Give me an update 10-14 days and if not at least improving or if it just bothers you more- we can get you in with one of our sports medicine doctors- likely Dr. Georgina Snell  If things change like redness, swelling, pain increase- let us know sooner  "  Of note-declines labs today-Will send me a copy of labs from biometric screen as has  this in the next week or 2.  If any anemia-Of note donating double reds today.   Lab/Order associations:   ICD-10-CM   1. Olecranon bursitis of left elbow  M70.22   2. Left elbow pain  M25.522    Return precautions advised.  Garret Reddish, MD

## 2019-07-31 ENCOUNTER — Encounter: Payer: Self-pay | Admitting: Family Medicine

## 2019-08-01 ENCOUNTER — Ambulatory Visit: Payer: BC Managed Care – PPO | Admitting: Family Medicine

## 2019-08-01 ENCOUNTER — Other Ambulatory Visit: Payer: Self-pay

## 2019-08-01 ENCOUNTER — Ambulatory Visit (INDEPENDENT_AMBULATORY_CARE_PROVIDER_SITE_OTHER): Payer: BC Managed Care – PPO

## 2019-08-01 ENCOUNTER — Encounter: Payer: Self-pay | Admitting: Family Medicine

## 2019-08-01 VITALS — BP 126/84 | HR 89 | Ht 71.0 in | Wt 219.3 lb

## 2019-08-01 DIAGNOSIS — M25522 Pain in left elbow: Secondary | ICD-10-CM | POA: Diagnosis not present

## 2019-08-01 DIAGNOSIS — M7022 Olecranon bursitis, left elbow: Secondary | ICD-10-CM

## 2019-08-01 NOTE — Patient Instructions (Signed)
Thank you for coming in today. Use the body helix compression sleeve.  Recheck if not improving.  Call or go to the ER if you develop a large red swollen joint with extreme pain or oozing puss.   Elbow Bursitis  Bursitis is swelling and pain at the tip of the elbow. This happens when fluid builds up in a sac under the skin (bursa). This may also be called olecranon bursitis. What are the causes? Elbow bursitis may be caused by:  Elbow injury, such as falling onto the elbow.  Leaning on hard surfaces for long periods of time.  Infection from an injury that breaks the skin near the elbow.  A bone growth (spur) that forms at the tip of the elbow.  A medical condition that causes inflammation, such as gout or rheumatoid arthritis. Sometimes the cause is not known. What are the signs or symptoms? The first sign of elbow bursitis is usually swelling at the tip of the elbow. This can grow to be about the size of a golf ball. Swelling may start suddenly or develop gradually. Other symptoms may include:  Pain when bending or leaning on the elbow.  Not being able to move the elbow normally. If bursitis is caused by an infection, you may have:  Redness, warmth, and tenderness of the elbow.  Drainage of pus from the swollen area over the elbow, if the skin breaks open. How is this diagnosed? This condition may be diagnosed based on:  Your symptoms and medical history.  Any recent injuries you have had.  A physical exam.  X-rays to check for a bone spur or fracture.  Draining fluid from the bursa to test it for infection.  Blood tests to rule out gout or rheumatoid arthritis. How is this treated? Treatment for elbow bursitis depends on the cause. Treatment may include:  Medicines. These may include: ? Over-the-counter medicines to relieve pain and inflammation. ? Antibiotic medicines. ? Injections of anti-inflammatory medicines (steroids).  Draining fluid from the bursa.   Wrapping your elbow with a bandage.  Wearing elbow pads. If these treatments do not help, you may need surgery to remove the bursa. Follow these instructions at home: Medicines  Take over-the-counter and prescription medicines only as told by your health care provider.  If you were prescribed an antibiotic medicine, take it as told by your health care provider. Do not stop taking the antibiotic even if you start to feel better. Managing pain, stiffness, and swelling   If directed, put ice on your elbow: ? Put ice in a plastic bag. ? Place a towel between your skin and the bag. ? Leave the ice on for 20 minutes, 2-3 times a day.  If your bursitis is caused by an injury, rest your elbow and wear your bandage as told by your health care provider.  Use elbow pads or elbow wraps to cushion your elbow as needed. General instructions  Avoid any activities that cause elbow pain. Ask your health care provider what activities are safe for you.  Keep all follow-up visits as told by your health care provider. This is important. Contact a health care provider if you have:  A fever.  Symptoms that do not get better with treatment.  Pain or swelling that: ? Gets worse. ? Goes away and then comes back.  Pus draining from your elbow. Get help right away if you have:  Trouble moving your arm, hand, or fingers. Summary  Elbow bursitis is inflammation of the  fluid-filled sac (bursa) between the tip of your elbow bone (olecranon) and your skin.  Treatment for elbow bursitis depends on the cause. It may include medicines to relieve pain and inflammation, antibiotic medicines, and draining fluid from your elbow.  Contact a health care provider if your symptoms do not get better with treatment, or if your symptoms go away and then come back. This information is not intended to replace advice given to you by your health care provider. Make sure you discuss any questions you have with your  health care provider. Document Released: 10/05/2006 Document Revised: 08/18/2017 Document Reviewed: 08/15/2017 Elsevier Patient Education  2020 [employer].

## 2019-08-01 NOTE — Progress Notes (Signed)
Subjective:    CC: L elbow swelling  I, Wendy Poet, LAT, ATC, am serving as scribe for Dr. Lynne Leader.  HPI: Pt is a 53 y/o male presenting w/ c/o L elbow pain.  He reports swelling and bruising in his L elbow x 3 weeks.  Swelling occurred after playing a round of golf. He has tried ice, rest, IBU and saw his PCP on 07/18/19.  At that time an elbow compression sleeve was recommended as well as Motrin tid.  Since he has been wearing the fabric compression sleeve and taking Motrin, he doesn't notice much change.  Yesterday, he noticed increased swelling, redness and warmth at the L olecranon which spurred him to make this visit.  His only aggravating factor is when he puts pressure on his L elbow/olecranon.  The only time he experiences pain in his L elbow is when he puts pressure on his elbow.  Otherwise, he has no pain.  Past medical history, Surgical history, Family history not pertinant except as noted below, Social history, Allergies, and medications have been entered into the medical record, reviewed, and no changes needed.   Review of Systems: No headache, visual changes, nausea, vomiting, diarrhea, constipation, dizziness, abdominal pain, skin rash, fevers, chills, night sweats, weight loss, swollen lymph nodes, body aches, joint swelling, muscle aches, chest pain, shortness of breath, mood changes, visual or auditory hallucinations.   Objective:    Vitals:   08/01/19 0840  BP: 126/84  Pulse: 89  SpO2: 96%   General: Well Developed, well nourished, and in no acute distress.  Neuro/Psych: Alert and oriented x3, extra-ocular muscles intact, able to move all 4 extremities, sensation grossly intact. Skin: Warm and dry, no rashes noted.  Respiratory: Not using accessory muscles, speaking in full sentences, trachea midline.  Cardiovascular: Pulses palpable, no extremity edema. Abdomen: Does not appear distended. MSK: Left elbow swelling mildly at olecranon.  No significant skin  erythema.  Not particularly tender.  Normal elbow motion.  Pulses cap refill and sensation are intact distally.  Lab and Radiology Results:  Limited musculoskeletal ultrasound left olecranon. Hypoechoic fluid collecting superficial to bone cortex consistent in appearance with olecranon bursitis. Normal bony structures. Impression: Olecranon bursitis.  Procedure: Real-time Ultrasound Guided Injection of left olecranon bursa Device: Philips Affiniti 50G Images permanently stored and available for review in the ultrasound unit. Verbal informed consent obtained.  Discussed risks and benefits of procedure. Warned about infection bleeding damage to structures skin hypopigmentation and fat atrophy among others. Patient expresses understanding and agreement Time-out conducted.   Noted no overlying erythema, induration, or other signs of local infection.   Skin prepped in a sterile fashion.   Local anesthesia: Topical Ethyl chloride.   With sterile technique and under real time ultrasound guidance:  40 mg of Depo-Medrol and 0.5 milliliters of Marcaine (total volume 1 mL) injected easily.   Completed without difficulty   Pain immediately resolved suggesting accurate placement of the medication.   Advised to call if fevers/chills, erythema, induration, drainage, or persistent bleeding.   Images permanently stored and available for review in the ultrasound unit.  Impression: Technically successful ultrasound guided injection.       Impression and Recommendations:    Assessment and Plan: 53 y.o. male with left elbow olecranon bursitis.  Doubtful for any infection at this time.  Plan to treat with injection as above as well as compressive padded elbow sleeve.  Recommend body helix.  Recheck back if not improving.  Cautioned against  traumatizing the elbow.  Return sooner if needed.Marland Kitchen  PDMP not reviewed this encounter. Orders Placed This Encounter  Procedures  . [place] - Upper Extremity - Limited  - LEFT    Order Specific Question:   Reason for Exam (SYMPTOM  OR DIAGNOSIS REQUIRED)    Answer:   L elbow pain    Order Specific Question:   Preferred imaging location?    Answer:   [facility]   No orders of the defined types were placed in this encounter.   Discussed warning signs or symptoms. Please see discharge instructions. Patient expresses understanding.   The above documentation has been reviewed and is accurate and complete Lynne Leader

## 2019-08-15 ENCOUNTER — Encounter: Payer: Self-pay | Admitting: Family Medicine

## 2019-08-16 NOTE — Telephone Encounter (Signed)
Just an FYI for you

## 2019-08-19 NOTE — Telephone Encounter (Signed)
Left message to return call to our office.  

## 2019-08-19 NOTE — Telephone Encounter (Signed)
Called patient has app for tomorrow.

## 2019-08-20 ENCOUNTER — Encounter: Payer: Self-pay | Admitting: Family Medicine

## 2019-08-20 ENCOUNTER — Ambulatory Visit: Payer: BC Managed Care – PPO | Admitting: Family Medicine

## 2019-08-20 ENCOUNTER — Other Ambulatory Visit: Payer: Self-pay

## 2019-08-20 VITALS — BP 112/84 | HR 86 | Temp 97.7°F | Ht 71.0 in | Wt 235.6 lb

## 2019-08-20 DIAGNOSIS — F325 Major depressive disorder, single episode, in full remission: Secondary | ICD-10-CM

## 2019-08-20 DIAGNOSIS — Z125 Encounter for screening for malignant neoplasm of prostate: Secondary | ICD-10-CM | POA: Diagnosis not present

## 2019-08-20 DIAGNOSIS — R1032 Left lower quadrant pain: Secondary | ICD-10-CM | POA: Diagnosis not present

## 2019-08-20 DIAGNOSIS — R635 Abnormal weight gain: Secondary | ICD-10-CM

## 2019-08-20 DIAGNOSIS — I10 Essential (primary) hypertension: Secondary | ICD-10-CM

## 2019-08-20 DIAGNOSIS — M1009 Idiopathic gout, multiple sites: Secondary | ICD-10-CM

## 2019-08-20 DIAGNOSIS — Z23 Encounter for immunization: Secondary | ICD-10-CM

## 2019-08-20 LAB — LIPID PANEL
Cholesterol: 210 mg/dL — ABNORMAL HIGH (ref 0–200)
HDL: 50.9 mg/dL
LDL Cholesterol: 141 mg/dL — ABNORMAL HIGH (ref 0–99)
NonHDL: 158.89
Total CHOL/HDL Ratio: 4
Triglycerides: 87 mg/dL (ref 0.0–149.0)
VLDL: 17.4 mg/dL (ref 0.0–40.0)

## 2019-08-20 LAB — CBC WITH DIFFERENTIAL/PLATELET
Basophils Absolute: 0.1 10*3/uL (ref 0.0–0.1)
Basophils Relative: 0.7 % (ref 0.0–3.0)
Eosinophils Absolute: 0.2 10*3/uL (ref 0.0–0.7)
Eosinophils Relative: 1.9 % (ref 0.0–5.0)
HCT: 43.2 % (ref 39.0–52.0)
Hemoglobin: 14.7 g/dL (ref 13.0–17.0)
Lymphocytes Relative: 17.3 % (ref 12.0–46.0)
Lymphs Abs: 1.5 10*3/uL (ref 0.7–4.0)
MCHC: 34 g/dL (ref 30.0–36.0)
MCV: 86 fl (ref 78.0–100.0)
Monocytes Absolute: 0.4 10*3/uL (ref 0.1–1.0)
Monocytes Relative: 5.3 % (ref 3.0–12.0)
Neutro Abs: 6.3 10*3/uL (ref 1.4–7.7)
Neutrophils Relative %: 74.8 % (ref 43.0–77.0)
Platelets: 256 10*3/uL (ref 150.0–400.0)
RBC: 5.03 Mil/uL (ref 4.22–5.81)
RDW: 16.1 % — ABNORMAL HIGH (ref 11.5–15.5)
WBC: 8.4 10*3/uL (ref 4.0–10.5)

## 2019-08-20 LAB — POC URINALSYSI DIPSTICK (AUTOMATED)
Bilirubin, UA: NEGATIVE
Blood, UA: NEGATIVE
Glucose, UA: NEGATIVE
Ketones, UA: NEGATIVE
Leukocytes, UA: NEGATIVE
Nitrite, UA: NEGATIVE
Protein, UA: NEGATIVE
Spec Grav, UA: 1.015 (ref 1.010–1.025)
Urobilinogen, UA: 1 E.U./dL
pH, UA: 6 (ref 5.0–8.0)

## 2019-08-20 LAB — URIC ACID: Uric Acid, Serum: 8.4 mg/dL — ABNORMAL HIGH (ref 4.0–7.8)

## 2019-08-20 LAB — COMPREHENSIVE METABOLIC PANEL
ALT: 17 U/L (ref 0–53)
AST: 17 U/L (ref 0–37)
Albumin: 4.6 g/dL (ref 3.5–5.2)
Alkaline Phosphatase: 66 U/L (ref 39–117)
BUN: 11 mg/dL (ref 6–23)
CO2: 28 mEq/L (ref 19–32)
Calcium: 9.9 mg/dL (ref 8.4–10.5)
Chloride: 101 mEq/L (ref 96–112)
Creatinine, Ser: 1.12 mg/dL (ref 0.40–1.50)
GFR: 68.62 mL/min (ref >60.00)
Glucose, Bld: 108 mg/dL — ABNORMAL HIGH (ref 70–99)
Potassium: 4.6 mEq/L (ref 3.5–5.1)
Sodium: 138 mEq/L (ref 135–145)
Total Bilirubin: 1.9 mg/dL — ABNORMAL HIGH (ref 0.2–1.2)
Total Protein: 7.2 g/dL (ref 6.0–8.3)

## 2019-08-20 LAB — PSA: PSA: 1.4 ng/mL (ref 0.10–4.00)

## 2019-08-20 LAB — TSH: TSH: 1 u[IU]/mL (ref 0.35–4.50)

## 2019-08-20 MED ORDER — CYCLOBENZAPRINE HCL 5 MG PO TABS: 5.0000 mg | ORAL_TABLET | Freq: Three times a day (TID) | ORAL | 0 refills | Status: DC | PRN

## 2019-08-20 NOTE — Addendum Note (Signed)
Addended by: Clyde Lundborg A on: 08/20/2019 10:54 AM   Modules accepted: Orders

## 2019-08-20 NOTE — Patient Instructions (Addendum)
Health Maintenance Due  Topic Date Due  . TETANUS/TDAP - today 05/16/2016   Try flexeril if issues recur- this really does seem to be muscular/muscle wall related. I like the heating pad and pressure idea as well when occurs.   Check labs to make sure no obvious internal issues but I do not suspect that as a cause.   Please let us know if episodes recur and are not responsive to treatment   If weight gain continues- please see [place] back   Recommended follow up: 6- 74months for physical  Please stop by lab before you go If you do not have mychart- we will call you about results within 5 business days of [place] receiving them.  If you have mychart- we will send your results within 3 business days of [place] receiving them.  If abnormal or we want to clarify a result, we will call or mychart you to make sure you receive the message.  If you have questions or concerns or don't hear within 5-7 days, please send [place] a message or call us.

## 2019-08-20 NOTE — Addendum Note (Signed)
Addended by: Francis Dowse T on: 08/20/2019 11:05 AM   Modules accepted: Orders

## 2019-08-20 NOTE — Progress Notes (Signed)
Phone [phone] In person visit   Subjective:   John Costa is a 53 y.o. year old very pleasant male patient who presents for/with See problem oriented charting Chief Complaint  Patient presents with  . Follow-up  . Abdominal Pain    ROS- no fever/chills/nausea/vomiting. No constipation or diarrhea.    This visit occurred during the SARS-CoV-2 public health emergency.  Safety protocols were in place, including screening questions prior to the visit, additional usage of staff PPE, and extensive cleaning of exam room while observing appropriate contact time as indicated for disinfecting solutions.   Past Medical History-  Patient Active Problem List   Diagnosis Date Noted  . Aneurysm, carotid artery, internal 02/18/2011    Priority: High  . Hypertension 05/21/2017    Priority: Medium  . History of adenomatous polyp of colon 12/25/2015    Priority: Medium  . Panic attacks 11/26/2014    Priority: Medium  . Depression 03/14/2011    Priority: Medium  . Gout 06/12/2007    Priority: Medium  . Gangrenous cholecystitis s/p lap cholecystectomy 11/19/2017 11/18/2017    Priority: Low  . Allergic rhinitis 11/26/2014    Priority: Low  . Skin lesion 11/26/2014    Priority: Low  . Nephrolithiasis 09/19/2010    Priority: Low  . Pityriasis rosea 01/21/2008    Priority: Low  . Gilbert's disease 06/12/2007    Priority: Low    Medications- reviewed and updated Current Outpatient Medications  Medication Sig Dispense Refill  . acetaminophen (TYLENOL) 500 MG tablet Take 2 tablets (1,000 mg total) by mouth 3 (three) times daily. 30 tablet 0  . allopurinol (ZYLOPRIM) 100 MG tablet Take 1 tablet (100 mg total) by mouth daily. 90 tablet 3  . amLODipine (NORVASC) 5 MG tablet Take 1 tablet (5 mg total) by mouth daily. 90 tablet 3  . CALCIUM & MAGNESIUM CARBONATES PO Take 1 tablet by mouth daily.    . clonazePAM (KLONOPIN) 0.5 MG tablet TAKE 1 TABLET (0.5 MG TOTAL) BY MOUTH 2 (TWO)  TIMES DAILY AS NEEDED FOR ANXIETY. 60 tablet 0  . fluticasone (FLONASE) 50 MCG/ACT nasal spray Place into both nostrils daily.    Marland Kitchen ibuprofen (ADVIL,MOTRIN) 200 MG tablet Take 800 mg by mouth every 6 (six) hours as needed for moderate pain. Reported on 11/19/2015    . Multiple Vitamins-Minerals (ONE-A-DAY 50 PLUS PO) Take 1 tablet by mouth.    . traMADol (ULTRAM) 50 MG tablet Take 1-2 tablets (50-100 mg total) by mouth every 6 (six) hours as needed (mild pain). 30 tablet 0  . cyclobenzaprine (FLEXERIL) 5 MG tablet Take 1 tablet (5 mg total) by mouth 3 (three) times daily as needed for muscle spasms (dont drive for 6-8 hours after taking). 20 tablet 0   No current facility-administered medications for this visit.      Objective:  BP 112/84   Pulse 86   Temp 97.7 F (36.5 C)   Ht 5\' 11"  (1.803 m)   Wt 235 lb 9.6 oz (106.9 kg)   SpO2 96%   BMI 32.86 kg/m  Gen: NAD, resting comfortably CV: RRR no murmurs rubs or gallops Lungs: CTAB no crackles, wheeze, rhonchi Abdomen: soft/nontender other than some tenderness over musculature of left lower quadrant/nondistended/normal bowel sounds. No rebound or guarding.  Ext: no edema Skin: warm, dry    Assessment and Plan  Left lower quadrant Abd Pain- more superficial over muscles Weight gain S: Patient states on Tuesday morning of last week patient started  with some sharp cramping in left lower abdomen.  Pain was up to 10 out of 10 but only lasted for a few seconds and then there would be a break for over an hour.  The frequency increased after the initial few episodes.  Pain could be so severe that he cannot stand up straight completely.  He was visibly seeing contraction under the skin when episodes were severe.  He tried a heating pad and pressure and that did seem to help.  No association with eating or drinking.  He took a 2 mg tablet of clonazepam and that did seem to be significantly helpful. By Friday on 4 30 symptoms were starting to improve  when he got mychart message, he went to an urgent care but had over 2 hour wait and decided to go home- opted to go in if had fever, redness, warmth, heat, etc.  pt states his abd pain has gotten better since that time- very mild once or twice a day at that point.   Took 3 doses over 2 days.   He has a 9-10 mm kidney stone and wonders if perhaps it had shifted.   Had noticed weight increase from 220 (likes to be around 115) up to 232- tried a 2 day fast and wonders if that may have lined up- he started eating but symptoms continued- started with naked juice but still escalated quickly. No change in bowels during the time- no constipation or diarrhea A/P: Unclear cause of weight gain- will update labs including TSH. I wonder if he had some salt retention and caused weight gain if this could have affected his electrolyte balance and caused muscle spasms. Glad spasms have improved but still update labs. Will let him have flexeril on hand in case recurrent- clonazepam likely acted as muscle relaxant and was a good choice (he will avoid using together)   - discussed possible x-ray but doubt would change management- would be evaluating any obvious stone movement with history nephrolithiasis- he agrees to this if symptoms recur/worsen  #hypertension S: compliant with amlodipine 5 mg BP Readings from Last 3 Encounters:  08/20/19 112/84  08/01/19 126/84  07/18/19 118/78  A/P: Stable. Continue current medications.   #Gout S: no obvious flares  on allopurinol 100 mg.olecranon bursitis 08/01/2019 could have been related Lab Results  Component Value Date   LABURIC 8.5 (H) 07/27/2015  A/P: hopefully uric acid closer to 6- update today.   # Depression/panic attacks S: Patient has been on SSRI (lexapro last 2017) in the past or Wellbutrin (last 2017)-he has done well off these medicines   Very sparing use of clonazepam for anxiety. Depression screen PHQ 2/9 08/20/2019  Decreased Interest 0  Down,  Depressed, Hopeless 0  PHQ - 2 Score 0  Altered sleeping 0  Tired, decreased energy 1  Change in appetite 0  Feeling bad or failure about yourself  0  Trouble concentrating 0  Moving slowly or fidgety/restless 0  Suicidal thoughts 0  PHQ-9 Score 1  Difficult doing work/chores Not difficult at all  A/P: full remission of depression. Anxiety doing well with very sparing clonazepam     Recommended follow up: 6- 9 months for physical  Lab/Order associations:   ICD-10-CM   1. Left lower quadrant abdominal pain  R10.32 POCT Urinalysis Dipstick (Automated)  2. Essential hypertension  I10 CBC with Differential/Platelet    Comprehensive metabolic panel    Lipid panel  3. Idiopathic gout of multiple sites, unspecified chronicity  M10.09  Uric Acid  4. Major depressive disorder with single episode, in full remission ([place])  F32.5   5. Weight gain  R63.5 TSH   Meds ordered this encounter  Medications  . cyclobenzaprine (FLEXERIL) 5 MG tablet    Sig: Take 1 tablet (5 mg total) by mouth 3 (three) times daily as needed for muscle spasms (dont drive for 6-8 hours after taking).    Dispense:  20 tablet    Refill:  0    Return precautions advised.  Garret Reddish, MD

## 2019-08-21 ENCOUNTER — Other Ambulatory Visit: Payer: Self-pay

## 2019-08-21 MED ORDER — ALLOPURINOL 100 MG PO TABS: 100.0000 mg | ORAL_TABLET | Freq: Two times a day (BID) | ORAL | 3 refills | Status: DC

## 2019-12-18 ENCOUNTER — Other Ambulatory Visit: Payer: Self-pay | Admitting: Family Medicine

## 2020-01-03 ENCOUNTER — Ambulatory Visit (INDEPENDENT_AMBULATORY_CARE_PROVIDER_SITE_OTHER): Payer: BC Managed Care – PPO | Admitting: Family Medicine

## 2020-01-03 ENCOUNTER — Other Ambulatory Visit: Payer: Self-pay

## 2020-01-03 ENCOUNTER — Encounter: Payer: Self-pay | Admitting: Family Medicine

## 2020-01-03 VITALS — BP 118/82 | HR 67 | Temp 98.1°F | Ht 71.0 in | Wt 243.0 lb

## 2020-01-03 DIAGNOSIS — I671 Cerebral aneurysm, nonruptured: Secondary | ICD-10-CM

## 2020-01-03 DIAGNOSIS — B351 Tinea unguium: Secondary | ICD-10-CM

## 2020-01-03 DIAGNOSIS — F325 Major depressive disorder, single episode, in full remission: Secondary | ICD-10-CM | POA: Diagnosis not present

## 2020-01-03 DIAGNOSIS — M1009 Idiopathic gout, multiple sites: Secondary | ICD-10-CM

## 2020-01-03 DIAGNOSIS — B353 Tinea pedis: Secondary | ICD-10-CM

## 2020-01-03 DIAGNOSIS — Z79899 Other long term (current) drug therapy: Secondary | ICD-10-CM

## 2020-01-03 MED ORDER — TERBINAFINE HCL 250 MG PO TABS: 250.0000 mg | ORAL_TABLET | Freq: Every day | ORAL | 0 refills | Status: DC

## 2020-01-03 MED ORDER — TRAMADOL HCL 50 MG PO TABS: 50.0000 mg | ORAL_TABLET | Freq: Four times a day (QID) | ORAL | 0 refills | Status: DC | PRN

## 2020-01-03 NOTE — Assessment & Plan Note (Signed)
S: Controlled on SSRI or wellbutrin in past-now doing well without medication as of 2021.  Does still get some occasional anxiety-Clonazepam very sparing perhaps once a month Depression screen [facility] 2/9 01/03/2020 08/20/2019  Decreased Interest 0 0  Down, Depressed, Hopeless 0 0  PHQ - 2 Score 0 0  Altered sleeping 0 0  Tired, decreased energy 1 1  Change in appetite 0 0  Feeling bad or failure about yourself  0 0  Trouble concentrating 1 0  Moving slowly or fidgety/restless 0 0  Suicidal thoughts 0 0  PHQ-9 Score 2 1  Difficult doing work/chores Not difficult at all Not difficult at all  A/P: Appears to be in full remission with PHQ-9 under 5 without SSRI.  I think sparing clonazepam is reasonable-he would likely request refill at his annual visit and at that point would discard old medications

## 2020-01-03 NOTE — Assessment & Plan Note (Signed)
Per prior history: "Treated at [facility] in [place], [place]. 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 Unclear if ever had CVA. States he was released from their care."  Dr. Leanne Chang notes also mentioned pronounced fibromuscular dysplasia.  Patient has had recurrent headaches since that time that are controlled with tramadol but may not take for several months.  CurrentlyTramadol close to expiring and hed like a refill for headaches. Thankfully at [place] recently.

## 2020-01-03 NOTE — Progress Notes (Signed)
Phone [phone] In person visit   Subjective:   John Costa is a 54 y.o. year old very pleasant male patient who presents for/with See problem oriented charting Chief Complaint  Patient presents with  . Tinea Pedis    This visit occurred during the SARS-CoV-2 public health emergency.  Safety protocols were in place, including screening questions prior to the visit, additional usage of staff PPE, and extensive cleaning of exam room while observing appropriate contact time as indicated for disinfecting solutions.   Past Medical History-  Patient Active Problem List   Diagnosis Date Noted  . Aneurysm, carotid artery, internal 02/18/2011    Priority: High  . Hypertension 05/21/2017    Priority: Medium  . History of adenomatous polyp of colon 12/25/2015    Priority: Medium  . Panic attacks 11/26/2014    Priority: Medium  . Depression 03/14/2011    Priority: Medium  . Gout 06/12/2007    Priority: Medium  . Gangrenous cholecystitis s/p lap cholecystectomy 11/19/2017 11/18/2017    Priority: Low  . Allergic rhinitis 11/26/2014    Priority: Low  . Skin lesion 11/26/2014    Priority: Low  . Nephrolithiasis 09/19/2010    Priority: Low  . Pityriasis rosea 01/21/2008    Priority: Low  . Gilbert's disease 06/12/2007    Priority: Low    Medications- reviewed and updated Current Outpatient Medications  Medication Sig Dispense Refill  . acetaminophen (TYLENOL) 500 MG tablet Take 2 tablets (1,000 mg total) by mouth 3 (three) times daily. 30 tablet 0  . allopurinol (ZYLOPRIM) 100 MG tablet Take 1 tablet (100 mg total) by mouth 2 (two) times daily. 180 tablet 3  . amLODipine (NORVASC) 5 MG tablet TAKE 1 TABLET BY MOUTH EVERY DAY 90 tablet 3  . CALCIUM & MAGNESIUM CARBONATES PO Take 1 tablet by mouth daily.    . clonazePAM (KLONOPIN) 0.5 MG tablet TAKE 1 TABLET (0.5 MG TOTAL) BY MOUTH 2 (TWO) TIMES DAILY AS NEEDED FOR ANXIETY. 60 tablet 0  . cyclobenzaprine (FLEXERIL) 5 MG  tablet Take 1 tablet (5 mg total) by mouth 3 (three) times daily as needed for muscle spasms (dont drive for 6-8 hours after taking). 20 tablet 0  . fluticasone (FLONASE) 50 MCG/ACT nasal spray Place into both nostrils daily.    . Multiple Vitamins-Minerals (ONE-A-DAY 50 PLUS PO) Take 1 tablet by mouth.    . traMADol (ULTRAM) 50 MG tablet Take 1-2 tablets (50-100 mg total) by mouth every 6 (six) hours as needed (mild pain). 30 tablet 0  . ibuprofen (ADVIL,MOTRIN) 200 MG tablet Take 800 mg by mouth every 6 (six) hours as needed for moderate pain. Reported on 11/19/2015    . terbinafine (LAMISIL) 250 MG tablet Take 1 tablet (250 mg total) by mouth daily. 90 tablet 0   No current facility-administered medications for this visit.     Objective:  BP 118/82   Pulse 67   Temp 98.1 F (36.7 C) (Temporal)   Ht 5\' 11"  (1.803 m)   Wt 243 lb (110.2 kg)   SpO2 99%   BMI 33.89 kg/m  Gen: NAD, resting comfortably Left foot: Patient with erythematous hyperpigmented patch behind left ankle-pictured below-raised border noted.  Patient also with erythema and scaling on lateral portion of foot and over 3rd-5th toe.  Onychomycosis noted left third toe        Assessment and Plan   # Tinea Pedis  #Tinea corporis #Onychomycosis toenail S:Symptoms started about 3 months ago. On  left foot only. Located on heal and top of toes. He has been using Lotrimin AF three times a day and Zesorb powder after showers. Has not had any improvement. Has been using grey or black socks. Changing bedding, wearing open shoes and spraying shoes with cleaner and putting Zesorbe powder as well. Does not feel like feet sweat during the day. Denies any cracking or bleeding. Other family members that use same shower have not had issues. Patient states very itchy but does not burn.   If he stops symptoms edge of lesion will intensify with pruritis and become very erythemtaous. Has not gone between the toes.   Also has noted  underarms itchy with no rash  In the past has had this only on left foot and usually clears with same treatment A/P: Patient appears to have tinea pedis-also with tinea corporis with ringworm lesion around his Achilles.  Patient also with onychomycosis of left third toenail.  We considered a course of fluconazole for 2 to 6 weeks but that would not necessarily cover the onychomycosis-ultimately we decided on terbinafine for 12 weeks with LFTs 6 weeks-patient has high baseline bilirubin but other LFTs normal and I believe this is acceptable risk. -Patient has had recurrent episodes of tinea pedis in the past that typically respond well to [place] wonder if the onychomycosis could be a triggering point so we thought it would be better to treat both -If patient fails to improve within 2 to 3 weeks I would like to refer him to dermatology for their opinion   Depression S: Controlled on SSRI or wellbutrin in past-now doing well without medication as of 2021.  Does still get some occasional anxiety-Clonazepam very sparing perhaps once a month Depression screen [facility] 2/9 01/03/2020 08/20/2019  Decreased Interest 0 0  Down, Depressed, Hopeless 0 0  PHQ - 2 Score 0 0  Altered sleeping 0 0  Tired, decreased energy 1 1  Change in appetite 0 0  Feeling bad or failure about yourself  0 0  Trouble concentrating 1 0  Moving slowly or fidgety/restless 0 0  Suicidal thoughts 0 0  PHQ-9 Score 2 1  Difficult doing work/chores Not difficult at all Not difficult at all  A/P: Appears to be in full remission with PHQ-9 under 5 without SSRI.  I think sparing clonazepam is reasonable-he would likely request refill at his annual visit and at that point would discard old medications   Gout S: No recent flares reported on 200 mg allopurinol. Lab Results  Component Value Date   LABURIC 8.4 (H) 08/20/2019  A/P: Dosage was increased at last visit-patient agrees to come back for LFTs in 6 weeks in regards to terbinafine  treatment-we also opted at that time to get an updated uric acid level-I would like for this level to ideally be below 6 but if he is not having any flareups we may tolerate a higher number-check in the next visit.  For now continue current medication  Aneurysm, carotid artery, internal Per prior history: "Treated at [facility] clinic in [place], [place]. 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 Unclear if ever had CVA. States he was released from their care."  Dr. Leanne Chang notes also mentioned pronounced fibromuscular dysplasia.  Patient has had recurrent headaches since that time that are controlled with tramadol but may not take for several months.  CurrentlyTramadol close to expiring and hed like a refill for headaches. Thankfully at [facility] recently.  Recommended follow up: Patient will keep already scheduled visit in June Future Appointments  Date Time Provider [place]  02/11/2020  9:45 AM [facility]  02/26/2020 10:40 AM Yong Channel, Brayton Mars, MD [facility]    Lab/Order associations:   ICD-10-CM   1. Tinea pedis of left foot  B35.3   2. Onychomycosis  B35.1   3. Idiopathic gout of multiple sites, unspecified chronicity  M10.09 Uric acid  4. High risk medication use  Z79.899 Comprehensive metabolic panel  5. Major depressive disorder with single episode, in full remission ([place])  F32.5     Meds ordered this encounter  Medications  . traMADol (ULTRAM) 50 MG tablet    Sig: Take 1-2 tablets (50-100 mg total) by mouth every 6 (six) hours as needed (mild pain).    Dispense:  30 tablet    Refill:  0  . terbinafine (LAMISIL) 250 MG tablet    Sig: Take 1 tablet (250 mg total) by mouth daily.    Dispense:  90 tablet    Refill:  0   Return precautions advised.  Garret Reddish, MD

## 2020-01-03 NOTE — Patient Instructions (Addendum)
Terbinafine for 2 weeks would cover tinea pedis/rash on feet. We opted for 12 weeks to try to knock out toenail fungus as well.   If rash not improving in 2-3 weeks lets do a dermatology referral but continue terbinafine for toenail  Schedule 6 week lab visit for uric acid and liver check.   Recommended follow up: Return for follow up- or sooner if needed. otherwise we have the June check in so hopefully things are improving/resolved by then

## 2020-01-03 NOTE — Assessment & Plan Note (Addendum)
S: No recent flares reported on 200 mg allopurinol. Lab Results  Component Value Date   LABURIC 8.4 (H) 08/20/2019  A/P: Dosage was increased at last visit-patient agrees to come back for LFTs in 6 weeks in regards to terbinafine treatment-we also opted at that time to get an updated uric acid level-I would like for this level to ideally be below 6 but if he is not having any flareups we may tolerate a higher number-check in the next visit.  For now continue current medication

## 2020-01-08 ENCOUNTER — Encounter: Payer: Self-pay | Admitting: Family Medicine

## 2020-02-11 ENCOUNTER — Encounter: Payer: Self-pay | Admitting: Family Medicine

## 2020-02-11 ENCOUNTER — Other Ambulatory Visit (INDEPENDENT_AMBULATORY_CARE_PROVIDER_SITE_OTHER): Payer: BC Managed Care – PPO

## 2020-02-11 ENCOUNTER — Other Ambulatory Visit: Payer: Self-pay

## 2020-02-11 DIAGNOSIS — Z79899 Other long term (current) drug therapy: Secondary | ICD-10-CM

## 2020-02-11 DIAGNOSIS — M1009 Idiopathic gout, multiple sites: Secondary | ICD-10-CM

## 2020-02-11 LAB — COMPREHENSIVE METABOLIC PANEL
ALT: 26 U/L (ref 0–53)
AST: 24 U/L (ref 0–37)
Albumin: 4.7 g/dL (ref 3.5–5.2)
Alkaline Phosphatase: 68 U/L (ref 39–117)
BUN: 20 mg/dL (ref 6–23)
CO2: 29 mEq/L (ref 19–32)
Calcium: 9.9 mg/dL (ref 8.4–10.5)
Chloride: 102 mEq/L (ref 96–112)
Creatinine, Ser: 1.1 mg/dL (ref 0.40–1.50)
GFR: 69.94 mL/min (ref >60.00)
Glucose, Bld: 125 mg/dL — ABNORMAL HIGH (ref 70–99)
Potassium: 4.3 mEq/L (ref 3.5–5.1)
Sodium: 138 mEq/L (ref 135–145)
Total Bilirubin: 1.1 mg/dL (ref 0.2–1.2)
Total Protein: 7.3 g/dL (ref 6.0–8.3)

## 2020-02-11 LAB — URIC ACID: Uric Acid, Serum: 6 mg/dL (ref 4.0–7.8)

## 2020-02-25 ENCOUNTER — Encounter: Payer: Self-pay | Admitting: Family Medicine

## 2020-02-25 NOTE — Telephone Encounter (Signed)
Ok to place referral to dermatology?  

## 2020-02-26 ENCOUNTER — Encounter: Payer: BC Managed Care – PPO | Admitting: Family Medicine

## 2020-02-26 ENCOUNTER — Other Ambulatory Visit: Payer: Self-pay

## 2020-02-26 DIAGNOSIS — B353 Tinea pedis: Secondary | ICD-10-CM

## 2020-03-03 ENCOUNTER — Telehealth: Payer: Self-pay | Admitting: Physician Assistant

## 2020-03-03 NOTE — Telephone Encounter (Signed)
Patient calling to schedule referral appointment.  Being referred by [place] at [facility]. Appointment scheduled for 06/23/2020 @ 9:00 with Robyne Askew, PA-C.

## 2020-05-29 ENCOUNTER — Encounter: Payer: BC Managed Care – PPO | Admitting: Family Medicine

## 2020-06-23 ENCOUNTER — Ambulatory Visit: Payer: BC Managed Care – PPO | Admitting: Physician Assistant

## 2020-09-10 ENCOUNTER — Other Ambulatory Visit: Payer: Self-pay | Admitting: Family Medicine

## 2020-10-01 NOTE — Patient Instructions (Addendum)
Health Maintenance Due  Topic Date Due  . INFLUENZA VACCINE Done today in office.  04/19/2020   We will call you within two weeks about your referral to GI for colonoscopy. If you do not hear within 2 weeks, give [place] a call.   - prefer home BP <135/85 on average- update me on mychart in 1-2 months  If you want referral to ortho when things calmed down- can send [place] a message  Please stop by lab before you go If you have mychart- we will send your results within 3 business days of [place] receiving them.  If you do not have mychart- we will call you about results within 5 business days of [place] receiving them.  *please also note that you will see labs on mychart as soon as they post. I will later go in and write notes on them- will say "notes from Dr. Yong Channel"  Recommended follow up: Return in about 1 year (around 10/02/2021) for follow up- or sooner if needed, physical or sooner if needed. unless home BP looks great on at least monthly checks

## 2020-10-01 NOTE — Progress Notes (Signed)
Phone: [phone]   Subjective:  Patient presents today for their annual physical. Chief complaint-noted.   See problem oriented charting- ROS- full  review of systems was completed and negative  except for:  Difficulty urinating- getting stream started is hard if bladder is full, joint pain, seasonal allergies  The following were reviewed and entered/updated in epic: Past Medical History:  Diagnosis Date  . Allergy   . Anal fissure   . Anemia   . Anxiety   . Cataract    removed bilateral  . Cerebral aneurysm   . CHOLELITHIASIS 06/12/2007   Qualifier: Diagnosis of  By: Scherrie Gerlach    . Depression    no treatment  . GERD (gastroesophageal reflux disease)    occasional  . Gilbert's syndrome   . Gout   . Headache(784.0)    from cerebral aneurysm  2008  . HTN (hypertension)   . Migraines   . Nephrolithiasis 09/19/2010  . Pelvic kidney   . Pneumonia    2010  . PONV (postoperative nausea and vomiting)    Patient Active Problem List   Diagnosis Date Noted  . Aneurysm, carotid artery, internal 02/18/2011    Priority: High  . Hypertension 05/21/2017    Priority: Medium  . History of adenomatous polyp of colon 12/25/2015    Priority: Medium  . Panic attacks 11/26/2014    Priority: Medium  . Depression 03/14/2011    Priority: Medium  . Gout 06/12/2007    Priority: Medium  . Gangrenous cholecystitis s/p lap cholecystectomy 11/19/2017 11/18/2017    Priority: Low  . Allergic rhinitis 11/26/2014    Priority: Low  . Skin lesion 11/26/2014    Priority: Low  . Nephrolithiasis 09/19/2010    Priority: Low  . Pityriasis rosea 01/21/2008    Priority: Low  . Gilbert's disease 06/12/2007    Priority: Low   Past Surgical History:  Procedure Laterality Date  . ARTERIAL ANEURYSM REPAIR  2008  . CATARACT EXTRACTION     cataracts with lens implants  . CHOLECYSTECTOMY N/A 11/19/2017   Procedure: LAPAROSCOPIC CHOLECYSTECTOMY;  Surgeon: Excell Seltzer, MD;  Location: [facility]  ORS;  Service: General;  Laterality: N/A;  . CYSTOSCOPY WITH RETROGRADE PYELOGRAM, URETEROSCOPY AND STENT PLACEMENT Left 08/24/2015   Procedure: CYSTOSCOPY WITHLEFT RETROGRADE PYELOGRAM, URETEROSCOPY AND STENT PLACEMENT;  Surgeon: Raynelle Bring, MD;  Location: [facility] ORS;  Service: Urology;  Laterality: Left;  . HOLMIUM LASER APPLICATION Left [id number]   Procedure:  WITH HOLMIUM LASER LITHOTRIPSY;  Surgeon: Raynelle Bring, MD;  Location: [facility] ORS;  Service: Urology;  Laterality: Left;  . knee arthroscopies Bilateral    has had this several times  . STONE EXTRACTION WITH BASKET  12/30/10  . UPPER GASTROINTESTINAL ENDOSCOPY      Family History  Problem Relation Age of Onset  . Prostate cancer Father 22  . Diabetes Brother   . Hemochromatosis Brother   . Diabetes Sister   . Hemachromatosis Sister   . Colon cancer Neg Hx   . Colon polyps Neg Hx     Medications- reviewed and updated Current Outpatient Medications  Medication Sig Dispense Refill  . acetaminophen (TYLENOL) 500 MG tablet Take 2 tablets (1,000 mg total) by mouth 3 (three) times daily. 30 tablet 0  . allopurinol (ZYLOPRIM) 100 MG tablet TAKE 1 TABLET BY MOUTH TWICE A DAY 180 tablet 3  . amLODipine (NORVASC) 5 MG tablet TAKE 1 TABLET BY MOUTH EVERY DAY 90 tablet 3  . CALCIUM & MAGNESIUM CARBONATES PO Take  1 tablet by mouth daily.    . cyclobenzaprine (FLEXERIL) 5 MG tablet Take 1 tablet (5 mg total) by mouth 3 (three) times daily as needed for muscle spasms (dont drive for 6-8 hours after taking). 20 tablet 0  . fluticasone (FLONASE) 50 MCG/ACT nasal spray Place into both nostrils daily.    Marland Kitchen ibuprofen (ADVIL,MOTRIN) 200 MG tablet Take 800 mg by mouth every 6 (six) hours as needed for moderate pain. Reported on 11/19/2015    . Multiple Vitamins-Minerals (ONE-A-DAY 50 PLUS PO) Take 1 tablet by mouth.    . clonazePAM (KLONOPIN) 0.5 MG tablet Take 1 tablet (0.5 mg total) by mouth 2 (two) times daily as needed for anxiety. 50 tablet 0  .  traMADol (ULTRAM) 50 MG tablet Take 1-2 tablets (50-100 mg total) by mouth every 6 (six) hours as needed (mild pain). 30 tablet 1   No current facility-administered medications for this visit.    Allergies-reviewed and updated Allergies  Allergen Reactions  . Cheese Anaphylaxis  . Vicodin [Hydrocodone-Acetaminophen] Other (See Comments)    vicodin gives him a headache    Social History   Social History Narrative   Married (wife patient elsewhere). 3 children.       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       Hobbies: play golf, reading, walking, swimming   Objective  Objective:  BP 132/88   Pulse 89   Temp (!) 97.3 F (36.3 C) (Temporal)   Ht 5\' 11"  (1.803 m)   Wt 215 lb 12.8 oz (97.9 kg)   SpO2 98%   BMI 30.10 kg/m  Gen: NAD, resting comfortably HEENT: Mucous membranes are moist. Oropharynx normal Neck: no thyromegaly CV: RRR no murmurs rubs or gallops Lungs: CTAB no crackles, wheeze, rhonchi Abdomen: soft/nontender/nondistended/normal bowel sounds. No rebound or guarding.  Ext: trace edema Skin: warm, dry Neuro: grossly normal, moves all extremities, PERRLA   Assessment and Plan  55 y.o. male presenting for annual physical.  Health Maintenance counseling: 1. Anticipatory guidance: Patient counseled regarding regular dental exams -q6 months in general- advised follow up when things settle down, eye exams - has done well since cataract surgery- goes q2yearly,  avoiding smoking and second hand smoke , no illicit drugs, limiting alcohol to 2 beverages per day - 2 per week.   2. Risk factor reduction:  Advised patient of need for regular exercise and diet rich and fruits and vegetables to reduce risk of heart attack and stroke. Exercise- gym person then out with covid- 20-25 mins 2-3 days a week rowing plus walking dog. Diet-intermittent fasting has worked very well for him- less achy even with this- hed like to  push down to 200 potentially.  Wt Readings from Last 3 Encounters:  10/02/20 215 lb 12.8 oz (97.9 kg)  01/03/20 243 lb (110.2 kg)  08/20/19 235 lb 9.6 oz (106.9 kg)  3. Immunizations/screenings/ancillary studies- flu shot today. Donates blood to red cross- has already had HCV screen as result Immunization History  Administered Date(s) Administered  . Influenza Inj Mdck Quad With Preservative 07/17/2019  . Influenza,inj,Quad PF,6+ Mos 06/24/2013, 06/12/2014, 07/03/2015, 08/03/2018  . Influenza-Unspecified 06/29/2016, 06/26/2017, 07/17/2019  . PFIZER SARS-COV-2 Vaccination 12/10/2019, 01/01/2020, 08/24/2020  . Td 05/16/2006  . Tdap 08/20/2019  4. Prostate cancer screening- low risk prior trend- update today. Having some hesitancy starting stream when bladder full- suspect some mild enlargement in prostate  Lab Results  Component Value Date  PSA 1.40 08/20/2019   PSA 1.25 12/01/2014   PSA 0.86 07/22/2013   5. Colon cancer screening - 12/17/2015 with 10 year repeat planned 6. Skin cancer screening- plans to call dermatology on his own (will go back to derm that treated his resistant athlete's foot) advised regular sunscreen use. Denies worrisome, changing, or new skin lesions.  7. never smoker 8. STD screening - only active with wife  Status of chronic or acute concerns   # Depression S: Medication:Doing well without medications as of 2021 occasional anxiety issues-sparing clonazepam- last refill in 2020 Depression screen [facility] 2/9 10/02/2020 01/03/2020 08/20/2019  Decreased Interest 0 0 0  Down, Depressed, Hopeless 0 0 0  PHQ - 2 Score 0 0 0  Altered sleeping 0 0 0  Tired, decreased energy 1 1 1   Change in appetite 0 0 0  Feeling bad or failure about yourself  0 0 0  Trouble concentrating 0 1 0  Moving slowly or fidgety/restless 0 0 0  Suicidal thoughts 0 0 0  PHQ-9 Score 1 2 1   Difficult doing work/chores Not difficult at all Not difficult at all Not difficult at all  A/P:  depression remains in full remission -remain off meds. Anxiety well controlled overall- sparing clonazepam- last refill #60 in 2020- will refill #50 as likely close to expiratoin  #Gout S: 0 flares since last visit on Allopurinol 200Mg  Lab Results  Component Value Date   LABURIC 6.0 02/11/2020  A/P:good control without flares. No need to check uric acid unless having flares or closer to year- continue current meds   #hypertension S: medication: Amlodipine 5Mg , Home readings #s: has not checked lately BP Readings from Last 3 Encounters:  10/02/20 132/88  01/03/20 118/82  08/20/19 112/84  A/P: blood pressure high normal diastolic- asked him to do some home monitoring. Will continue current meds for now- prefer home BP <135/85 on average -next step instead of increasing amlodipine we discussed trial valsartan 80 then amlodipine -valsartan 5-160 combo if needed.   #Elbow injury- history of olecranon bursitis- area healed but continues to have pain if area gets any compression. Also lingering excess tissue in the area.  - interested in ortho referral when covid pandemic in better place  Aneurysm, carotid artery, internal Per prior history: "Treated at [facility] clinic in [place], [place]. 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 Unclear if ever had CVA. States he was released from their care."  Dr. Leanne Chang notes also mentioned pronounced fibromuscular dysplasia.  Patient has had recurrent headaches since that time that are controlled with tramadol . May take 3-4 x a month and then may go a month without- triggers unclear other than things higher in nitrite free hotdogs  Recommended follow up: Return in about 1 year (around 10/02/2021) for follow up- or sooner if needed, physical or sooner if needed. As long as checking BP monthly  Lab/Order associations:  Fasting (finishing 67 hour fast)   ICD-10-CM   1. Preventative health  care  Z00.00 CBC with Differential/Platelet    Comprehensive metabolic panel    Lipid panel    PSA    Ambulatory referral to Gastroenterology  2. Primary hypertension  I10 CBC with Differential/Platelet    Comprehensive metabolic panel    Lipid panel  3. Major depressive disorder with single episode, in full remission ([place])  F32.5   4. Idiopathic gout of multiple sites, unspecified chronicity  M10.09   5. Screening for prostate  cancer  Z12.5 PSA  6. History of adenomatous polyp of colon  Z86.010 Ambulatory referral to Gastroenterology    Meds ordered this encounter  Medications  . clonazePAM (KLONOPIN) 0.5 MG tablet    Sig: Take 1 tablet (0.5 mg total) by mouth 2 (two) times daily as needed for anxiety.    Dispense:  50 tablet    Refill:  0    Not to exceed 5 additional fills before 07/01/2019  . traMADol (ULTRAM) 50 MG tablet    Sig: Take 1-2 tablets (50-100 mg total) by mouth every 6 (six) hours as needed (mild pain).    Dispense:  30 tablet    Refill:  1    Return precautions advised.  Garret Reddish, MD

## 2020-10-02 ENCOUNTER — Encounter: Payer: Self-pay | Admitting: Family Medicine

## 2020-10-02 ENCOUNTER — Ambulatory Visit (INDEPENDENT_AMBULATORY_CARE_PROVIDER_SITE_OTHER): Payer: BC Managed Care – PPO | Admitting: Family Medicine

## 2020-10-02 ENCOUNTER — Other Ambulatory Visit: Payer: Self-pay

## 2020-10-02 VITALS — BP 132/88 | HR 89 | Temp 97.3°F | Ht 71.0 in | Wt 215.8 lb

## 2020-10-02 DIAGNOSIS — Z125 Encounter for screening for malignant neoplasm of prostate: Secondary | ICD-10-CM

## 2020-10-02 DIAGNOSIS — F325 Major depressive disorder, single episode, in full remission: Secondary | ICD-10-CM | POA: Diagnosis not present

## 2020-10-02 DIAGNOSIS — I10 Essential (primary) hypertension: Secondary | ICD-10-CM

## 2020-10-02 DIAGNOSIS — Z0000 Encounter for general adult medical examination without abnormal findings: Secondary | ICD-10-CM

## 2020-10-02 DIAGNOSIS — Z86010 Personal history of colonic polyps: Secondary | ICD-10-CM

## 2020-10-02 DIAGNOSIS — Z23 Encounter for immunization: Secondary | ICD-10-CM | POA: Diagnosis not present

## 2020-10-02 DIAGNOSIS — M1009 Idiopathic gout, multiple sites: Secondary | ICD-10-CM

## 2020-10-02 MED ORDER — CLONAZEPAM 0.5 MG PO TABS: 0.5000 mg | ORAL_TABLET | Freq: Two times a day (BID) | ORAL | 0 refills | Status: DC | PRN

## 2020-10-02 MED ORDER — TRAMADOL HCL 50 MG PO TABS: 50.0000 mg | ORAL_TABLET | Freq: Four times a day (QID) | ORAL | 1 refills | Status: DC | PRN

## 2020-10-02 NOTE — Addendum Note (Signed)
Addended by: Brandy Hale on: 10/02/2020 04:06 PM   Modules accepted: Orders

## 2020-10-03 ENCOUNTER — Encounter: Payer: Self-pay | Admitting: Family Medicine

## 2020-10-03 LAB — COMPREHENSIVE METABOLIC PANEL
AG Ratio: 1.6 (calc) (ref 1.0–2.5)
ALT: 31 U/L (ref 9–46)
AST: 31 U/L (ref 10–35)
Albumin: 4.9 g/dL (ref 3.6–5.1)
Alkaline phosphatase (APISO): 67 U/L (ref 35–144)
BUN: 13 mg/dL (ref 7–25)
CO2: 27 mmol/L (ref 20–32)
Calcium: 10 mg/dL (ref 8.6–10.3)
Chloride: 98 mmol/L (ref 98–110)
Creat: 0.99 mg/dL (ref 0.70–1.33)
Globulin: 3 g/dL (calc) (ref 1.9–3.7)
Glucose, Bld: 74 mg/dL (ref 65–99)
Potassium: 4 mmol/L (ref 3.5–5.3)
Sodium: 138 mmol/L (ref 135–146)
Total Bilirubin: 2.6 mg/dL — ABNORMAL HIGH (ref 0.2–1.2)
Total Protein: 7.9 g/dL (ref 6.1–8.1)

## 2020-10-03 LAB — CBC WITH DIFFERENTIAL/PLATELET
Absolute Monocytes: 374 cells/uL (ref 200–950)
Basophils Absolute: 47 cells/uL (ref 0–200)
Basophils Relative: 0.6 %
Eosinophils Absolute: 101 cells/uL (ref 15–500)
Eosinophils Relative: 1.3 %
HCT: 44.7 % (ref 38.5–50.0)
Hemoglobin: 15.1 g/dL (ref 13.2–17.1)
Lymphs Abs: 1396 cells/uL (ref 850–3900)
MCH: 28.2 pg (ref 27.0–33.0)
MCHC: 33.8 g/dL (ref 32.0–36.0)
MCV: 83.4 fL (ref 80.0–100.0)
MPV: 11.3 fL (ref 7.5–12.5)
Monocytes Relative: 4.8 %
Neutro Abs: 5881 cells/uL (ref 1500–7800)
Neutrophils Relative %: 75.4 %
Platelets: 244 10*3/uL (ref 140–400)
RBC: 5.36 10*6/uL (ref 4.20–5.80)
RDW: 16.3 % — ABNORMAL HIGH (ref 11.0–15.0)
Total Lymphocyte: 17.9 %
WBC: 7.8 10*3/uL (ref 3.8–10.8)

## 2020-10-03 LAB — LIPID PANEL
Cholesterol: 230 mg/dL — ABNORMAL HIGH (ref <200)
HDL: 54 mg/dL (ref >=40)
LDL Cholesterol (Calc): 153 mg/dL (calc) — ABNORMAL HIGH
Non-HDL Cholesterol (Calc): 176 mg/dL (calc) — ABNORMAL HIGH (ref <130)
Total CHOL/HDL Ratio: 4.3 (calc) (ref <5.0)
Triglycerides: 114 mg/dL (ref <150)

## 2020-10-03 LAB — PSA: PSA: 1.54 ng/mL (ref <=4.0)

## 2020-10-30 ENCOUNTER — Emergency Department (HOSPITAL_COMMUNITY): Payer: BC Managed Care – PPO

## 2020-10-30 ENCOUNTER — Encounter (HOSPITAL_COMMUNITY): Payer: Self-pay

## 2020-10-30 ENCOUNTER — Emergency Department (HOSPITAL_COMMUNITY)
Admission: EM | Admit: 2020-10-30 | Discharge: 2020-10-30 | Disposition: A | Discharge status: Home / Self Care | Payer: BC Managed Care – PPO | Attending: Emergency Medicine | Admitting: Emergency Medicine

## 2020-10-30 ENCOUNTER — Other Ambulatory Visit: Payer: Self-pay

## 2020-10-30 DIAGNOSIS — N130 Hydronephrosis with ureteropelvic junction obstruction: Secondary | ICD-10-CM | POA: Insufficient documentation

## 2020-10-30 DIAGNOSIS — I10 Essential (primary) hypertension: Secondary | ICD-10-CM | POA: Diagnosis not present

## 2020-10-30 DIAGNOSIS — R079 Chest pain, unspecified: Secondary | ICD-10-CM | POA: Insufficient documentation

## 2020-10-30 DIAGNOSIS — R102 Pelvic and perineal pain: Secondary | ICD-10-CM | POA: Diagnosis present

## 2020-10-30 DIAGNOSIS — R42 Dizziness and giddiness: Secondary | ICD-10-CM | POA: Diagnosis not present

## 2020-10-30 DIAGNOSIS — R202 Paresthesia of skin: Secondary | ICD-10-CM | POA: Insufficient documentation

## 2020-10-30 DIAGNOSIS — Z79899 Other long term (current) drug therapy: Secondary | ICD-10-CM | POA: Diagnosis not present

## 2020-10-30 DIAGNOSIS — K219 Gastro-esophageal reflux disease without esophagitis: Secondary | ICD-10-CM | POA: Insufficient documentation

## 2020-10-30 DIAGNOSIS — N201 Calculus of ureter: Secondary | ICD-10-CM

## 2020-10-30 DIAGNOSIS — R0600 Dyspnea, unspecified: Secondary | ICD-10-CM | POA: Diagnosis not present

## 2020-10-30 DIAGNOSIS — Z9049 Acquired absence of other specified parts of digestive tract: Secondary | ICD-10-CM | POA: Insufficient documentation

## 2020-10-30 DIAGNOSIS — Z960 Presence of urogenital implants: Secondary | ICD-10-CM | POA: Insufficient documentation

## 2020-10-30 LAB — COMPREHENSIVE METABOLIC PANEL
ALT: 25 U/L (ref 0–44)
AST: 29 U/L (ref 15–41)
Albumin: 4.4 g/dL (ref 3.5–5.0)
Alkaline Phosphatase: 58 U/L (ref 38–126)
Anion gap: 13 (ref 5–15)
BUN: 18 mg/dL (ref 6–20)
CO2: 21 mmol/L — ABNORMAL LOW (ref 22–32)
Calcium: 9.2 mg/dL (ref 8.9–10.3)
Chloride: 106 mmol/L (ref 98–111)
Creatinine, Ser: 1.08 mg/dL (ref 0.61–1.24)
GFR, Estimated: >60 mL/min (ref >60)
Glucose, Bld: 55 mg/dL — ABNORMAL LOW (ref 70–99)
Potassium: 3.7 mmol/L (ref 3.5–5.1)
Sodium: 140 mmol/L (ref 135–145)
Total Bilirubin: 1.3 mg/dL — ABNORMAL HIGH (ref 0.3–1.2)
Total Protein: 7.6 g/dL (ref 6.5–8.1)

## 2020-10-30 LAB — CBC WITH DIFFERENTIAL/PLATELET
Abs Immature Granulocytes: 0.05 10*3/uL (ref 0.00–0.07)
Basophils Absolute: 0.1 10*3/uL (ref 0.0–0.1)
Basophils Relative: 1 %
Eosinophils Absolute: 0.1 10*3/uL (ref 0.0–0.5)
Eosinophils Relative: 1 %
HCT: 38 % — ABNORMAL LOW (ref 39.0–52.0)
Hemoglobin: 12.4 g/dL — ABNORMAL LOW (ref 13.0–17.0)
Immature Granulocytes: 1 %
Lymphocytes Relative: 12 %
Lymphs Abs: 1.2 10*3/uL (ref 0.7–4.0)
MCH: 27.7 pg (ref 26.0–34.0)
MCHC: 32.6 g/dL (ref 30.0–36.0)
MCV: 85 fL (ref 80.0–100.0)
Monocytes Absolute: 0.6 10*3/uL (ref 0.1–1.0)
Monocytes Relative: 6 %
Neutro Abs: 8 10*3/uL — ABNORMAL HIGH (ref 1.7–7.7)
Neutrophils Relative %: 79 %
Platelets: 260 10*3/uL (ref 150–400)
RBC: 4.47 MIL/uL (ref 4.22–5.81)
RDW: 15.8 % — ABNORMAL HIGH (ref 11.5–15.5)
WBC: 10 10*3/uL (ref 4.0–10.5)
nRBC: 0 % (ref 0.0–0.2)

## 2020-10-30 LAB — CBG MONITORING, ED: Glucose-Capillary: 104 mg/dL — ABNORMAL HIGH (ref 70–99)

## 2020-10-30 LAB — TROPONIN I (HIGH SENSITIVITY)
Troponin I (High Sensitivity): <2 ng/L (ref <18)
Troponin I (High Sensitivity): 5 ng/L (ref <18)

## 2020-10-30 LAB — URINALYSIS, ROUTINE W REFLEX MICROSCOPIC
Bacteria, UA: NONE SEEN
Bilirubin Urine: NEGATIVE
Glucose, UA: NEGATIVE mg/dL
Ketones, ur: 5 mg/dL — AB
Leukocytes,Ua: NEGATIVE
Nitrite: POSITIVE — AB
Protein, ur: 30 mg/dL — AB
RBC / HPF: >50 RBC/hpf — ABNORMAL HIGH (ref 0–5)
Specific Gravity, Urine: 1.021 (ref 1.005–1.030)
pH: 5 (ref 5.0–8.0)

## 2020-10-30 LAB — LIPASE, BLOOD: Lipase: 42 U/L (ref 11–51)

## 2020-10-30 MED ORDER — HYDROMORPHONE HCL 2 MG PO TABS
2.0000 mg | ORAL_TABLET | Freq: Once | ORAL | Status: AC
  Administered 2020-10-30: 2 mg via ORAL
  Filled 2020-10-30: qty 1

## 2020-10-30 MED ORDER — KETOROLAC TROMETHAMINE 15 MG/ML IJ SOLN
15.0000 mg | Freq: Once | INTRAMUSCULAR | Status: AC
  Administered 2020-10-30: 15 mg via INTRAVENOUS
  Filled 2020-10-30: qty 1

## 2020-10-30 MED ORDER — SODIUM CHLORIDE 0.9 % IV BOLUS
1000.0000 mL | Freq: Once | INTRAVENOUS | Status: AC
  Administered 2020-10-30: 1000 mL via INTRAVENOUS

## 2020-10-30 MED ORDER — HYDROMORPHONE HCL 1 MG/ML IJ SOLN
1.0000 mg | Freq: Once | INTRAMUSCULAR | Status: AC
  Administered 2020-10-30: 1 mg via INTRAVENOUS
  Filled 2020-10-30: qty 1

## 2020-10-30 MED ORDER — TAMSULOSIN HCL 0.4 MG PO CAPS: 0.4000 mg | ORAL_CAPSULE | Freq: Every day | ORAL | 0 refills | Status: DC

## 2020-10-30 MED ORDER — HYDROMORPHONE HCL 4 MG PO TABS: 2.0000 mg | ORAL_TABLET | ORAL | 0 refills | Status: DC | PRN

## 2020-10-30 MED ORDER — TAMSULOSIN HCL 0.4 MG PO CAPS
0.4000 mg | ORAL_CAPSULE | Freq: Once | ORAL | Status: AC
  Administered 2020-10-30: 0.4 mg via ORAL
  Filled 2020-10-30: qty 1

## 2020-10-30 NOTE — Discharge Instructions (Addendum)
If you develop worsening, continued, or recurrent abdominal pain, uncontrolled vomiting, fever, chest or back pain, or any other new/concerning symptoms then return to the ER for evaluation.  

## 2020-10-30 NOTE — ED Provider Notes (Signed)
[facility] DEPT Provider Note   CSN: [id number] Arrival date & time: 10/30/20  1750     History Chief Complaint  Patient presents with  . Flank Pain    John Costa is a 55 y.o. male.  HPI 55 year old male presents with pelvic pain and chest tightness.  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  However this afternoon it seemed to get a lot worse which coincided with him feeling acutely dizzy and his watch told him his heart rate was low in the 40s.  He was able to feel better but then it happened again.  It seems that the chest tightness, dizziness and dyspnea all seem to correlate with him having worse pain in his pelvis.  He was given 100 mcg IV fentanyl by EMS.  He took Azo this morning.  His pain in his pelvis is better, rate is about 8 out of 10.  Tightness in his chest is also better.  He does not currently feel dizzy.  He has noticed some numbness/tingling in his fingertips. No dysuria or fever. No hematuria.   Past Medical History:  Diagnosis Date  . Allergy   . Anal fissure   . Anemia   . Anxiety   . Cataract    removed bilateral  . Cerebral aneurysm   . CHOLELITHIASIS 06/12/2007   Qualifier: Diagnosis of  By: Scherrie Gerlach    . Depression    no treatment  . GERD (gastroesophageal reflux disease)    occasional  . Gilbert's syndrome   . Gout   . Headache(784.0)    from cerebral aneurysm  2008  . HTN (hypertension)   . Migraines   . Nephrolithiasis 09/19/2010  . Pelvic kidney   . Pneumonia    2010  . PONV (postoperative nausea and vomiting)     Patient Active Problem List   Diagnosis Date Noted  . Gangrenous cholecystitis s/p lap cholecystectomy 11/19/2017 11/18/2017  . Hypertension 05/21/2017  . History of adenomatous polyp of colon 12/25/2015  . Panic attacks 11/26/2014  .  Allergic rhinitis 11/26/2014  . Skin lesion 11/26/2014  . Depression 03/14/2011  . 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 Nephrolithiasis 09/19/2010  . Pityriasis rosea 01/21/2008  . Gout 06/12/2007  . Gilbert's disease 06/12/2007    Past Surgical History:  Procedure Laterality Date  . ARTERIAL ANEURYSM REPAIR  2008  . CATARACT EXTRACTION     cataracts with lens implants  . CHOLECYSTECTOMY N/A 11/19/2017   Procedure: LAPAROSCOPIC CHOLECYSTECTOMY;  Surgeon: Excell Seltzer, MD;  Location: [facility];  Service: General;  Laterality: N/A;  . CYSTOSCOPY WITH RETROGRADE PYELOGRAM, URETEROSCOPY AND STENT PLACEMENT Left 08/24/2015   Procedure: CYSTOSCOPY WITHLEFT RETROGRADE PYELOGRAM, URETEROSCOPY AND STENT PLACEMENT;  Surgeon: Raynelle Bring, MD;  Location: [facility];  Service: Urology;  Laterality: Left;  . HOLMIUM LASER APPLICATION Left 40/04/1447   Procedure:  WITH HOLMIUM LASER LITHOTRIPSY;  Surgeon: Raynelle Bring, MD;  Location: [facility];  Service: Urology;  Laterality: Left;  . knee arthroscopies Bilateral    has had this several times  . STONE EXTRACTION WITH BASKET  12/30/10  . UPPER GASTROINTESTINAL ENDOSCOPY         Family History  Problem Relation Age of Onset  . Prostate cancer Father 42  . Diabetes Brother   .  Hemochromatosis Brother   . Diabetes Sister   . Hemachromatosis Sister   . Colon cancer Neg Hx   . Colon polyps Neg Hx     Social History   Tobacco Use  . Smoking status: Never Smoker  . Smokeless tobacco: Never Used  Vaping Use  . Vaping Use: Never used  Substance Use Topics  . Alcohol use: Yes    Alcohol/week: 2.0 - 3.0 standard drinks    Types: 2 - 3 Standard drinks or equivalent per week    Comment: occasional  . Drug use: Not Currently    Types: Marijuana    Comment: 1990s, Has done it in legal states such as [place] since that time    Home Medications Prior to Admission medications   Medication Sig Start Date End Date Taking? Authorizing  Provider  HYDROmorphone (DILAUDID) 4 MG tablet Take 0.5-1 tablets (2-4 mg total) by mouth every 4 (four) hours as needed for severe pain. 10/30/20  Yes Sherwood Gambler, MD  tamsulosin (FLOMAX) 0.4 MG CAPS capsule Take 1 capsule (0.4 mg total) by mouth daily. 10/30/20  Yes Sherwood Gambler, MD  acetaminophen (TYLENOL) 500 MG tablet Take 2 tablets (1,000 mg total) by mouth 3 (three) times daily. 11/21/17   Norm Parcel, PA-C  allopurinol (ZYLOPRIM) 100 MG tablet TAKE 1 TABLET BY MOUTH TWICE A DAY 09/10/20   Marin Olp, MD  amLODipine (NORVASC) 5 MG tablet TAKE 1 TABLET BY MOUTH EVERY DAY 12/19/19   Marin Olp, MD  CALCIUM & MAGNESIUM CARBONATES PO Take 1 tablet by mouth daily.    [provider]  clonazePAM (KLONOPIN) 0.5 MG tablet Take 1 tablet (0.5 mg total) by mouth 2 (two) times daily as needed for anxiety. 10/02/20   Marin Olp, MD  cyclobenzaprine (FLEXERIL) 5 MG tablet Take 1 tablet (5 mg total) by mouth 3 (three) times daily as needed for muscle spasms (dont drive for 6-8 hours after taking). 08/20/19   Marin Olp, MD  fluticasone (FLONASE) 50 MCG/ACT nasal spray Place into both nostrils daily.    [provider]  ibuprofen (ADVIL,MOTRIN) 200 MG tablet Take 800 mg by mouth every 6 (six) hours as needed for moderate pain. Reported on 11/19/2015    [provider]  Multiple Vitamins-Minerals (ONE-A-DAY 50 PLUS PO) Take 1 tablet by mouth.    [provider]  traMADol (ULTRAM) 50 MG tablet Take 1-2 tablets (50-100 mg total) by mouth every 6 (six) hours as needed (mild pain). 10/02/20   Marin Olp, MD    Allergies    Cheese and Vicodin [hydrocodone-acetaminophen]  Review of Systems   Review of Systems  Respiratory: Positive for chest tightness and shortness of breath.   Gastrointestinal: Positive for abdominal pain.  Genitourinary: Negative for dysuria.  Neurological: Positive for light-headedness and numbness.  All other systems  reviewed and are negative.   Physical Exam Updated Vital Signs BP 131/77   Pulse 74   Temp 97.7 F (36.5 C) (Oral)   Resp 18   Ht 5\' 11"  (1.803 m)   Wt 97.5 kg   SpO2 95%   BMI 29.99 kg/m   Physical Exam Vitals and nursing note reviewed.  Constitutional:      General: He is not in acute distress.    Appearance: He is well-developed and well-nourished. He is not ill-appearing or diaphoretic.  HENT:     Head: Normocephalic and atraumatic.     Right Ear: External ear normal.  Left Ear: External ear normal.     Nose: Nose normal.  Eyes:     General:        Right eye: No discharge.        Left eye: No discharge.  Cardiovascular:     Rate and Rhythm: Normal rate and regular rhythm.     Pulses:          Radial pulses are 2+ on the right side and 2+ on the left side.     Heart sounds: Normal heart sounds.  Pulmonary:     Effort: Pulmonary effort is normal.     Breath sounds: Normal breath sounds.  Abdominal:     General: There is no distension.     Palpations: Abdomen is soft.     Tenderness: There is no abdominal tenderness.  Musculoskeletal:        General: No edema.     Cervical back: Neck supple.     Right lower leg: No edema.     Left lower leg: No edema.  Skin:    General: Skin is warm and dry.  Neurological:     Mental Status: He is alert.  Psychiatric:        Mood and Affect: Mood is not anxious.     ED Results / Procedures / Treatments   Labs (all labs ordered are listed, but only abnormal results are displayed) Labs Reviewed  COMPREHENSIVE METABOLIC PANEL - Abnormal; Notable for the following components:      Result Value   CO2 21 (*)    Glucose, Bld 55 (*)    Total Bilirubin 1.3 (*)    All other components within normal limits  CBC WITH DIFFERENTIAL/PLATELET - Abnormal; Notable for the following components:   Hemoglobin 12.4 (*)    HCT 38.0 (*)    RDW 15.8 (*)    Neutro Abs 8.0 (*)    All other components within normal limits  URINALYSIS,  ROUTINE W REFLEX MICROSCOPIC - Abnormal; Notable for the following components:   Color, Urine AMBER (*)    Hgb urine dipstick MODERATE (*)    Ketones, ur 5 (*)    Protein, ur 30 (*)    Nitrite POSITIVE (*)    RBC / HPF >50 (*)    All other components within normal limits  CBG MONITORING, ED - Abnormal; Notable for the following components:   Glucose-Capillary 104 (*)    All other components within normal limits  LIPASE, BLOOD  TROPONIN I (HIGH SENSITIVITY)  TROPONIN I (HIGH SENSITIVITY)    EKG EKG Interpretation  Date/Time:  Friday October 30 2020 19:00:29 EST Ventricular Rate:  71 PR Interval:    QRS Duration: 96 QT Interval:  418 QTC Calculation: 455 R Axis:   32 Text Interpretation: Sinus rhythm Low voltage, precordial leads Abnormal R-wave progression, early transition Confirmed by Sherwood Gambler [phone]) on 10/30/2020 7:05:37 PM   Radiology DG Chest Portable 1 View  Result Date: 10/30/2020 CLINICAL DATA:  Left lower quadrant pain. EXAM: PORTABLE CHEST 1 VIEW COMPARISON:  November 29, 2016 FINDINGS: The heart size and mediastinal contours are within normal limits. Both lungs are clear. The visualized skeletal structures are unremarkable. IMPRESSION: No active disease. Electronically Signed   By: Virgina Norfolk M.D.   On: 10/30/2020 18:52   CT Renal Stone Study  Result Date: 10/30/2020 CLINICAL DATA:  Low back and left-sided pain. EXAM: CT ABDOMEN AND PELVIS WITHOUT CONTRAST TECHNIQUE: Multidetector CT imaging of the abdomen and pelvis was  performed following the standard protocol without IV contrast. COMPARISON:  08/29/2015 FINDINGS: Lower chest: The lung bases are clear of acute process. No pleural effusion or pulmonary lesions. The heart is normal in size. No pericardial effusion. The distal esophagus and aorta are unremarkable. Hepatobiliary: No hepatic lesions or intrahepatic biliary dilatation. The gallbladder is surgically absent. No common bile duct dilatation.  Pancreas: No mass, inflammation or ductal dilatation. Spleen: Normal size.  No focal lesions. Adrenals/Urinary Tract: The adrenal glands are normal. The right kidney is unremarkable. Mild under rotation anomaly but no renal calculi or hydronephrosis. The right ureter is unremarkable. There is a left pelvic kidney which demonstrates moderate hydronephrosis. 7.5 mm intrarenal calculus is noted. There is also a 7 mm calculus at the UPJ causing the hydronephrosis. The remainder of the short left ureter is unremarkable. No bladder calculi or bladder mass. Stomach/Bowel: The stomach, duodenum, small bowel and colon are grossly normal without oral contrast. No inflammatory changes, mass lesions or obstructive findings. The appendix is normal. Vascular/Lymphatic: Scattered aortic calcifications. No mesenteric or retroperitoneal mass or adenopathy. Reproductive: The prostate gland and seminal vesicles are unremarkable. Other: No pelvic mass or adenopathy. No free pelvic fluid collections. No inguinal mass or adenopathy. No abdominal wall hernia or subcutaneous lesions. Musculoskeletal: No significant bony findings. IMPRESSION: 1. 7 mm left UPJ calculus causing moderate grade obstruction of the pelvic kidney. 2. 7.5 mm left renal calculus. 3. No other significant abdominal/pelvic findings, mass lesions or adenopathy. 4. Status post cholecystectomy. No biliary dilatation. Aortic Atherosclerosis (ICD10-I70.0). Electronically Signed   By: Marijo Sanes M.D.   On: 10/30/2020 20:42    Procedures Procedures   Medications Ordered in ED Medications  sodium chloride 0.9 % bolus 1,000 mL (0 mLs Intravenous Stopped 10/30/20 2038)  HYDROmorphone (DILAUDID) injection 1 mg (1 mg Intravenous Given 10/30/20 1840)  ketorolac (TORADOL) 15 MG/ML injection 15 mg (15 mg Intravenous Given 10/30/20 2114)  HYDROmorphone (DILAUDID) tablet 2 mg (2 mg Oral Given 10/30/20 2114)  tamsulosin (FLOMAX) capsule 0.4 mg (0.4 mg Oral Given 10/30/20 2114)     ED Course  I have reviewed the triage vital signs and the nursing notes.  Pertinent labs & imaging results that were available during my care of the patient were reviewed by me and considered in my medical decision making (see chart for details).    MDM Rules/Calculators/A&P                          Patient is feeling significantly better after treatment with IV Dilaudid.  His chest symptoms seem more reactive to the pain he is experiencing.  I doubt significant arrhythmia or ACS/PE.  Troponins are negative x2.  Other labs are fairly unremarkable.  His urine does have positive nitrites but I do not think this is infected given he took Azo earlier today.  CT has been personally reviewed and does show UPJ stone.  This seems to be contributing to his symptoms.  Given that his pain is pretty well controlled I think it is reasonable to discharge him home with close outpatient follow-up with urology.  Given the size of the stone I will also give tamsulosin and have given him a dose of Toradol.  Will prescribe hydromorphone at home for breakthrough pain.  Return precautions given. Final Clinical Impression(s) / ED Diagnoses Final diagnoses:  Obstruction of left ureteropelvic junction (UPJ) due to stone    Rx / DC Orders ED Discharge Orders  Ordered    HYDROmorphone (DILAUDID) 4 MG tablet  Every 4 hours PRN        10/30/20 2155    tamsulosin (FLOMAX) 0.4 MG CAPS capsule  Daily        10/30/20 2155           Sherwood Gambler, MD 10/30/20 2342

## 2020-10-30 NOTE — ED Triage Notes (Signed)
Pt c/o LLQ pain radiating to back that started today- describes it as a sharp pain similar to when he's had kidney stones before. Hx of stones and lithotripsy, see's [facility]. Denies hematuria. C/o chest pressure and "tingling of extremities". Took Azo at home this am. Given 168mcg fentanyl en route

## 2020-11-03 ENCOUNTER — Other Ambulatory Visit: Payer: Self-pay

## 2020-11-03 ENCOUNTER — Encounter (HOSPITAL_COMMUNITY): Payer: Self-pay | Admitting: Urology

## 2020-11-03 ENCOUNTER — Other Ambulatory Visit: Payer: Self-pay | Admitting: Urology

## 2020-11-03 NOTE — Progress Notes (Signed)
COVID Vaccine Completed: Yes Date COVID Vaccine completed: 12/10/19 Has received booster: 01/01/20 COVID vaccine manufacturer: Pfizer     Date of COVID positive in last 90 days: No  PCP - Marin Olp, MD Cardiologist - N/A  Chest x-ray -  10/30/20 in epic EKG - 10/30/20 in epic Stress Test - N/A ECHO - N/A Cardiac Cath - N/A Pacemaker/ICD device last checked: N/A  Sleep Study - N/A CPAP - N/A  Fasting Blood Sugar - N/A Checks Blood Sugar __N/A___ times a day  Blood Thinner Instructions:N/A  Aspirin Instructions: N/A Last Dose: N/A  Activity level:  Able to exercise without symptoms     Anesthesia review: Abnornal R wave progression EKG 10/30/20, history of cerebral aneurysm  Patient denies shortness of breath, fever, cough and chest pain at PAT appointment   Patient verbalized understanding of instructions that were given to them at the PAT appointment. Patient was also instructed that they will need to review over the PAT instructions again at home before surgery.

## 2020-11-04 ENCOUNTER — Other Ambulatory Visit (HOSPITAL_COMMUNITY)
Admission: RE | Admit: 2020-11-04 | Discharge: 2020-11-04 | Disposition: A | Discharge status: Home / Self Care | Payer: BC Managed Care – PPO | Source: Ambulatory Visit | Attending: Urology | Admitting: Urology

## 2020-11-04 DIAGNOSIS — N202 Calculus of kidney with calculus of ureter: Secondary | ICD-10-CM | POA: Diagnosis not present

## 2020-11-04 DIAGNOSIS — Z87442 Personal history of urinary calculi: Secondary | ICD-10-CM | POA: Diagnosis not present

## 2020-11-04 DIAGNOSIS — Z01812 Encounter for preprocedural laboratory examination: Secondary | ICD-10-CM | POA: Insufficient documentation

## 2020-11-04 DIAGNOSIS — Z20822 Contact with and (suspected) exposure to covid-19: Secondary | ICD-10-CM | POA: Insufficient documentation

## 2020-11-04 LAB — SARS CORONAVIRUS 2 (TAT 6-24 HRS): SARS Coronavirus 2: NEGATIVE

## 2020-11-04 NOTE — H&P (Signed)
Office Visit Report     11/02/2020   --------------------------------------------------------------------------------   John Costa  MRN: [id number]  DOB: 1966-03-03, 55 year old Male  SSN: [id number]   PRIMARY CARE:  Brayton Mars. Melanee Spry, MD  REFERRING:    PROVIDER:  Raynelle Bring, M.D.  TREATING:  Jacalyn Lefevre, M.D.  LOCATION:  [facility] [phone]     --------------------------------------------------------------------------------   CC/HPI: cc: left ureteral calculus   11/02/20: 55 year old man with a known left pelvic kidney last seen at [facility] in 2018 who comes in with a 7 mm left obstructing ureteral calculus seen on CT the abdomen pelvis on 10/30/2020. There is an additional nonobstructing left renal calculus as well. Patient's pain is well controlled with 2 mg of Dilaudid. He denies fever, chills, nausea or vomiting. Patient initially presented to the ER with abnormal heart rate and thought he was having a heart attack. That has since subsided and he is doing much better.     ALLERGIES: No Allergies    MEDICATIONS: Sildenafil Citrate 20 mg tablet 1-5 tablet PO PRN as directed  ALPRAZolam 0.25 MG Oral Tablet 0 Oral  Bupropion Hcl Sr 150 mg tablet,sustained-release 12 hr Oral  Hydromorphone Hcl 8 mg tablet Oral  Melatonin TABS Oral  TraMADol HCl - 50 MG Oral Tablet Oral     GU PSH: Cysto Uretero Lithotripsy - 2012 Cystoscopy Insert Stent - 2012, 2012 ESWL - 2012 Ureteroscopic laser litho - 2016       PSH Notes: Cystoscopy Ureteroscopy Lithotripsy Incl Insert Indwelling Ureter Stent, Lithotripsy, Knee Surgery, Anterior Segment Photography With Fluorescein Angiography, Cystoscopy With Insertion Of Ureteral Stent Left, Cystoscopy With Pyeloscopy With Lithotripsy, Cystoscopy With Insertion Of Ureteral Stent Left   NON-GU PSH: No Non-GU PSH    GU PMH: BPH w/LUTS - 2018 ED due to arterial insufficiency - 2018 Weak Urinary Stream  - 2018 ED (drug-induced) - 2017 Renal calculus, Nephrolithiasis - 2016 Scrotal edema, Sperm granuloma - 2015 Ureteral calculus, Calculus of ureter - 2014      PMH Notes:   1) Urolithiasis: He has a left pelvic kidney who I initially evaluated in March 2012 for an obstructing left UPJ calculus. He has a history of calcium oxalate urolithiasis and hyperoxaluria.   Current treatment: Calcium citrate   Prior treatment:  Apr 2012: Left ureteroscopic laser lithotripsy (calcium oxalate monohydrate)  Nov 2012: L ESWL of proximal ureteral stone  Dec 2016: L ureteroscopic laser lithotripsy (calcium oxalate monohydrate)   2) Left pelvic kidney   NON-GU PMH: Gout, Gout - 2014 Depression GERD Rosanna Randy syndrome    FAMILY HISTORY: Father Deceased At Age29 ___ - Runs In Family nephrolithiasis - Sister, Brother Prostate Cancer - Father   SOCIAL HISTORY: Marital Status: Married Preferred Language: English; Ethnicity: Not Hispanic Or Latino; Race: White Current Smoking Status: Patient has never smoked.  Social Drinker.      Notes: Never A Smoker, Occupation:, Marital History - Currently Married, Alcohol Use   REVIEW OF SYSTEMS:    GU Review Male:   Patient reports frequent urination and get up at night to urinate. Patient denies hard to postpone urination, burning/ pain with urination, leakage of urine, stream starts and stops, trouble starting your stream, have to strain to urinate , erection problems, and penile pain.  Gastrointestinal (Upper):   Patient denies nausea, vomiting, and indigestion/ heartburn.  Gastrointestinal (Lower):   Patient denies diarrhea and constipation.  Constitutional:   Patient denies fever, night  sweats, weight loss, and fatigue.  Skin:   Patient denies skin rash/ lesion and itching.  Eyes:   Patient denies blurred vision and double vision.  Ears/ Nose/ Throat:   Patient denies sore throat and sinus problems.  Hematologic/Lymphatic:   Patient denies swollen glands  and easy bruising.  Cardiovascular:   Patient reports chest pains. Patient denies leg swelling.  Respiratory:   Patient denies cough and shortness of breath.  Endocrine:   Patient denies excessive thirst.  Musculoskeletal:   Patient reports back pain. Patient denies joint pain.  Neurological:   Patient denies headaches and dizziness.  Psychologic:   Patient denies depression and anxiety.   Notes: left lower back pain     VITAL SIGNS:      11/02/2020 01:19 PM  Weight 215.8 lb / 97.89 kg  Height 70 in / 177.8 cm  BP 133/90 mmHg  Pulse 98 /min  Temperature 97.1 F / 36.1 C  BMI 31.0 kg/m   MULTI-SYSTEM PHYSICAL EXAMINATION:    Constitutional: Well-nourished. No physical deformities. Normally developed. Good grooming.  Neck: Neck symmetrical, not swollen. Normal tracheal position.  Respiratory: No labored breathing, no use of accessory muscles.   Skin: No paleness, no jaundice, no cyanosis. No lesion, no ulcer, no rash.  Neurologic / Psychiatric: Oriented to time, oriented to place, oriented to person. No depression, no anxiety, no agitation.  Gastrointestinal: No rigidity  Eyes: Normal conjunctivae. Normal eyelids.  Ears, Nose, Mouth, and Throat: Left ear no scars, no lesions, no masses. Right ear no scars, no lesions, no masses. Nose no scars, no lesions, no masses. Normal hearing. Normal lips.  Musculoskeletal: Normal gait and station of head and neck.     Complexity of Data:  X-Ray Review: C.T. Abdomen/Pelvis: Reviewed Films. Discussed With Patient.     PROCEDURES:          Urinalysis w/Scope Dipstick Dipstick Cont'd Micro  Color: Yellow Bilirubin: Neg mg/dL WBC/hpf: 0 - 5/hpf  Appearance: Clear Ketones: Neg mg/dL RBC/hpf: 20 - 40/hpf  Specific Gravity: 1.025 Blood: 3+ ery/uL Bacteria: Few (10-25/hpf)  pH: 5.5 Protein: Trace mg/dL Cystals: NS (Not Seen)  Glucose: Neg mg/dL Urobilinogen: 1.0 mg/dL Casts: NS (Not Seen)    Nitrites: Neg Trichomonas: Not Present    Leukocyte  Esterase: Trace leu/uL Mucous: Present      Epithelial Cells: NS (Not Seen)      Yeast: NS (Not Seen)      Sperm: Not Present    ASSESSMENT:      ICD-10 Details  1 GU:   Renal calculus - N20.0 Chronic, Stable  2   Ureteral calculus - D35.7 Acute, Uncomplicated - Discussed with patient CT findings which show 7 mm left UPJ calculus as well as nonobstructing 7 mm stone present in the left kidney. Patient states that he did not respond well to ESWL in the past. Per chart review he has had ureteroscopy in 2016 with Dr. Alinda Money. Patient without infectious parameters and pain well controlled. Will discuss management of stones with Dr. Alinda Money and set patient up for surgery.   PLAN:            Medications New Meds: Cephalexin 500 mg capsule 1 capsule PO QID   #28  0 Refill(s)            Orders Labs CULTURE, URINE          Document Letter(s):  Created for Patient: Clinical Summary         Notes:  After discussing patient's case with Dr. Alinda Money, patient has decided to move forward with ureteroscopy with laser lithotripsy and stent placement. We discussed how the surgery is performed and about possible risks including but not limited to bleeding, pain, stent discomfort, infection, damage to surrounding structures, need for staged procedure, inability remove all the stones. Patient also be started on an antibiotic as there was some bacteria in his urine and urine will be sent for culture today. He will be contacted with his surgery date.    cc: Arther Abbott, MD  cc: Dutch Gray, MD    * Signed by Jacalyn Lefevre, M.D. on 11/02/20 at 4:42 PM (EST)*

## 2020-11-05 ENCOUNTER — Ambulatory Visit (HOSPITAL_COMMUNITY): Payer: BC Managed Care – PPO | Admitting: Physician Assistant

## 2020-11-05 ENCOUNTER — Encounter: Payer: Self-pay | Admitting: Family Medicine

## 2020-11-05 ENCOUNTER — Ambulatory Visit (HOSPITAL_COMMUNITY): Payer: BC Managed Care – PPO

## 2020-11-05 ENCOUNTER — Encounter (HOSPITAL_COMMUNITY): Payer: Self-pay | Admitting: Urology

## 2020-11-05 ENCOUNTER — Ambulatory Visit (HOSPITAL_COMMUNITY)
Admission: RE | Admit: 2020-11-05 | Discharge: 2020-11-05 | Disposition: A | Discharge status: Home / Self Care | Payer: BC Managed Care – PPO | Source: Ambulatory Visit | Attending: Urology | Admitting: Urology

## 2020-11-05 ENCOUNTER — Encounter (HOSPITAL_COMMUNITY)
Admission: RE | Disposition: A | Discharge status: Home / Self Care | Payer: Self-pay | Source: Ambulatory Visit | Attending: Urology

## 2020-11-05 DIAGNOSIS — Z87442 Personal history of urinary calculi: Secondary | ICD-10-CM | POA: Insufficient documentation

## 2020-11-05 DIAGNOSIS — Z20822 Contact with and (suspected) exposure to covid-19: Secondary | ICD-10-CM | POA: Insufficient documentation

## 2020-11-05 DIAGNOSIS — N202 Calculus of kidney with calculus of ureter: Secondary | ICD-10-CM | POA: Diagnosis not present

## 2020-11-05 HISTORY — PX: CYSTOSCOPY WITH RETROGRADE PYELOGRAM, URETEROSCOPY AND STENT PLACEMENT: SHX5789

## 2020-11-05 HISTORY — PX: HOLMIUM LASER APPLICATION: SHX5852

## 2020-11-05 SURGERY — CYSTOURETEROSCOPY, WITH RETROGRADE PYELOGRAM AND STENT INSERTION
Anesthesia: General | Laterality: Left

## 2020-11-05 MED ORDER — ACETAMINOPHEN 10 MG/ML IV SOLN: 1000.0000 mg | Freq: Once | INTRAVENOUS | Status: DC | PRN

## 2020-11-05 MED ORDER — LIDOCAINE HCL (PF) 2 % IJ SOLN
INTRAMUSCULAR | Status: AC
  Filled 2020-11-05: qty 5

## 2020-11-05 MED ORDER — SCOPOLAMINE 1 MG/3DAYS TD PT72
MEDICATED_PATCH | TRANSDERMAL | Status: AC
  Filled 2020-11-05: qty 1

## 2020-11-05 MED ORDER — LIDOCAINE HCL 1 % IJ SOLN
INTRAMUSCULAR | Status: DC | PRN
  Administered 2020-11-05: 50 mg via INTRADERMAL

## 2020-11-05 MED ORDER — HYDROMORPHONE HCL 2 MG PO TABS: 2.0000 mg | ORAL_TABLET | Freq: Two times a day (BID) | ORAL | 0 refills | Status: AC | PRN

## 2020-11-05 MED ORDER — EPHEDRINE SULFATE 50 MG/ML IJ SOLN
INTRAMUSCULAR | Status: DC | PRN
  Administered 2020-11-05 (×2): 10 mg via INTRAVENOUS

## 2020-11-05 MED ORDER — IOHEXOL 300 MG/ML  SOLN
INTRAMUSCULAR | Status: DC | PRN
  Administered 2020-11-05: 6 mL
  Administered 2020-11-05: 14 mL
  Administered 2020-11-05: 7 mL

## 2020-11-05 MED ORDER — PROPOFOL 10 MG/ML IV BOLUS
INTRAVENOUS | Status: AC
  Filled 2020-11-05: qty 20

## 2020-11-05 MED ORDER — CHLORHEXIDINE GLUCONATE 0.12 % MT SOLN
15.0000 mL | Freq: Once | OROMUCOSAL | Status: AC
  Administered 2020-11-05: 15 mL via OROMUCOSAL

## 2020-11-05 MED ORDER — DEXAMETHASONE SODIUM PHOSPHATE 10 MG/ML IJ SOLN
INTRAMUSCULAR | Status: AC
  Filled 2020-11-05: qty 1

## 2020-11-05 MED ORDER — MIDAZOLAM HCL 2 MG/2ML IJ SOLN
INTRAMUSCULAR | Status: AC
  Filled 2020-11-05: qty 2

## 2020-11-05 MED ORDER — AMISULPRIDE (ANTIEMETIC) 5 MG/2ML IV SOLN: 10.0000 mg | Freq: Once | INTRAVENOUS | Status: DC | PRN

## 2020-11-05 MED ORDER — PROPOFOL 10 MG/ML IV BOLUS
INTRAVENOUS | Status: DC | PRN
  Administered 2020-11-05: 20 mg via INTRAVENOUS
  Administered 2020-11-05: 10 mg via INTRAVENOUS
  Administered 2020-11-05: 200 mg via INTRAVENOUS

## 2020-11-05 MED ORDER — EPHEDRINE 5 MG/ML INJ
INTRAVENOUS | Status: AC
  Filled 2020-11-05: qty 10

## 2020-11-05 MED ORDER — CEFAZOLIN SODIUM-DEXTROSE 2-4 GM/100ML-% IV SOLN
2.0000 g | INTRAVENOUS | Status: AC
  Administered 2020-11-05: 2 g via INTRAVENOUS
  Filled 2020-11-05: qty 100

## 2020-11-05 MED ORDER — FENTANYL CITRATE (PF) 100 MCG/2ML IJ SOLN
INTRAMUSCULAR | Status: AC
  Filled 2020-11-05: qty 2

## 2020-11-05 MED ORDER — FENTANYL CITRATE (PF) 100 MCG/2ML IJ SOLN
INTRAMUSCULAR | Status: DC | PRN
  Administered 2020-11-05 (×2): 50 ug via INTRAVENOUS

## 2020-11-05 MED ORDER — ORAL CARE MOUTH RINSE: 15.0000 mL | Freq: Once | OROMUCOSAL | Status: AC

## 2020-11-05 MED ORDER — ONDANSETRON HCL 4 MG/2ML IJ SOLN
INTRAMUSCULAR | Status: AC
  Filled 2020-11-05: qty 2

## 2020-11-05 MED ORDER — MIDAZOLAM HCL 5 MG/5ML IJ SOLN
INTRAMUSCULAR | Status: DC | PRN
  Administered 2020-11-05: 2 mg via INTRAVENOUS

## 2020-11-05 MED ORDER — SODIUM CHLORIDE 0.9 % IR SOLN
Status: DC | PRN
  Administered 2020-11-05: 3000 mL

## 2020-11-05 MED ORDER — SCOPOLAMINE 1 MG/3DAYS TD PT72
1.0000 | MEDICATED_PATCH | TRANSDERMAL | Status: DC
  Administered 2020-11-05: 1.5 mg via TRANSDERMAL

## 2020-11-05 MED ORDER — FENTANYL CITRATE (PF) 100 MCG/2ML IJ SOLN: 25.0000 ug | INTRAMUSCULAR | Status: DC | PRN

## 2020-11-05 MED ORDER — LACTATED RINGERS IV SOLN: INTRAVENOUS | Status: DC

## 2020-11-05 MED ORDER — PROMETHAZINE HCL 25 MG/ML IJ SOLN: 6.2500 mg | INTRAMUSCULAR | Status: DC | PRN

## 2020-11-05 SURGICAL SUPPLY — 25 items
BAG URO CATCHER STRL LF (MISCELLANEOUS) ×2 IMPLANT
BASKET ZERO TIP NITINOL 2.4FR (BASKET) ×1 IMPLANT
BSKT STON RTRVL ZERO TP 2.4FR (BASKET) ×1
CATH INTERMIT  6FR 70CM (CATHETERS) IMPLANT
CLOTH BEACON ORANGE TIMEOUT ST (SAFETY) ×2 IMPLANT
COVER SURGICAL LIGHT HANDLE (MISCELLANEOUS) ×2 IMPLANT
COVER WAND RF STERILE (DRAPES) IMPLANT
GLOVE SURG ENC TEXT LTX SZ7.5 (GLOVE) ×2 IMPLANT
GOWN STRL REUS W/TWL LRG LVL3 (GOWN DISPOSABLE) ×2 IMPLANT
GUIDEWIRE STR DUAL SENSOR (WIRE) ×2 IMPLANT
GUIDEWIRE ZIPWRE .038 STRAIGHT (WIRE) IMPLANT
IV NS 1000ML (IV SOLUTION) ×2
IV NS 1000ML BAXH (IV SOLUTION) ×1 IMPLANT
KIT TURNOVER KIT A (KITS) ×2 IMPLANT
LASER FIB FLEXIVA PULSE ID 365 (Laser) IMPLANT
MANIFOLD NEPTUNE II (INSTRUMENTS) ×2 IMPLANT
PACK CYSTO (CUSTOM PROCEDURE TRAY) ×2 IMPLANT
PENCIL SMOKE EVACUATOR (MISCELLANEOUS) IMPLANT
SHEATH URETERAL 12FRX28CM (UROLOGICAL SUPPLIES) ×1 IMPLANT
SHEATH URETERAL 12FRX35CM (MISCELLANEOUS) IMPLANT
STENT URET 6FRX22 CONTOUR (STENTS) ×1 IMPLANT
TRACTIP FLEXIVA PULS ID 200XHI (Laser) IMPLANT
TRACTIP FLEXIVA PULSE ID 200 (Laser) ×2
TUBING CONNECTING 10 (TUBING) ×2 IMPLANT
TUBING UROLOGY SET (TUBING) ×2 IMPLANT

## 2020-11-05 NOTE — Transfer of Care (Signed)
Immediate Anesthesia Transfer of Care Note  Patient: John Costa  Procedure(s) Performed: CYSTOSCOPY WITH RETROGRADE PYELOGRAM, URETEROSCOPY AND STENT PLACEMENT (Left ) HOLMIUM LASER APPLICATION (Left )  Patient Location: PACU  Anesthesia Type:General  Level of Consciousness: awake, alert , oriented and patient cooperative  Airway & Oxygen Therapy: Patient Spontanous Breathing, Patient connected to nasal cannula oxygen and Patient connected to face mask oxygen  Post-op Assessment: Report given to RN and Post -op Vital signs reviewed and stable  Post vital signs: Reviewed and stable  Last Vitals:  Vitals Value Taken Time  BP    Temp    Pulse    Resp    SpO2      Last Pain:  Vitals:   11/05/20 1150  TempSrc:   PainSc: 4       Patients Stated Pain Goal: 1 (30/14/84 0397)  Complications: No complications documented.

## 2020-11-05 NOTE — Interval H&P Note (Signed)
History and Physical Interval Note:  11/05/2020 12:40 PM  John Costa  has presented today for surgery, with the diagnosis of LEFT URETERAL AND RENAL CALCULI.  The various methods of treatment have been discussed with the patient and family. After consideration of risks, benefits and other options for treatment, the patient has consented to  Procedure(s) with comments: CYSTOSCOPY WITH RETROGRADE PYELOGRAM, URETEROSCOPY AND STENT PLACEMENT (Left) - 59 MINS HOLMIUM LASER APPLICATION (Left) as a surgical intervention.  The patient's history has been reviewed, patient examined, no change in status, stable for surgery.  I have reviewed the patient's chart and labs.  Questions were answered to the patient's satisfaction.     [employer]

## 2020-11-05 NOTE — Op Note (Signed)
Preoperative diagnosis: 1.  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  Cystoscopy 2.  Retrograde pyelography with interpretation 3.  Left ureteroscopy with laser lithotripsy and stone removal 4.  Left ureteral stent placement (6 x 22 -with string)  Surgeon: Roxy Horseman, Brooke Bonito MD  Anesthesia: General  Complications: None  EBL: Minimal  Specimen: Left renal calculus  Disposition of specimen: [facility]  Intraoperative findings: Left retrograde pyelography was performed with a 6 French ureteral catheter and Omnipaque contrast.  This demonstrated a normal caliber ureter without filling defects.  The patient was noted to have a pelvic kidney as previously noted on preoperative imaging.  The ureter was somewhat tortuous deviating medially with malrotation of the kidney as expected with a pelvic kidney.  There was noted to be some dilated lower pole calyces that may have been chronic or possibly related to his recent obstruction.  No obvious filling defects were noted within the kidney itself.  Indication: This is a 55 year old gentleman with a history of urolithiasis and a known left pelvic kidney.  He recently presented with pain symptoms and was noted on CT imaging to have an obstructing 7 mm left UPJ calculus.  There was also evidence of a left 7 mm lower pole renal calculus.  After reviewing options for treatment, he elected to proceed with the above procedures.  The potential risks, complications, and expected recovery process was discussed in detail.  Informed consent was obtained.  Description of procedure: The patient was taken the operating room and a general anesthetic was administered.  He was given preoperative antibiotics, placed in the dorsolithotomy position, and prepped and draped in the usual sterile fashion.  Next, a preoperative timeout was  performed.  Cystourethroscopy was then performed with a 22 French cystoscope.  Inspection the bladder revealed mild trabeculation without evidence of mucosal abnormalities, stones, or bladder tumors.  Attention then turned to the left ureteral orifice.  Using a 6 [place] ureteral catheter, Omnipaque contrast was injected with findings as dictated above.  A 0.38 sensor guidewire was then advanced up into the renal collecting system under fluoroscopic guidance.  An attempt was made to perform semirigid ureteroscopy with a 6 French semirigid ureteroscope.  This was able to be advanced into the proximal ureter although the ureter then deviated medially and was unable to be navigated with the semirigid scope.  No stones were seen up to this point.  A 12/14 ureteral access sheath was then advanced over the wire up into the proximal ureter.  Digital flexible ureteroscopy was performed to evaluate the proximal ureter.  No stones were seen.  The renal collecting system was then entered.  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  The upper pole system was first examined and there was noted to be a stone in the upper pole likely consistent with the UPJ stone noted on preoperative imaging that had been pushed up into the kidney.  No other calculi were identified here.  The lower pole system was then also evaluated.  No calculi were identified.  I reinjected Omnipaque contrast and confirmed that all calyces were directly visualized.  The ureteroscope was then withdrawn and the wire was left in place.  The access sheath was also removed.  The wire was then backloaded on the cystoscope.  A 6 x 22 double-J ureteral stent was then advanced over the  wire using Seldinger technique and positioned appropriately under fluoroscopic and cystoscopic guidance.  The wire was then removed with a good curl noted in the renal pelvis as well as in the bladder.  A string tether was left in place.  The patient tolerated  the procedure well without complications.  He was able to be awakened and transferred to recovery unit in satisfactory condition.

## 2020-11-05 NOTE — Anesthesia Procedure Notes (Signed)
Procedure Name: LMA Insertion Date/Time: 11/05/2020 1:14 PM Performed by: Garrel Ridgel, CRNA Pre-anesthesia Checklist: Patient identified, Emergency Drugs available, Suction available and Patient being monitored Patient Re-evaluated:Patient Re-evaluated prior to induction Oxygen Delivery Method: Circle system utilized Preoxygenation: Pre-oxygenation with 100% oxygen Induction Type: IV induction Ventilation: Mask ventilation without difficulty LMA: LMA inserted LMA Size: 4.0 Number of attempts: 1 Placement Confirmation: positive ETCO2 Tube secured with: Tape Dental Injury: Teeth and Oropharynx as per pre-operative assessment

## 2020-11-05 NOTE — Discharge Instructions (Signed)
1. You may see some blood in the urine and may have some burning with urination for 48-72 hours. You also may notice that you have to urinate more frequently or urgently after your procedure which is normal.  2. You should call should you develop an inability urinate, fever > 101, persistent nausea and vomiting that prevents you from eating or drinking to stay hydrated.  3. If you have a stent, you will likely urinate more frequently and urgently until the stent is removed and you may experience some discomfort/pain in the lower abdomen and flank especially when urinating. You may take pain medication prescribed to you if needed for pain. You may also intermittently have blood in the urine until the stent is removed. PULL STRING Monday Morning!

## 2020-11-05 NOTE — Anesthesia Preprocedure Evaluation (Addendum)
Anesthesia Evaluation  Patient identified by MRN, date of birth, ID band Patient awake    Reviewed: Allergy & Precautions, NPO status , Patient's Chart, lab work & pertinent test results  History of Anesthesia Complications (+) PONV  Airway Mallampati: II  TM Distance: >3 FB Neck ROM: Full    Dental  (+) Teeth Intact   Pulmonary neg pulmonary ROS,    Pulmonary exam normal        Cardiovascular hypertension, Pt. on medications  Rhythm:Regular Rate:Normal     Neuro/Psych  Headaches, Anxiety Depression Cerebral aneurysm repair 2008    GI/Hepatic Neg liver ROS, GERD  ,  Endo/Other  negative endocrine ROS  Renal/GU Renal diseaseLeft ureteral and renal calculi  negative genitourinary   Musculoskeletal negative musculoskeletal ROS (+)   Abdominal (+)  Abdomen: soft. Bowel sounds: normal.  Peds  Hematology negative hematology ROS (+)   Anesthesia Other Findings   Reproductive/Obstetrics                             Anesthesia Physical Anesthesia Plan  ASA: II  Anesthesia Plan: General   Post-op Pain Management:    Induction: Intravenous  PONV Risk Score and Plan: 3 and Ondansetron, Dexamethasone, Midazolam, Treatment may vary due to age or medical condition, Amisulpride and Scopolamine patch - Pre-op  Airway Management Planned: Mask and LMA  Additional Equipment: None  Intra-op Plan:   Post-operative Plan: Extubation in OR  Informed Consent: I have reviewed the patients History and Physical, chart, labs and discussed the procedure including the risks, benefits and alternatives for the proposed anesthesia with the patient or authorized representative who has indicated his/her understanding and acceptance.     Dental advisory given  Plan Discussed with: CRNA  Anesthesia Plan Comments: (Lab Results      Component                Value               Date                       WBC                      10.0                10/30/2020                HGB                      12.4 (L)            10/30/2020                HCT                      38.0 (L)            10/30/2020                MCV                      85.0                10/30/2020                PLT  260                 10/30/2020           Lab Results      Component                Value               Date                      NA                       140                 10/30/2020                K                        3.7                 10/30/2020                CO2                      21 (L)              10/30/2020                GLUCOSE                  55 (L)              10/30/2020                BUN                      18                  10/30/2020                CREATININE               1.08                10/30/2020                CALCIUM                  9.2                 10/30/2020                GFRNONAA                 >60                 10/30/2020                GFRAA                    >60                 11/21/2017          )       Anesthesia Quick Evaluation

## 2020-11-06 ENCOUNTER — Encounter (HOSPITAL_COMMUNITY): Payer: Self-pay | Admitting: Urology

## 2020-11-06 NOTE — Anesthesia Postprocedure Evaluation (Signed)
Anesthesia Post Note  Patient: TIVON LEMOINE CONNELL  Procedure(s) Performed: CYSTOSCOPY WITH RETROGRADE PYELOGRAM, URETEROSCOPY AND STENT PLACEMENT (Left ) HOLMIUM LASER APPLICATION (Left )     Patient location during evaluation: PACU Anesthesia Type: General Level of consciousness: awake and alert Pain management: pain level controlled Vital Signs Assessment: post-procedure vital signs reviewed and stable Respiratory status: spontaneous breathing, nonlabored ventilation and respiratory function stable Cardiovascular status: blood pressure returned to baseline and stable Postop Assessment: no apparent nausea or vomiting Anesthetic complications: no   No complications documented.  Last Vitals:  Vitals:   11/05/20 1505 11/05/20 1530  BP: (!) 153/90 (!) 146/83  Pulse: 89 94  Resp: 16 18  Temp: 36.6 C   SpO2: 95% 93%    Last Pain:  Vitals:   11/05/20 1530  TempSrc:   PainSc: 3                  Graciana Sessa,W. EDMOND

## 2020-12-02 ENCOUNTER — Other Ambulatory Visit: Payer: Self-pay | Admitting: Family Medicine

## 2021-01-25 ENCOUNTER — Other Ambulatory Visit: Payer: Self-pay

## 2021-01-25 ENCOUNTER — Encounter: Payer: Self-pay | Admitting: Family Medicine

## 2021-01-25 ENCOUNTER — Ambulatory Visit: Payer: BC Managed Care – PPO | Admitting: Family Medicine

## 2021-01-25 VITALS — BP 134/86 | HR 98 | Temp 98.0°F | Ht 70.0 in | Wt 222.4 lb

## 2021-01-25 DIAGNOSIS — I10 Essential (primary) hypertension: Secondary | ICD-10-CM

## 2021-01-25 DIAGNOSIS — M79662 Pain in left lower leg: Secondary | ICD-10-CM

## 2021-01-25 DIAGNOSIS — M7021 Olecranon bursitis, right elbow: Secondary | ICD-10-CM | POA: Diagnosis not present

## 2021-01-25 MED ORDER — IBUPROFEN 800 MG PO TABS: 800.0000 mg | ORAL_TABLET | Freq: Three times a day (TID) | ORAL | 0 refills | Status: DC | PRN

## 2021-01-25 NOTE — Patient Instructions (Addendum)
Health Maintenance Due  Topic Date Due  . COLONOSCOPY -  Will call back and get this rescheduled.   [facility] contact Address: [address], [place], [place] [address] Phone: [phone] 12/16/2020   We will call you within 3 days about your referral to Dr. Georgina Snell. If you do not hear within 3 days, give them a call.  - Phone: [phone] [facility]  Ibuprofen 800mg  refilled for bursitis   Recommended follow up: Return for as needed for new, worsening, persistent symptoms. seek immediate care if shortness of breath or significant worsening of pain or size of lump on calf

## 2021-01-25 NOTE — Progress Notes (Signed)
Phone [phone] In person visit   Subjective:   John Costa is a 55 y.o. year old very pleasant male patient who presents for/with See problem oriented charting Chief Complaint  Patient presents with  . Mass    Lump on the back of his Left calf. Noticed it on the 8th of April. Only tender to the touch.     This visit occurred during the SARS-CoV-2 public health emergency.  Safety protocols were in place, including screening questions prior to the visit, additional usage of staff PPE, and extensive cleaning of exam room while observing appropriate contact time as indicated for disinfecting solutions.   Past Medical History-  Patient Active Problem List   Diagnosis Date Noted  . Aneurysm, carotid artery, internal 02/18/2011    Priority: High  . Hypertension 05/21/2017    Priority: Medium  . History of adenomatous polyp of colon 12/25/2015    Priority: Medium  . Panic attacks 11/26/2014    Priority: Medium  . Depression 03/14/2011    Priority: Medium  . Gout 06/12/2007    Priority: Medium  . Gangrenous cholecystitis s/p lap cholecystectomy 11/19/2017 11/18/2017    Priority: Low  . Allergic rhinitis 11/26/2014    Priority: Low  . Skin lesion 11/26/2014    Priority: Low  . Nephrolithiasis 09/19/2010    Priority: Low  . Pityriasis rosea 01/21/2008    Priority: Low  . Gilbert's disease 06/12/2007    Priority: Low    Medications- reviewed and updated Current Outpatient Medications  Medication Sig Dispense Refill  . acetaminophen (TYLENOL) 500 MG tablet Take 2 tablets (1,000 mg total) by mouth 3 (three) times daily. (Patient taking differently: Take 1,000 mg by mouth every 8 (eight) hours as needed for moderate pain or mild pain.) 30 tablet 0  . allopurinol (ZYLOPRIM) 100 MG tablet TAKE 1 TABLET BY MOUTH TWICE A DAY (Patient taking differently: Take 100 mg by mouth 2 (two) times daily.) 180 tablet 3  . amLODipine (NORVASC) 5 MG tablet TAKE 1 TABLET BY MOUTH EVERY DAY  90 tablet 3  . CALCIUM & MAGNESIUM CARBONATES PO Take 1 tablet by mouth daily.    . clonazePAM (KLONOPIN) 0.5 MG tablet Take 1 tablet (0.5 mg total) by mouth 2 (two) times daily as needed for anxiety. 50 tablet 0  . cyclobenzaprine (FLEXERIL) 5 MG tablet Take 1 tablet (5 mg total) by mouth 3 (three) times daily as needed for muscle spasms (dont drive for 6-8 hours after taking). 20 tablet 0  . fluticasone (FLONASE) 50 MCG/ACT nasal spray Place 2 sprays into both nostrils daily.    Marland Kitchen ibuprofen (ADVIL) 800 MG tablet Take 1 tablet (800 mg total) by mouth every 8 (eight) hours as needed for moderate pain (for olecranon bursitis). 30 tablet 0  . L-THEANINE PO Take 1 tablet by mouth daily.    . Multiple Vitamins-Minerals (ONE-A-DAY 50 PLUS PO) Take 1 tablet by mouth.    Marland Kitchen omeprazole (PRILOSEC) 20 MG capsule Take 20 mg by mouth daily.    . Potassium 99 MG TABS Take 99 mg by mouth daily.     No current facility-administered medications for this visit.     Objective:  BP 134/86   Pulse 98   Temp 98 F (36.7 C) (Temporal)   Ht 5\' 10"  (1.778 m)   Wt 222 lb 6.4 oz (100.9 kg) Comment: patient reported  SpO2 97%   BMI 31.91 kg/m  Gen: NAD, resting comfortably CV: RRR no murmurs rubs  or gallops Lungs: CTAB no crackles, wheeze, rhonchi Ext: Trace edema Skin: warm, dry MSK: From swelling and tenderness over left olecranon bursa-some excess tissue noted. On left calf patient with horizontal band/subcutaneous lesion/mass at least 2 x 8 cm    Assessment and Plan   # Left calf pain S:patient noted lump on back of his left calf on April 8th. No pain at first- noted small growth around that time. Only tender with palpation- like a burned feeling- did take some motrin. Started out circular and now more in a band. No trauma to area  Had been up with mom and was in seated position a lot more regularly in a small room - was sitting for 5 days in hospital with her- was there when he noticed it- leg could  have bene in a certain position on the chair potentially.  A/P: Patient with left calf subcutaneous lesion/growth over the last month associated with pain with palpation.  Started out almost circular and now bandlike at least 2 x 8 cm horizontally.  I strongly doubt something like DVT.  Did give him precautions for pulmonary embolism.  Considered vascular ultrasound but I think likely low yield.  I think starting with musculoskeletal ultrasound is likely a more advisable step.  Patient may need advanced imaging.  I would like opinion on both of these (ultrasound and advanced imaging) from sports medicine- referral was placed today-hopefully he can get in this week with Dr. Georgina Snell  #Olecranon bursitis S: Patient with history of elbow injury and later olecranon bursitis-he has continued to have intermittent pain in the area even after healing.  Also has some lingering excess tissue.  Patient was in a seated position for 5 days with his mother and arm was on a chair pretty regularly without his compression sleeve that he would normally bring with him.  He also had a long drive back and forth from [place].  This seems to have irritated the bursa-has been using ibuprofen 800 mg with some relief A/P: Recurrent olecranon bursitis- patient will resume using compression.  We will refill his ibuprofen 800 mg- recommended he watch blood pressure when he is on this medicine  #hypertension S: medication: amlodipine 5 mg BP Readings from Last 3 Encounters:  01/25/21 134/86  11/05/20 (!) 146/83  10/30/20 131/77  A/P: Blood pressure well controlled but with him starting ibuprofen 100 mg I want him to monitor blood pressure closely-for now continue current medication  Recommended follow up: Return for as needed for new, worsening, persistent symptoms.  Lab/Order associations:   ICD-10-CM   1. Pain of left calf  M79.662 Ambulatory referral to Sports Medicine  2. Primary hypertension  I10   3. Olecranon bursitis  of right elbow  M70.21     Meds ordered this encounter  Medications  . ibuprofen (ADVIL) 800 MG tablet    Sig: Take 1 tablet (800 mg total) by mouth every 8 (eight) hours as needed for moderate pain (for olecranon bursitis).    Dispense:  30 tablet    Refill:  0   Return precautions advised.  Garret Reddish, MD

## 2021-01-26 ENCOUNTER — Ambulatory Visit (INDEPENDENT_AMBULATORY_CARE_PROVIDER_SITE_OTHER): Payer: BC Managed Care – PPO | Admitting: Family Medicine

## 2021-01-26 ENCOUNTER — Ambulatory Visit: Payer: Self-pay

## 2021-01-26 VITALS — BP 142/88 | HR 99 | Ht 70.0 in | Wt 222.4 lb

## 2021-01-26 DIAGNOSIS — M79662 Pain in left lower leg: Secondary | ICD-10-CM | POA: Diagnosis not present

## 2021-01-26 NOTE — Patient Instructions (Addendum)
Thank you for coming in today.  You should hear soon about Ultrasound. (Vascular ultrasound)  If that is normal next step is MRI.   Please get an Xray today before you leave  Compression stocking and Voltaren gel may help.  Please use Voltaren gel (Generic Diclofenac Gel) up to 4x daily for pain as needed.  This is available over-the-counter as both the name brand Voltaren gel and the generic diclofenac gel.    Thrombophlebitis Thrombophlebitis is a condition in which a blood clot forms in a vein. This can happen in your arms or legs, or in the area between your neck and groin (torso). When this condition happens in a vein that is close to the surface of the body (superficial thrombophlebitis), it is usually not serious.However, when the condition happens in a vein that is deep inside the body (deep vein thrombosis, DVT), it can cause serious problems. What are the causes? This condition may be caused by:  Damage to a vein.  Inflammation of the veins.  A condition that causes blood to clot more easily.  Reduced blood flow through the veins. What increases the risk? The following factors may make you more likely to develop this condition:  Having a condition that makes blood thicker or more likely to clot.  Having an infection.  Having major surgery.  Experiencing a traumatic injury or a broken bone.  Having a catheter in a vein (central line).  Having a condition in which valves in the veins do not work properly, causing blood to collect (pool) in the veins (chronic venous insufficiency).  An inactive (sedentary) lifestyle.  Pregnancy or having recently given birth.  Cancer.  Older age, especially being 15 or older.  Obesity.  Smoking.  Taking medicines that contain estrogen, such as birth control pills.  Having varicose veins.  Using drugs that are injected into the veins (intravenous, IV). What are the signs or symptoms? The main symptoms of this condition  are:  Swelling and pain in an arm or leg. If the affected vein is in the leg, you may feel pain while standing or walking.  Warmth or redness in an arm or leg. Other symptoms include:  Low-grade fever.  Muscle aches.  A bulging vein (venous distension). In some cases, there are no symptoms. How is this diagnosed? This condition may be diagnosed based on:  Your symptoms and medical history.  A physical exam.  Tests, such as: ? Blood tests. ? A test that uses sound waves to make images (ultrasound). How is this treated? Treatment depends on how severe the condition is and which area of the body is affected. Treatment may include:  Applying a warm compress or heating pad to affected areas.  Wearing compression stockings to help prevent blood clots and reduce swelling in your legs.  Raising (elevating) the affected arm or leg above the level of your heart.  Medicines, such as: ? Anti-inflammatory medicines, such as ibuprofen. ? Blood thinners (anticoagulants), such as heparin. ? Antibiotic medicine, if you have an infection.  Removing an IV that may be causing the problem. In rare cases, surgery may be needed to:  Remove a damaged section of a vein.  Place a filter in a large vein to catch blood clots before they reach the lungs. Follow these instructions at home: Medicines  Take over-the-counter and prescription medicines only as told by your health care provider.  If you were prescribed an antibiotic, take it as told by your health care provider.  Do not stop using the antibiotic even if you feel better. Managing pain, stiffness, and swelling  If directed, put heat on the affected area as often as told by your health care provider. Use the heat source that your health care provider recommends, such as a moist heat pack or a heating pad. ? Place a towel between your skin and the heat source. ? Leave the heat on for 20-30 minutes. ? Remove the heat if your skin turns  bright red. This is especially important if you are not able to feel pain, heat, or cold. You may have a greater risk of getting burned.  Elevate the affected area above the level of your heart while you are sitting or lying down.   Activity  Return to your normal activities as told by your health care provider. Ask your health care provider what activities are safe for you.  Avoid sitting or lying down for long periods. If possible, stand up and walk around regularly. If you are taking blood thinners:  Take your medicine exactly as told, at the same time every day.  Avoid activities that could cause injury or bruising, and follow instructions about how to prevent falls.  Wear a medical alert bracelet or carry a card that lists what medicines you take. General instructions  Drink enough fluid to keep your urine pale yellow.  Wear compression stockings as told by your health care provider.  Do not use any products that contain nicotine or tobacco, such as cigarettes and e-cigarettes. If you need help quitting, ask your health care provider.  Keep all follow-up visits as told by your health care provider. This is important. Contact a health care provider if:  You miss a dose of your blood thinner, if applicable.  Your symptoms do not improve.  You have unusual bruising.  You have nausea, vomiting, or diarrhea that lasts for more than one day. Get help right away if:  You have any of these problems: ? New or worse pain, swelling, or redness in an arm or leg. ? Numbness or tingling in an arm or leg. ? Shortness of breath. ? Chest pain. ? Severe pain in your abdomen. ? Fast breathing. ? A fast or irregular heartbeat. ? Blood in your vomit, stool, or urine. ? A severe headache or confusion. ? A cut that does not stop bleeding.  You feel light-headed or dizzy.  You cough up blood.  You have a serious fall or accident, or you hit your head. These symptoms may represent a  serious problem that is an emergency. Do not wait to see if the symptoms will go away. Get medical help right away. Call your local emergency services (911 in the [place] Do not drive yourself to the hospital. Summary  Thrombophlebitis is a condition in which a blood clot forms in a vein. This can happen in a vein close to the surface of the body or a vein deep inside the body.  This condition can cause serious problems when it happens in a vein deep inside the body (deep vein thrombosis, DVT).  The main symptom of this condition is swelling and pain around the affected vein.  Treatment may include warm compresses, anti-inflammatory medicines, or blood thinners. This information is not intended to replace advice given to you by your health care provider. Make sure you discuss any questions you have with your health care provider. Document Revised: 01/27/2020 Document Reviewed: 01/27/2020 Elsevier Patient Education  [place].

## 2021-01-26 NOTE — Progress Notes (Signed)
Subjective:    I'm seeing this patient as a consultation for:  Dr. Yong Channel. Note will be routed back to referring provider/PCP.  CC: Left calf nodule  I, Molly Weber, LAT, ATC, am serving as scribe for Dr. Lynne Leader.  HPI: Pt is a 55 y/o male presenting w/ a L calf nodule that began on 12/25/20 w/ no known MOI.  He locates the nodule to the posterior aspect of L lower leg that has grown is size and now wraps around towards the anterior aspect.  He reports having done a lot of sitting prior to noticing the nodule/mass in his calf both in the car and while visiting his mother in the hospital.  Pt notes area is TTP and feels like a "burn" when touched.  L calf swelling: no Aggravating factors: pressure to the area Treatments tried: Motrin; IBU 800mg   Past medical history, Surgical history, Family history, Social history, Allergies, and medications have been entered into the medical record, reviewed.   Review of Systems: No new headache, visual changes, nausea, vomiting, diarrhea, constipation, dizziness, abdominal pain, skin rash, fevers, chills, night sweats, weight loss, swollen lymph nodes, body aches, joint swelling, muscle aches, chest pain, shortness of breath, mood changes, visual or auditory hallucinations.   Objective:    Vitals:   01/26/21 1448  BP: (!) 142/88  Pulse: 99  SpO2: 98%   General: Well Developed, well nourished, and in no acute distress.  Neuro/Psych: Alert and oriented x3, extra-ocular muscles intact, able to move all 4 extremities, sensation grossly intact. Skin: Warm and dry, no rashes noted.  Respiratory: Not using accessory muscles, speaking in full sentences, trachea midline.  Cardiovascular: Pulses palpable, no extremity edema. Abdomen: Does not appear distended. MSK: Left calf normal. Firm area of possible induration medial calf near gastrocnemius muscle tendinous junction.  Minimally tender to palpation. No erythema visible.  Lab and Radiology  Results  X-ray left tib-fib obtained today will be done in the near future outside location.   Diagnostic Limited MSK Ultrasound of: Left calf mass Area of palpable mass visualized.  Mass is consistent in appearance with subcutaneous fat tissue.  However that the area of palpable mass there is always a dilated blood vessel that is at least compressible. Impression: Concern for superficial venous thrombophlebitis   Impression and Recommendations:    Assessment and Plan: 55 y.o. male with left calf superficial mass.  Unclear etiology however the most likely explanation at this time is superficial venous thrombophlebitis.  Limited ultrasound examination today reveals that along the course where he has the palpable mass there is a dilated blood vessel.  This to me is most concerning for superficial thrombophlebitis.  We will proceed with a vascular ultrasound for more accurate diagnosis.  If this should be nondiagnostic next step would be MRI of the left calf with and without contrast to fully evaluate and rule out malignancy.. For now recommend compression sleeve and Voltaren gel.  PDMP not reviewed this encounter. Orders Placed This Encounter  Procedures  . [place] LIMITED JOINT SPACE STRUCTURES LOW LEFT(NO LINKED CHARGES)    Standing Status:   Future    Number of Occurrences:   1    Standing Expiration Date:   07/29/2021    Order Specific Question:   Reason for Exam (SYMPTOM  OR DIAGNOSIS REQUIRED)    Answer:   left calf pain    Order Specific Question:   Preferred imaging location?    Answer:   [facility]  .  DG Tibia/Fibula Left    Standing Status:   Future    Standing Expiration Date:   01/26/2022    Order Specific Question:   Reason for Exam (SYMPTOM  OR DIAGNOSIS REQUIRED)    Answer:   eval left leg mass    Order Specific Question:   Preferred imaging location?    Answer:   [facility]   No orders of the defined types were placed in this  encounter.   Discussed warning signs or symptoms. Please see discharge instructions. Patient expresses understanding.   The above documentation has been reviewed and is accurate and complete Lynne Leader, M.D.

## 2021-01-27 ENCOUNTER — Ambulatory Visit (INDEPENDENT_AMBULATORY_CARE_PROVIDER_SITE_OTHER): Payer: BC Managed Care – PPO

## 2021-01-27 ENCOUNTER — Ambulatory Visit (HOSPITAL_COMMUNITY)
Admission: RE | Admit: 2021-01-27 | Discharge: 2021-01-27 | Disposition: A | Discharge status: Home / Self Care | Payer: BC Managed Care – PPO | Source: Ambulatory Visit | Attending: Cardiovascular Disease | Admitting: Cardiovascular Disease

## 2021-01-27 ENCOUNTER — Other Ambulatory Visit: Payer: Self-pay

## 2021-01-27 DIAGNOSIS — M79662 Pain in left lower leg: Secondary | ICD-10-CM | POA: Diagnosis not present

## 2021-01-29 ENCOUNTER — Telehealth: Payer: Self-pay | Admitting: Family Medicine

## 2021-01-29 DIAGNOSIS — M79662 Pain in left lower leg: Secondary | ICD-10-CM

## 2021-01-29 DIAGNOSIS — R2242 Localized swelling, mass and lump, left lower limb: Secondary | ICD-10-CM

## 2021-01-29 NOTE — Progress Notes (Signed)
Left leg x-ray is normal

## 2021-01-29 NOTE — Progress Notes (Signed)
Vascular ultrasound did not show any abnormalities.  Therefore neck step is to proceed with the MRI with and without contrast like we discussed.  MRI ordered.  You should hear soon about scheduling.

## 2021-01-29 NOTE — Telephone Encounter (Signed)
MRI ordered

## 2021-02-16 ENCOUNTER — Other Ambulatory Visit: Payer: Self-pay

## 2021-02-16 ENCOUNTER — Ambulatory Visit
Admission: RE | Admit: 2021-02-16 | Discharge: 2021-02-16 | Disposition: A | Discharge status: Home / Self Care | Payer: BC Managed Care – PPO | Source: Ambulatory Visit | Attending: Family Medicine | Admitting: Family Medicine

## 2021-02-16 DIAGNOSIS — M79662 Pain in left lower leg: Secondary | ICD-10-CM

## 2021-02-16 DIAGNOSIS — R2242 Localized swelling, mass and lump, left lower limb: Secondary | ICD-10-CM

## 2021-02-16 MED ORDER — GADOBENATE DIMEGLUMINE 529 MG/ML IV SOLN
20.0000 mL | Freq: Once | INTRAVENOUS | Status: AC | PRN
  Administered 2021-02-16: 20 mL via INTRAVENOUS

## 2021-02-17 NOTE — Progress Notes (Signed)
MRI leg shows edema bilaterally.  Looks a little worse on the left leg.  It possibly is due to cellulitis which is a bacterial skin infection.  I think this is less likely because its not red and painful.  However the most important thing is that there is no discrete mass or fluid collection therefore cancer or other dangerous cause is very unlikely.  Recommend schedule follow-up appointment with me to go over the results in full detail and discuss treatment plan and options

## 2021-02-23 NOTE — Progress Notes (Signed)
I, Wendy Poet, LAT, ATC, am serving as scribe for Dr. Lynne Leader.  John Costa is a 55 y.o. male who presents to [place] at [facility] today for f/u of L calf nodule.  He was last seen by Dr. Georgina Snell on 01/26/21 and was referred for a L LE venous doppler US that was negative for DVT.  He was then referred for an MRI of his L lower leg and is here today to review those results. Today, pt reports he still isn't having any pain, but the nodule remains. Pt notes the nodule hasn't been growing in size since his last visit.   Diagnostic imaging: L tib/fib MRI- 02/16/21; L LE venous doppler US- 01/27/21; L tib/fib XR- 01/27/21   Pertinent review of systems: No fevers or chills  Relevant historical information: Hypertension managed with amlodipine 5 mg   Exam:  Pulse 93   Ht 5\' 10"  (1.778 m)   Wt 241 lb (109.3 kg)   SpO2 98%   BMI 34.58 kg/m  General: Well Developed, well nourished, and in no acute distress.   MSK: Left calf small induration located distal medial gastrocnemius area.  No erythema. Trace edema bilateral lower extremities.    Lab and Radiology Results  DG Tibia/Fibula Left  Result Date: 01/28/2021 CLINICAL DATA:  Left posterior deep calf mass. No associated pain or known injury. EXAM: LEFT TIBIA AND FIBULA - 2 VIEW COMPARISON:  None. FINDINGS: There is no evidence of fracture or other focal bone lesions. Soft tissues are unremarkable. IMPRESSION: Normal examination.  No radiographically visible mass. Electronically Signed   By: Claudie Revering M.D.   On: 01/28/2021 22:25   MR TIBIA FIBULA LEFT W WO CONTRAST  Result Date: 02/16/2021 CLINICAL DATA:  Enlarging nodule in the posterior aspect of the left calf since 12/25/2020. EXAM: MRI OF LOWER LEFT EXTREMITY WITHOUT AND WITH CONTRAST TECHNIQUE: Multiplanar, multisequence MR imaging of the left lower leg was performed both before and after administration of intravenous contrast. CONTRAST:  20 mL  MULTIHANCE GADOBENATE DIMEGLUMINE 529 MG/ML IV SOLN COMPARISON:  Plain films 01/27/2021. FINDINGS: Bones/Joint/Cartilage Marrow signal is normal throughout without fracture, stress change or focal lesion. Ligaments Negative. Muscles and Tendons Intact.  No intramuscular fluid collection or mass. Soft tissues There is subcutaneous edema in both lower legs. An ill-defined area of enhancement in subcutaneous fatty tissues in the posterior aspect of the left lower leg measures approximately 7.5 cm transverse by 0.9 cm AP by up to 3 cm craniocaudal. This area is centered approximately 18 cm below the knee joint. No discrete mass or fluid collection is identified. IMPRESSION: Diffuse subcutaneous edema about the lower legs bilaterally is presumably due to dependent change. An ill-defined area of enhancement of the left lower leg is nonspecific but has an appearance most suggestive of an infiltrative process such as cellulitis. Negative for focal fluid collection or discrete mass. Electronically Signed   By: Inge Rise M.D.   On: 02/16/2021 20:27   [place] LIMITED JOINT SPACE STRUCTURES LOW LEFT(NO LINKED CHARGES)  Result Date: 02/02/2021 Diagnostic Limited MSK Ultrasound of: Left calf mass Area of palpable mass visualized.  Mass is consistent in appearance with subcutaneous fat tissue.  However that the area of palpable mass there is always a dilated blood vessel that is at least compressible. Impression: Concern for superficial venous thrombophlebitis   VAS [place] LOWER EXTREMITY VENOUS (DVT)  Result Date: 01/27/2021  Lower Venous DVT Study Patient Name:  John Costa  John Costa  Date of Exam:   01/27/2021 Medical Rec #: [id number]            Accession #:    [id number] Date of Birth: December 23, 1965            Patient Gender: M Patient Age:   8Y Exam Location:  [facility] Procedure:      VAS [place] LOWER EXTREMITY VENOUS (DVT) Referring Phys: [id number] Fiorela Pelzer S Jaggar Benko  --------------------------------------------------------------------------------  Other Indications: Patient presents with a left calf nodule that began on                    12/25/20. It is located to the posterior aspect of the left                    lower leg; he believes it has grown in size from a small bump                    to now a horizontal band/subcutaneous lesion/mass that wraps                    around the mid calf, at least 2 cm x 8 cm. No associated                    chest pain, SOB, redness or swelling. Risk Factors: None identified. Performing Technologist: Mariane Masters RVT  Examination Guidelines: A complete evaluation includes B-mode imaging, spectral Doppler, color Doppler, and power Doppler as needed of all accessible portions of each vessel. Bilateral testing is considered an integral part of a complete examination. Limited examinations for reoccurring indications may be performed as noted. The reflux portion of the exam is performed with the patient in reverse Trendelenburg.  +-----+---------------+---------+-----------+----------+--------------+ RIGHTCompressibilityPhasicitySpontaneityPropertiesThrombus Aging +-----+---------------+---------+-----------+----------+--------------+ CFV  Full           Yes      Yes                                 +-----+---------------+---------+-----------+----------+--------------+   +---------+---------------+---------+-----------+----------+--------------+ LEFT     CompressibilityPhasicitySpontaneityPropertiesThrombus Aging +---------+---------------+---------+-----------+----------+--------------+ CFV      Full           Yes      Yes                                 +---------+---------------+---------+-----------+----------+--------------+ SFJ      Full           Yes      Yes                                 +---------+---------------+---------+-----------+----------+--------------+ FV Prox  Full           Yes       Yes                                 +---------+---------------+---------+-----------+----------+--------------+ FV Mid   Full           Yes      Yes                                 +---------+---------------+---------+-----------+----------+--------------+ FV DistalFull  Yes      Yes                                 +---------+---------------+---------+-----------+----------+--------------+ PFV      Full                                                        +---------+---------------+---------+-----------+----------+--------------+ POP      Full           Yes      Yes                                 +---------+---------------+---------+-----------+----------+--------------+ PTV      Full           Yes      Yes                                 +---------+---------------+---------+-----------+----------+--------------+ PERO     Full           Yes      Yes                                 +---------+---------------+---------+-----------+----------+--------------+ Gastroc  Full                                                        +---------+---------------+---------+-----------+----------+--------------+ GSV      Full           Yes      Yes                                 +---------+---------------+---------+-----------+----------+--------------+   Left Technical Findings: Area of patient concern was scanned, no evidence of superficial thrombophlebitis or significant vascular abnormality in this area.   Summary: RIGHT: - No evidence of common femoral vein obstruction.  LEFT: - No evidence of deep vein thrombosis in the lower extremity. No indirect evidence of obstruction proximal to the inguinal ligament. - There is no evidence of superficial venous thrombosis.  - No cystic structure found in the popliteal fossa.  *See table(s) above for measurements and observations. Electronically signed by Jenkins Rouge MD on 01/27/2021 at 11:08:14 PM.    Final     I, Lynne Leader, personally (independently) visualized and performed the interpretation of the MRI images attached in this note.      Assessment and Plan: 55 y.o. male with left leg swelling.  Patient has a focal area of induration medial posterior calf.  Based on his thorough evaluation it appears that the main issue is edema probably secondary to his amlodipine.  This edema is a bit unusual because it somewhat focal probably due to a structural issue that he has in his calf.  Recommend compression and consider adjusting blood pressure medication with PCP.  However if this issue does not resolve in the  next several months he may benefit from biopsy.  Rare issues such as cutaneous T-cell lymphoma is not impossible but extremely unlikely.  Cellulitis is extremely unlikely as he does not have any erythema and is nontender.    Discussed warning signs or symptoms. Please see discharge instructions. Patient expresses understanding.   The above documentation has been reviewed and is accurate and complete Lynne Leader, M.D.  Total encounter time 20 minutes including face-to-face time with the patient and, reviewing past medical record, and charting on the date of service.   Treatment plan options and MRI review.

## 2021-02-24 ENCOUNTER — Other Ambulatory Visit: Payer: Self-pay

## 2021-02-24 ENCOUNTER — Encounter: Payer: Self-pay | Admitting: Family Medicine

## 2021-02-24 ENCOUNTER — Ambulatory Visit (INDEPENDENT_AMBULATORY_CARE_PROVIDER_SITE_OTHER): Payer: BC Managed Care – PPO | Admitting: Family Medicine

## 2021-02-24 VITALS — HR 93 | Ht 70.0 in | Wt 241.0 lb

## 2021-02-24 DIAGNOSIS — R2242 Localized swelling, mass and lump, left lower limb: Secondary | ICD-10-CM | POA: Diagnosis not present

## 2021-02-24 DIAGNOSIS — M79662 Pain in left lower leg: Secondary | ICD-10-CM | POA: Diagnosis not present

## 2021-02-24 NOTE — Patient Instructions (Addendum)
Thank you for coming in today.  I think the main issue is leg edema.   Try compression.   If not better a biopsy would be the next step.

## 2021-04-23 ENCOUNTER — Encounter: Payer: Self-pay | Admitting: Internal Medicine

## 2021-06-14 ENCOUNTER — Other Ambulatory Visit: Payer: Self-pay | Admitting: Urology

## 2021-06-14 DIAGNOSIS — N200 Calculus of kidney: Secondary | ICD-10-CM

## 2021-06-21 NOTE — Progress Notes (Signed)
Left voicemail for pt to call back.

## 2021-06-21 NOTE — Progress Notes (Signed)
Pt returned call. Instructions given. Hx and meds reviewed. Wife is the driver.arrival time 1315. Solids until 0915 and clears until 1115

## 2021-06-23 NOTE — H&P (Signed)
Office Visit Report     06/07/2021   --------------------------------------------------------------------------------   John Costa  MRN: [id number]  DOB: Jun 21, 1966, 55 year old Male  SSN: [id number]   PRIMARY CARE:  Brayton Mars. Melanee Spry, MD  REFERRING:    PROVIDER:  Raynelle Bring, M.D.  TREATING:  Daine Gravel, NP  LOCATION:  [facility] 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 There is an additional nonobstructing left renal calculus as well. Patient's pain is well controlled with 2 mg of Dilaudid. He denies fever, chills, nausea or vomiting. Patient initially presented to the ER with abnormal heart rate and thought he was having a heart attack. That has since subsided and he is doing much better.   12/04/20: 55 year old male who underwent left sided ureteroscopy and left ureteral stent placement for a 38mm obstructing calculi on 11/05/20.   Intraoperative findings per Dr. Lynne Logan operative note are as follows: pelvic kidney, dilated lower pole calyces, normal caliber ureter without filling defects. He was noted to have a an abnormal renal collecting system with a bifid system, with stone noted within the upper pole system. There were no calculi noted in the lower pole system.   He tolerated the procedure very well. A tethered stent was left in place, which he was able to successfully remove. Over the past month he reports that he has felt back to normal. He denies interval fevers, back/flank pain, gross hematuria or racing heart. He is no longer using any pain medications. He denies passage of stone fragments.   06/07/2021: 55 year old male who presents today for a  six-month follow-up after he underwent left-sided ureteroscopy for 7 mm obstructing calculus and has a pelvic kidney. He presents today stating he has been feeling very well since his surgery and had no interval complaints of pain, hematuria, nausea. He has increased his water intake. He does do intermittent fasting as well. His son is attending out state and plays in the band. He did complete his 24 hour urine however, we do not have a in file or in our records.     ALLERGIES: No Allergies    MEDICATIONS: Allopurinol  Sildenafil Citrate 20 mg tablet 1-5 tablet PO PRN as directed  ALPRAZolam 0.25 MG Oral Tablet 0 Oral  Amlodipine Besilate  Bupropion Hcl Sr 150 mg tablet,sustained-release 12 hr Oral  Melatonin TABS Oral  TraMADol HCl - 50 MG Oral Tablet Oral     GU PSH: Cysto Uretero Lithotripsy - 2012 Cystoscopy Insert Stent - 2012, 2012 ESWL - 2012 Ureteroscopic laser litho, Left - 11/05/2020, 2016       PSH Notes: Cystoscopy Ureteroscopy Lithotripsy Incl Insert Indwelling Ureter Stent, Lithotripsy, Knee Surgery, Anterior Segment Photography With Fluorescein Angiography, Cystoscopy With Insertion Of Ureteral Stent Left, Cystoscopy With Pyeloscopy With Lithotripsy, Cystoscopy With Insertion Of Ureteral Stent Left   NON-GU PSH: No Non-GU PSH    GU PMH: Ureteral calculus - 12/04/2020, (Stable), Discussed with patient CT findings which show 7 mm left UPJ calculus as well as nonobstructing 7 mm stone present in the left kidney. Patient states that he did not respond well to ESWL in the past. Per chart review he has had ureteroscopy in 2016 with Dr. Alinda Money. Patient  without infectious parameters and pain well controlled. Will discuss management of stones with Dr. Alinda Money and set patient up for surgery., - 11/02/2020, Calculus of ureter, - 2014 Renal calculus (Stable) - 11/02/2020, Nephrolithiasis, - 2016 BPH w/LUTS - 2018 ED due to arterial insufficiency - 2018 Weak Urinary Stream - 2018 ED  (drug-induced) - 2017 Scrotal edema, Sperm granuloma - 2015      PMH Notes:   1) Urolithiasis: He has a left pelvic kidney who I initially evaluated in March 2012 for an obstructing left UPJ calculus. He has a history of calcium oxalate urolithiasis and hyperoxaluria.   Current treatment: Calcium citrate   Prior treatment:  Apr 2012: Left ureteroscopic laser lithotripsy (calcium oxalate monohydrate)  Nov 2012: L ESWL of proximal ureteral stone  Dec 2016: L ureteroscopic laser lithotripsy (calcium oxalate monohydrate)   2) Left pelvic kidney   NON-GU PMH: Gout, Gout - 2014 Depression GERD Rosanna Randy syndrome    FAMILY HISTORY: Father Deceased At Age29 ___ - Runs In Family nephrolithiasis - Sister, Brother Prostate Cancer - Father   SOCIAL HISTORY: Marital Status: Married Preferred Language: English; Ethnicity: Not Hispanic Or Latino; Race: White Current Smoking Status: Patient has never smoked.  Social Drinker.      Notes: Never A Smoker, Occupation:, Marital History - Currently Married, Alcohol Use   REVIEW OF SYSTEMS:    GU Review Male:   Patient denies frequent urination, hard to postpone urination, burning/ pain with urination, get up at night to urinate, leakage of urine, stream starts and stops, trouble starting your stream, have to strain to urinate , erection problems, and penile pain.  Gastrointestinal (Upper):   Patient denies nausea, vomiting, and indigestion/ heartburn.  Gastrointestinal (Lower):   Patient denies diarrhea and constipation.  Constitutional:   Patient denies fever, night sweats, weight loss, and fatigue.  Musculoskeletal:   Patient denies back pain and joint pain.  Neurological:   Patient denies headaches and dizziness.  Psychologic:   Patient denies depression and anxiety.   VITAL SIGNS:      06/07/2021 12:52 PM  Weight 227 lb / 102.97 kg  Height 70 in / 177.8 cm  BP 128/88 mmHg  Pulse 84 /min  Temperature 97.7 F / 36.5 C  BMI 32.6 kg/m    GU PHYSICAL EXAMINATION:      Notes: NO CVAT   MULTI-SYSTEM PHYSICAL EXAMINATION:    Constitutional: Well-nourished. No physical deformities. Normally developed. Good grooming.  Respiratory: No labored breathing, no use of accessory muscles.   Cardiovascular: Normal temperature, normal extremity pulses, no swelling, no varicosities.  Skin: No paleness, no jaundice, no cyanosis. No lesion, no ulcer, no rash.  Neurologic / Psychiatric: Oriented to time, oriented to place, oriented to person. No depression, no anxiety, no agitation.  Gastrointestinal: No mass, no tenderness, no rigidity, non obese abdomen.     Complexity of Data:  Source Of History:  Patient  Records Review:   Previous Doctor Records, Previous Patient Records  Urine Test Review:   Urinalysis  X-Ray Review: KUB: Reviewed Films. Reviewed Report. Discussed With Patient.     06/07/21  Urinalysis  Urine Appearance Clear   Urine Color Yellow   Urine Glucose Neg mg/dL  Urine Bilirubin Neg mg/dL  Urine Ketones Neg mg/dL  Urine Specific Gravity 1.025   Urine Blood Neg ery/uL  Urine pH 5.5   Urine Protein Neg mg/dL  Urine Urobilinogen 0.2 mg/dL  Urine Nitrites Neg   Urine Leukocyte Esterase Neg leu/uL   PROCEDURES:  KUB - [id number]  A single view of the abdomen is obtained. Renal shadow of the right is visualized there are no appreciable opacities noted. Within the left renal shadow, patient does have a pelvic kidney, this outline is seen around the sacrum. At the level of the sacrum there are 2 opacities noted 1 measuring 5 mm the other measuring 4 mm. There is an overlying bowel gas pattern.      Patient confirmed No Neulasta OnPro Device.           Urinalysis Dipstick Dipstick Cont'd  Color: Yellow Bilirubin: Neg mg/dL  Appearance: Clear Ketones: Neg mg/dL  Specific Gravity: 1.025 Blood: Neg ery/uL  pH: 5.5 Protein: Neg mg/dL  Glucose: Neg mg/dL Urobilinogen: 0.2 mg/dL    Nitrites: Neg    Leukocyte  Esterase: Neg leu/uL    ASSESSMENT:      ICD-10 Details  1 GU:   Renal calculus - N20.0 Left, Chronic, Worsening   PLAN:           Document Letter(s):  Created for Patient: Clinical Summary         Notes:   1. LEft renal Calculus: Urinalysis is not concerning today. He completed a 24 hour urine however, I do not see the results of this in the our system today. I will follow up with the lab regarding these results. He was given a litholink order form as well. I will follow up with his urologist regarding ESWL on the new left sided 4-73mm opacity. It may be that we continue to monitor the progression of the stone with every 6 month evaluations. He continues on potassium citrate and increased hydration. I will follow up with him regarding recommendations.     * Signed by Daine Gravel, NP on 06/07/21 at 1:34 PM (EDT)*      The information contained in this medical record document is considered private and confidential patient information. This information can only be used for the medical diagnosis and/or medical services that are being provided by the patient's selected caregivers. This information can only be distributed outside of the patient's care if the patient agrees and signs waivers of authorization for this information to be sent to an outside source or route.

## 2021-06-24 ENCOUNTER — Ambulatory Visit (HOSPITAL_COMMUNITY): Payer: BC Managed Care – PPO

## 2021-06-24 ENCOUNTER — Encounter (HOSPITAL_BASED_OUTPATIENT_CLINIC_OR_DEPARTMENT_OTHER): Payer: Self-pay | Admitting: Urology

## 2021-06-24 ENCOUNTER — Encounter (HOSPITAL_BASED_OUTPATIENT_CLINIC_OR_DEPARTMENT_OTHER)
Admission: RE | Disposition: A | Discharge status: Home / Self Care | Payer: Self-pay | Source: Home / Self Care | Attending: Urology

## 2021-06-24 ENCOUNTER — Ambulatory Visit (HOSPITAL_BASED_OUTPATIENT_CLINIC_OR_DEPARTMENT_OTHER)
Admission: RE | Admit: 2021-06-24 | Discharge: 2021-06-24 | Disposition: A | Discharge status: Home / Self Care | Payer: BC Managed Care – PPO | Attending: Urology | Admitting: Urology

## 2021-06-24 DIAGNOSIS — N200 Calculus of kidney: Secondary | ICD-10-CM

## 2021-06-24 DIAGNOSIS — N202 Calculus of kidney with calculus of ureter: Secondary | ICD-10-CM | POA: Diagnosis not present

## 2021-06-24 DIAGNOSIS — K219 Gastro-esophageal reflux disease without esophagitis: Secondary | ICD-10-CM | POA: Insufficient documentation

## 2021-06-24 HISTORY — PX: EXTRACORPOREAL SHOCK WAVE LITHOTRIPSY: SHX1557

## 2021-06-24 SURGERY — LITHOTRIPSY, ESWL
Anesthesia: LOCAL | Laterality: Left

## 2021-06-24 MED ORDER — DIAZEPAM 5 MG PO TABS
10.0000 mg | ORAL_TABLET | ORAL | Status: AC
  Administered 2021-06-24: 10 mg via ORAL

## 2021-06-24 MED ORDER — TAMSULOSIN HCL 0.4 MG PO CAPS: 0.4000 mg | ORAL_CAPSULE | Freq: Every day | ORAL | 0 refills | Status: DC

## 2021-06-24 MED ORDER — SODIUM CHLORIDE 0.9 % IV SOLN: INTRAVENOUS | Status: DC

## 2021-06-24 MED ORDER — DIAZEPAM 5 MG PO TABS
ORAL_TABLET | ORAL | Status: AC
  Filled 2021-06-24: qty 2

## 2021-06-24 MED ORDER — ONDANSETRON HCL 8 MG PO TABS: 8.0000 mg | ORAL_TABLET | Freq: Three times a day (TID) | ORAL | 0 refills | Status: DC | PRN

## 2021-06-24 MED ORDER — CIPROFLOXACIN HCL 500 MG PO TABS
ORAL_TABLET | ORAL | Status: AC
  Filled 2021-06-24: qty 1

## 2021-06-24 MED ORDER — DIPHENHYDRAMINE HCL 25 MG PO CAPS
ORAL_CAPSULE | ORAL | Status: AC
  Filled 2021-06-24: qty 1

## 2021-06-24 MED ORDER — HYDROCODONE-ACETAMINOPHEN 7.5-325 MG PO TABS: 1.0000 | ORAL_TABLET | Freq: Four times a day (QID) | ORAL | 0 refills | Status: DC | PRN

## 2021-06-24 MED ORDER — CIPROFLOXACIN HCL 500 MG PO TABS
500.0000 mg | ORAL_TABLET | ORAL | Status: AC
  Administered 2021-06-24: 500 mg via ORAL

## 2021-06-24 MED ORDER — DIPHENHYDRAMINE HCL 25 MG PO CAPS
25.0000 mg | ORAL_CAPSULE | ORAL | Status: AC
  Administered 2021-06-24: 25 mg via ORAL

## 2021-06-24 NOTE — Interval H&P Note (Signed)
History and Physical Interval Note:  06/24/2021 3:41 PM  [name]  has presented today for surgery, with the diagnosis of LEFT RENAL CALCULUS.  The various methods of treatment have been discussed with the patient and family. After consideration of risks, benefits and other options for treatment, the patient has consented to  Procedure(s): EXTRACORPOREAL SHOCK WAVE LITHOTRIPSY (ESWL) (Left) as a surgical intervention.  The patient's history has been reviewed, patient examined, no change in status, stable for surgery.  I have reviewed the patient's chart and labs.  Questions were answered to the patient's satisfaction.  Pt noted a "twinge" of Left low back pain. On KUB today the two stones have spread apart and the smaller one looks to be more in the region of the left UPJ. Discussed we may only be able to treat one stone. He has been well. No dysuria or gross hematuria. No fever.    John Costa

## 2021-06-24 NOTE — Progress Notes (Signed)
Slightly red blotches on left flank.

## 2021-06-24 NOTE — Discharge Instructions (Signed)

## 2021-06-24 NOTE — Op Note (Signed)
Left ESWL   Left 5-6 mm UPJ stone  Findings: the larger stone remained lateral in the calyx but the smaller stone has moved medially to the UPJ and was targeted. It faded well. He may need a staged procedure if he fails to pass the stone or fragments. He will need treatment of the larger left stone in the future. He tolerated well. Discussed with his wife Jackelyn Poling.

## 2021-06-25 ENCOUNTER — Encounter (HOSPITAL_BASED_OUTPATIENT_CLINIC_OR_DEPARTMENT_OTHER): Payer: Self-pay | Admitting: Urology

## 2021-08-14 ENCOUNTER — Other Ambulatory Visit: Payer: Self-pay | Admitting: Family Medicine

## 2021-09-10 ENCOUNTER — Other Ambulatory Visit: Payer: Self-pay | Admitting: Family Medicine

## 2021-11-20 ENCOUNTER — Other Ambulatory Visit: Payer: Self-pay | Admitting: Family Medicine

## 2021-11-23 ENCOUNTER — Other Ambulatory Visit: Payer: Self-pay

## 2021-11-23 ENCOUNTER — Ambulatory Visit: Payer: BC Managed Care – PPO | Admitting: Podiatry

## 2021-11-23 DIAGNOSIS — B353 Tinea pedis: Secondary | ICD-10-CM

## 2021-11-23 DIAGNOSIS — B351 Tinea unguium: Secondary | ICD-10-CM | POA: Diagnosis not present

## 2021-11-23 DIAGNOSIS — Z79899 Other long term (current) drug therapy: Secondary | ICD-10-CM | POA: Diagnosis not present

## 2021-11-23 NOTE — Patient Instructions (Signed)
Terbinafine Tablets What is this medication? TERBINAFINE (TER bin a feen) treats fungal infections of the nails. It belongs to a group of medications called antifungals. It will not treat infections caused by bacteria or viruses. This medicine may be used for other purposes; ask your health care provider or pharmacist if you have questions. COMMON BRAND NAME(S): Lamisil, Terbinex What should I tell my care team before I take this medication? They need to know if you have any of these conditions: Liver disease An unusual or allergic reaction to terbinafine, other medications, foods, dyes, or preservatives Pregnant or trying to get pregnant Breast-feeding How should I use this medication? Take this medication by mouth with water. Take it as directed on the prescription label at the same time every day. You can take it with or without food. If it upsets your stomach, take it with food. Keep taking it unless your care team tells you to stop. A special MedGuide will be given to you by the pharmacist with each prescription and refill. Be sure to read this information carefully each time. Talk to your care team regarding the use of this medication in children. Special care may be needed. Overdosage: If you think you have taken too much of this medicine contact a poison control center or emergency room at once. NOTE: This medicine is only for you. Do not share this medicine with others. What if I miss a dose? If you miss a dose, take it as soon as you can unless it is more than 4 hours late. If it is more than 4 hours late, skip the missed dose. Take the next dose at the normal time. What may interact with this medication? Do not take this medication with any of the following: Pimozide Thioridazine This medication may also interact with the following: Beta blockers Caffeine Certain medications for mental health conditions Cimetidine Cyclosporine Medications for fungal infections like fluconazole  and ketoconazole Medications for irregular heartbeat like amiodarone, flecainide and propafenone Rifampin Warfarin This list may not describe all possible interactions. Give your health care provider a list of all the medicines, herbs, non-prescription drugs, or dietary supplements you use. Also tell them if you smoke, drink alcohol, or use illegal drugs. Some items may interact with your medicine. What should I watch for while using this medication? Visit your care team for regular checks on your progress. You may need blood work while you are taking this medication. It may be some time before you see the benefit from this medication. This medication may cause serious skin reactions. They can happen weeks to months after starting the medication. Contact your care team right away if you notice fevers or flu-like symptoms with a rash. The rash may be red or purple and then turn into blisters or peeling of the skin. Or, you might notice a red rash with swelling of the face, lips or lymph nodes in your neck or under your arms. This medication can make you more sensitive to the sun. Keep out of the sun, If you cannot avoid being in the sun, wear protective clothing and sunscreen. Do not use sun lamps or tanning beds/booths. What side effects may I notice from receiving this medication? Side effects that you should report to your care team as soon as possible: Allergic reactions--skin rash, itching, hives, swelling of the face, lips, tongue, or throat Change in sense of smell Change in taste Infection--fever, chills, cough, or sore throat Liver injury--right upper belly pain, loss of appetite, nausea,   light-colored stool, dark yellow or brown urine, yellowing skin or eyes, unusual weakness or fatigue ?Low red blood cell level--unusual weakness or fatigue, dizziness, headache, trouble breathing ?Lupus-like syndrome--joint pain, swelling, or stiffness, butterfly-shaped rash on the face, rashes that get worse  in the sun, fever, unusual weakness or fatigue ?Rash, fever, and swollen lymph nodes ?Redness, blistering, peeling, or loosening of the skin, including inside the mouth ?Unusual bruising or bleeding ?Worsening mood, feelings of depression ?Side effects that usually do not require medical attention (report to your care team if they continue or are bothersome): ?Diarrhea ?Gas ?Headache ?Nausea ?Stomach pain ?Upset stomach ?This list may not describe all possible side effects. Call your doctor for medical advice about side effects. You may report side effects to FDA at 1-800-FDA-1088. ?Where should I keep my medication? ?Keep out of the reach of children and pets. ?Store between 20 and 25 degrees C (68 and 77 degrees F). Protect from light. Get rid of any unused medication after the expiration date. ?To get rid of medications that are no longer needed or have expired: ?Take the medication to a medication take-back program. Check with your pharmacy or law enforcement to find a location. ?If you cannot return the medication, check the label or package insert to see if the medication should be thrown out in the garbage or flushed down the toilet. If you are not sure, ask your care team. If it is safe to put it in the trash, take the medication out of the container. Mix the medication with cat litter, dirt, coffee grounds, or other unwanted substance. Seal the mixture in a bag or container. Put it in the trash. ?NOTE: This sheet is a summary. It may not cover all possible information. If you have questions about this medicine, talk to your doctor, pharmacist, or health care provider. ?? 2022 Elsevier/Gold Standard (2021-04-21 00:00:00) ? ?

## 2021-11-25 LAB — HEPATIC FUNCTION PANEL
AG Ratio: 1.6 (calc) (ref 1.0–2.5)
ALT: 17 U/L (ref 9–46)
AST: 19 U/L (ref 10–35)
Albumin: 4.7 g/dL (ref 3.6–5.1)
Alkaline phosphatase (APISO): 63 U/L (ref 35–144)
Bilirubin, Direct: 0.3 mg/dL — ABNORMAL HIGH (ref 0.0–0.2)
Globulin: 2.9 g/dL (ref 1.9–3.7)
Indirect Bilirubin: 1.6 mg/dL — ABNORMAL HIGH (ref 0.2–1.2)
Total Bilirubin: 1.9 mg/dL — ABNORMAL HIGH (ref 0.2–1.2)
Total Protein: 7.6 g/dL (ref 6.1–8.1)

## 2021-11-25 LAB — CBC WITH DIFFERENTIAL/PLATELET
Absolute Monocytes: 448 {cells}/uL (ref 200–950)
Basophils Absolute: 48 {cells}/uL (ref 0–200)
Basophils Relative: 0.6 %
Eosinophils Absolute: 128 {cells}/uL (ref 15–500)
Eosinophils Relative: 1.6 %
HCT: 44.8 % (ref 38.5–50.0)
Hemoglobin: 15.1 g/dL (ref 13.2–17.1)
Lymphs Abs: 1832 {cells}/uL (ref 850–3900)
MCH: 28.8 pg (ref 27.0–33.0)
MCHC: 33.7 g/dL (ref 32.0–36.0)
MCV: 85.3 fL (ref 80.0–100.0)
MPV: 11.7 fL (ref 7.5–12.5)
Monocytes Relative: 5.6 %
Neutro Abs: 5544 {cells}/uL (ref 1500–7800)
Neutrophils Relative %: 69.3 %
Platelets: 275 10*3/uL (ref 140–400)
RBC: 5.25 Million/uL (ref 4.20–5.80)
RDW: 15.2 % — ABNORMAL HIGH (ref 11.0–15.0)
Total Lymphocyte: 22.9 %
WBC: 8 10*3/uL (ref 3.8–10.8)

## 2021-11-26 ENCOUNTER — Other Ambulatory Visit: Payer: Self-pay | Admitting: Podiatry

## 2021-11-26 DIAGNOSIS — Z79899 Other long term (current) drug therapy: Secondary | ICD-10-CM

## 2021-11-26 MED ORDER — TERBINAFINE HCL 250 MG PO TABS: 250.0000 mg | ORAL_TABLET | Freq: Every day | ORAL | 0 refills | Status: DC

## 2021-11-26 NOTE — Progress Notes (Signed)
Subjective:  ? ?Patient ID: John Costa, male   DOB: 57 y.o.   MRN: [id number]  ? ?HPI ?56 year old male presents the office today for concerns of reoccurring athlete's foot to his left foot.  He states that about 9 years ago.  Since I last saw him he has seen dermatology and he states that a biopsy was performed of the skin which did confirm fungus.  He states that the nails of also started become yellow.  Swelling of his feet does make it worse and this is intermittent as well.  Tried numerous over-the-counter medicines without significant improvement.  No open sores or any drainage.  No other concerns. ? ? ?Review of Systems  ?All other systems reviewed and are negative. ? ?Past Medical History:  ?Diagnosis Date  ? Allergy   ? Anal fissure   ? Anemia   ? History with gallbladder  ? Anxiety   ? Cataract   ? removed bilateral  ? Cerebral aneurysm   ? CHOLELITHIASIS 06/12/2007  ? Qualifier: Diagnosis of  By: Scherrie Gerlach    ? Depression   ? no treatment  ? GERD (gastroesophageal reflux disease)   ? occasional  ? Gilbert's syndrome   ? Gout   ? Headache(784.0)   ? from cerebral aneurysm  2008  ? HTN (hypertension)   ? Migraines   ? rare  ? Nephrolithiasis 09/19/2010  ? Pelvic kidney   ? Pneumonia   ? 2010  ? PONV (postoperative nausea and vomiting)   ? ? ?Past Surgical History:  ?Procedure Laterality Date  ? ARTERIAL ANEURYSM REPAIR  2008  ? CATARACT EXTRACTION    ? cataracts with lens implants  ? CHOLECYSTECTOMY N/A 11/19/2017  ? Procedure: LAPAROSCOPIC CHOLECYSTECTOMY;  Surgeon: Excell Seltzer, MD;  Location: [facility];  Service: General;  Laterality: N/A;  ? CYSTOSCOPY WITH RETROGRADE PYELOGRAM, URETEROSCOPY AND STENT PLACEMENT Left 08/24/2015  ? Procedure: CYSTOSCOPY WITHLEFT RETROGRADE PYELOGRAM, URETEROSCOPY AND STENT PLACEMENT;  Surgeon: Raynelle Bring, MD;  Location: [facility];  Service: Urology;  Laterality: Left;  ? CYSTOSCOPY WITH RETROGRADE PYELOGRAM, URETEROSCOPY AND STENT PLACEMENT Left 11/05/2020  ?  Procedure: CYSTOSCOPY WITH RETROGRADE PYELOGRAM, URETEROSCOPY AND STENT PLACEMENT;  Surgeon: Raynelle Bring, MD;  Location: [facility];  Service: Urology;  Laterality: Left;  75 MINS  ? EXTRACORPOREAL SHOCK WAVE LITHOTRIPSY Left 06/24/2021  ? Procedure: EXTRACORPOREAL SHOCK WAVE LITHOTRIPSY (ESWL);  Surgeon: Festus Aloe, MD;  Location: [facility];  Service: Urology;  Laterality: Left;  ? HOLMIUM LASER APPLICATION Left 29/03/9891  ? Procedure:  WITH HOLMIUM LASER LITHOTRIPSY;  Surgeon: Raynelle Bring, MD;  Location: [facility];  Service: Urology;  Laterality: Left;  ? HOLMIUM LASER APPLICATION Left 10/07/4172  ? Procedure: HOLMIUM LASER APPLICATION;  Surgeon: Raynelle Bring, MD;  Location: [facility];  Service: Urology;  Laterality: Left;  ? knee arthroscopies Bilateral   ? has had this several times  ? STONE EXTRACTION WITH BASKET  12/30/10  ? UPPER GASTROINTESTINAL ENDOSCOPY    ? ? ? ?Current Outpatient Medications:  ?  acetaminophen (TYLENOL) 500 MG tablet, Take 2 tablets (1,000 mg total) by mouth 3 (three) times daily. (Patient taking differently: Take 1,000 mg by mouth every 8 (eight) hours as needed for moderate pain or mild pain.), Disp: 30 tablet, Rfl: 0 ?  allopurinol (ZYLOPRIM) 100 MG tablet, TAKE 1 TABLET BY MOUTH TWICE A DAY, Disp: 180 tablet, Rfl: 1 ?  amLODipine (NORVASC) 5 MG tablet, TAKE 1 TABLET BY MOUTH EVERY DAY, Disp:  90 tablet, Rfl: 0 ?  CALCIUM & MAGNESIUM CARBONATES PO, Take 1 tablet by mouth daily., Disp: , Rfl:  ?  clonazePAM (KLONOPIN) 0.5 MG tablet, Take 1 tablet (0.5 mg total) by mouth 2 (two) times daily as needed for anxiety., Disp: 50 tablet, Rfl: 0 ?  fluticasone (FLONASE) 50 MCG/ACT nasal spray, Place 2 sprays into both nostrils daily., Disp: , Rfl:  ?  HYDROcodone-acetaminophen (NORCO) 7.5-325 MG tablet, Take 1 tablet by mouth every 6 (six) hours as needed for moderate pain., Disp: 12 tablet, Rfl: 0 ?  ibuprofen (ADVIL) 800 MG tablet, Take 1 tablet (800 mg total) by mouth every  8 (eight) hours as needed for moderate pain (for olecranon bursitis)., Disp: 30 tablet, Rfl: 0 ?  L-THEANINE PO, Take 1 tablet by mouth daily., Disp: , Rfl:  ?  Multiple Vitamins-Minerals (ONE-A-DAY 50 PLUS PO), Take 1 tablet by mouth., Disp: , Rfl:  ?  omeprazole (PRILOSEC) 20 MG capsule, Take 20 mg by mouth daily., Disp: , Rfl:  ?  ondansetron (ZOFRAN) 8 MG tablet, Take 1 tablet (8 mg total) by mouth every 8 (eight) hours as needed for nausea or vomiting., Disp: 10 tablet, Rfl: 0 ?  Potassium 99 MG TABS, Take 99 mg by mouth daily., Disp: , Rfl:  ?  tamsulosin (FLOMAX) 0.4 MG CAPS capsule, Take 1 capsule (0.4 mg total) by mouth daily after supper., Disp: 14 capsule, Rfl: 0 ? ?Allergies  ?Allergen Reactions  ? Cheese Anaphylaxis  ? ? ? ? ?   ?Objective:  ?Physical Exam  ?General: AAO x3, NAD ? ?Dermatological: Dry, peeling, erythematous skin present to the foot.  No pustules or open sores or drainage.  Nails are hypertrophic, dystrophic with yellow discoloration.  There is no pain in the nails there is no swelling redness or any signs of infection.  No open lesions. ? ?Vascular: Dorsalis Pedis artery and Posterior Tibial artery pedal pulses are 2/4 bilateral with immedate capillary fill time.  There is no pain with calf compression, swelling, warmth, erythema.  ? ?Neruologic: Grossly intact via light touch bilateral.  ? ?Musculoskeletal: No gross boney pedal deformities bilateral. No pain, crepitus, or limitation noted with foot and ankle range of motion bilateral. Muscular strength 5/5 in all groups tested bilateral. ? ?Gait: Unassisted, Nonantalgic.  ? ? ?   ?Assessment:  ? ?Tinea pedis, onychomycosis ? ?   ?Plan:  ?-Treatment options discussed including all alternatives, risks, and complications ?-Etiology of symptoms were discussed ?-We discussed different medications to help with the fungus.  Now that he has nail fungus as well discussed oral Lamisil versus topical medications.  Discussed side effects and  wishes to proceed with oral Lamisil.  We will check a CBC and LFT prior to starting the medication. ?-Discussed external measures as well to help with the moisture and reoccurrence of fungus. ? ?[name] DPM ? ?   ? ?

## 2021-12-02 ENCOUNTER — Other Ambulatory Visit: Payer: Self-pay | Admitting: Podiatry

## 2021-12-02 NOTE — Progress Notes (Signed)
I have called the patient. He is going to start the Lamisil but will recheck the blood work in 3-4 weeks. Patient verbalized understanding.  ?

## 2022-01-04 ENCOUNTER — Other Ambulatory Visit: Payer: Self-pay | Admitting: Podiatry

## 2022-01-04 ENCOUNTER — Telehealth: Payer: Self-pay | Admitting: *Deleted

## 2022-01-04 DIAGNOSIS — Z79899 Other long term (current) drug therapy: Secondary | ICD-10-CM

## 2022-01-04 NOTE — Telephone Encounter (Signed)
Patient is calling to request a refill of his terbinafine, has an upcoming appointment in June.Should he schedule for a sooner appointment and have labs redrawn? Please advise. ?

## 2022-01-25 ENCOUNTER — Encounter: Payer: Self-pay | Admitting: Podiatry

## 2022-01-26 ENCOUNTER — Other Ambulatory Visit: Payer: Self-pay | Admitting: Podiatry

## 2022-01-26 DIAGNOSIS — Z79899 Other long term (current) drug therapy: Secondary | ICD-10-CM

## 2022-01-26 MED ORDER — FLUCONAZOLE 150 MG PO TABS: 150.0000 mg | ORAL_TABLET | ORAL | 0 refills | Status: DC

## 2022-01-26 NOTE — Telephone Encounter (Signed)
I called patient back. We discussed options for nail fungus. I am going to hold off on oral Lamisil given bilirubin. We are going to do fluconazole weekly but recheck blood work in 4 weeks. It was ordered for [facility] and he is aware of this. We discussed side affects of the medication and if anything were to change, stop taking the medication and let me know.  ?

## 2022-02-17 ENCOUNTER — Other Ambulatory Visit: Payer: Self-pay | Admitting: Podiatry

## 2022-02-18 ENCOUNTER — Other Ambulatory Visit: Payer: Self-pay | Admitting: Podiatry

## 2022-02-18 LAB — HEPATIC FUNCTION PANEL
ALT: 26 IU/L (ref 0–44)
AST: 23 IU/L (ref 0–40)
Albumin: 4.6 g/dL (ref 3.8–4.9)
Alkaline Phosphatase: 75 IU/L (ref 44–121)
Bilirubin Total: 1.4 mg/dL — ABNORMAL HIGH (ref 0.0–1.2)
Bilirubin, Direct: 0.29 mg/dL (ref 0.00–0.40)
Total Protein: 7.3 g/dL (ref 6.0–8.5)

## 2022-02-18 MED ORDER — FLUCONAZOLE 150 MG PO TABS: 150.0000 mg | ORAL_TABLET | ORAL | 0 refills | Status: DC

## 2022-02-21 ENCOUNTER — Other Ambulatory Visit: Payer: Self-pay | Admitting: Family Medicine

## 2022-02-25 ENCOUNTER — Ambulatory Visit: Payer: BC Managed Care – PPO | Admitting: Podiatry

## 2022-02-25 DIAGNOSIS — Z79899 Other long term (current) drug therapy: Secondary | ICD-10-CM

## 2022-02-25 DIAGNOSIS — B351 Tinea unguium: Secondary | ICD-10-CM | POA: Diagnosis not present

## 2022-02-25 NOTE — Patient Instructions (Signed)
Fluconazole Tablets What is this medication? FLUCONAZOLE (floo KON na zole) prevents and treats fungal or yeast infections. It belongs to a group of medications called antifungals. It will not prevent or treat colds, the flu, or infections caused by bacteria or viruses. This medicine may be used for other purposes; ask your health care provider or pharmacist if you have questions. COMMON BRAND NAME(S): Diflucan What should I tell my care team before I take this medication? They need to know if you have any of these conditions: Irregular heartbeat or rhythm Kidney disease Liver disease Low levels of potassium in the blood An unusual or allergic reaction to fluconazole, other azole antifungals, medications, foods, dyes, or preservatives Pregnant or trying to get pregnant Breast-feeding How should I use this medication? Take this medication by mouth. Follow the directions on the prescription label. Do not take your medication more often than directed. Talk to your care team about the use of this medication in children. Special care may be needed. This medication has been used in children as young as 35 months of age. Overdosage: If you think you have taken too much of this medicine contact a poison control center or emergency room at once. NOTE: This medicine is only for you. Do not share this medicine with others. What if I miss a dose? If you miss a dose, take it as soon as you can. If it is almost time for your next dose, take only that dose. Do not take double or extra doses. What may interact with this medication? Do not take this medication with any of the following: Adagrasib Flibanserin Lomitapide Lonafarnib Other medications that cause heart rhythm changes Triazolam This medication may also interact with the following: Abrocitinib Certain antibiotics, such as rifabutin or rifampin Certain antivirals for HIV or hepatitis Certain medications for blood pressure, heart disease,  irregular heartbeat Certain medications for cholesterol, such as atorvastatin, lovastatin, simvastatin Certain medications for depression, such as amitriptyline or nortriptyline Certain medications for diabetes, such as glipizide or glyburide Certain medications for seizures, such as carbamazepine or phenytoin Certain medications that treat or prevent blood clots, such as warfarin Certain opioid medications for pain, such as alfentanil, fentanyl, methadone Cyclophosphamide Cyclosporine Ibrutinib Lemborexant Midazolam NSAIDS, medications for pain and inflammation, such as ibuprofen or naproxen Olaparib Sirolimus Steroid medications, such as prednisone Tacrolimus Theophylline Tofacitinib Tolvaptan Vinblastine Vincristine Vitamin A Voriconazole This list may not describe all possible interactions. Give your health care provider a list of all the medicines, herbs, non-prescription drugs, or dietary supplements you use. Also tell them if you smoke, drink alcohol, or use illegal drugs. Some items may interact with your medicine. What should I watch for while using this medication? Visit your care team for regular checkups. If you are taking this medication for a long time you may need blood work. Tell your care team if your symptoms do not improve. Some fungal infections need many weeks or months of treatment to cure. Alcohol can increase possible damage to your liver. Avoid alcoholic drinks. If you have a vaginal infection, do not have sex until you have finished your treatment. You can wear a sanitary napkin. Do not use tampons. Wear freshly washed cotton, not synthetic, panties. What side effects may I notice from receiving this medication? Side effects that you should report to your care team as soon as possible: Allergic reactions--skin rash, itching, hives, swelling of the face, lips, tongue, or throat Heart rhythm changes--fast or irregular heartbeat, dizziness, feeling faint or  lightheaded,  chest pain, trouble breathing Liver injury--right upper belly pain, loss of appetite, nausea, light-colored stool, dark yellow or brown urine, yellowing skin or eyes, unusual weakness or fatigue Low adrenal gland function--nausea, vomiting, loss of appetite, unusual weakness or fatigue, dizziness Rash, fever, and swollen lymph nodes Redness, blistering, peeling, or loosening of the skin, including inside the mouth Seizures Side effects that usually do not require medical attention (report to your care team if they continue or are bothersome): Change in taste Diarrhea Dizziness Headache Nausea Stomach pain This list may not describe all possible side effects. Call your doctor for medical advice about side effects. You may report side effects to FDA at 1-800-FDA-1088. Where should I keep my medication? Keep out of the reach of children. Store at room temperature below 30 degrees C (86 degrees F). Throw away any medication after the expiration date. NOTE: This sheet is a summary. It may not cover all possible information. If you have questions about this medicine, talk to your doctor, pharmacist, or health care provider.  2023 Elsevier/Gold Standard (2021-10-07 00:00:00)

## 2022-02-27 NOTE — Progress Notes (Signed)
Subjective: 56 year old male presents the office today for follow-up evaluation of nail fungus, athlete's foot.  He has been on fluconazole with any side effects and he states he has seen good improvement so far.  No side effects to medication.   Objective: AAO x3, NAD DP/PT pulses palpable bilaterally, CRT less than 3 seconds Overall skin appears to be much improved to the [place] dry, peeling skin.  The nails continue to be hypertrophic, dystrophic with yellow discoloration and slight discoloration.  No hyperpigmentation.  No edema, erythema.  No pain. No pain with calf compression, swelling, warmth, erythema  Assessment: Onychomycosis, athlete's foot with improvement on fluconazole  Plan: -All treatment options discussed with the patient including all alternatives, risks, complications.  -Already continue fluconazole.  Continue to monitor for any side effects.  We will recheck the blood work in 2 months.  Order given to get the blood work done. -Patient encouraged to call the office with any questions, concerns, change in symptoms.   [name] DPM

## 2022-03-13 ENCOUNTER — Other Ambulatory Visit: Payer: Self-pay | Admitting: Family Medicine

## 2022-04-06 ENCOUNTER — Telehealth: Payer: Self-pay | Admitting: Family Medicine

## 2022-04-06 MED ORDER — ALLOPURINOL 100 MG PO TABS: 100.0000 mg | ORAL_TABLET | Freq: Two times a day (BID) | ORAL | 3 refills | Status: DC

## 2022-04-06 MED ORDER — AMLODIPINE BESYLATE 5 MG PO TABS: 5.0000 mg | ORAL_TABLET | Freq: Every day | ORAL | 3 refills | Status: DC

## 2022-04-06 NOTE — Telephone Encounter (Signed)
Medication refills sent to pharmacy 

## 2022-04-06 NOTE — Telephone Encounter (Signed)
..   Encourage patient to contact the pharmacy for refills or they can request refills through [place]:  01/25/21  NEXT APPOINTMENT DATE: 04/20/22  MEDICATION:allopurinol (ZYLOPRIM) 100 MG tablet amLODipine (NORVASC) 5 MG tablet  Is the patient out of medication? Almost  [id number]  [phone] Fax:  [phone]     Let patient know to contact pharmacy at the end of the day to make sure medication is ready.  Please notify patient to allow 48-72 hours to process

## 2022-04-12 ENCOUNTER — Encounter: Payer: Self-pay | Admitting: Family Medicine

## 2022-04-12 ENCOUNTER — Ambulatory Visit (INDEPENDENT_AMBULATORY_CARE_PROVIDER_SITE_OTHER): Payer: BC Managed Care – PPO | Admitting: Family Medicine

## 2022-04-12 VITALS — BP 130/98 | HR 92 | Temp 97.7°F | Ht 70.0 in | Wt 236.6 lb

## 2022-04-12 DIAGNOSIS — I10 Essential (primary) hypertension: Secondary | ICD-10-CM | POA: Diagnosis not present

## 2022-04-12 DIAGNOSIS — Z23 Encounter for immunization: Secondary | ICD-10-CM | POA: Diagnosis not present

## 2022-04-12 DIAGNOSIS — R0609 Other forms of dyspnea: Secondary | ICD-10-CM

## 2022-04-12 DIAGNOSIS — R6882 Decreased libido: Secondary | ICD-10-CM

## 2022-04-12 DIAGNOSIS — M1009 Idiopathic gout, multiple sites: Secondary | ICD-10-CM

## 2022-04-12 DIAGNOSIS — F325 Major depressive disorder, single episode, in full remission: Secondary | ICD-10-CM | POA: Insufficient documentation

## 2022-04-12 DIAGNOSIS — N529 Male erectile dysfunction, unspecified: Secondary | ICD-10-CM

## 2022-04-12 MED ORDER — TRAMADOL HCL 50 MG PO TABS
50.0000 mg | ORAL_TABLET | Freq: Four times a day (QID) | ORAL | 1 refills | Status: DC | PRN
Start: 2022-04-12 — End: 2022-09-20

## 2022-04-12 NOTE — Progress Notes (Signed)
Phone [phone] In person visit   Subjective:   John Costa is a 56 y.o. year old very pleasant male patient who presents for/with See problem oriented charting Chief Complaint  Patient presents with   Gout   Hypertension    Pt states Amlodipine is becoming less effective average for July 147/98. Pt denies HA, blurred vision and chest pain.   Weight Gain    Pt states he has noticed steady weight gain over the past 5 months, his endurance is down and he is not able to exercise much due to this.    Erectile Dysfunction    Pt is now on Sildenafil '65mg'$  + Tadalafil '22mg'$ +Apomorphine '2mg'$  PRN.   Past Medical History-  Patient Active Problem List   Diagnosis Date Noted   Aneurysm, carotid artery, internal 02/18/2011    Priority: High   Hypertension 05/21/2017    Priority: Medium    History of adenomatous polyp of colon 12/25/2015    Priority: Medium    Panic attacks 11/26/2014    Priority: Medium    Depression 03/14/2011    Priority: Medium    Gout 06/12/2007    Priority: Medium    Gangrenous cholecystitis s/p lap cholecystectomy 11/19/2017 11/18/2017    Priority: Low   Allergic rhinitis 11/26/2014    Priority: Low   Skin lesion 11/26/2014    Priority: Low   Nephrolithiasis 09/19/2010    Priority: Low   Pityriasis rosea 01/21/2008    Priority: Low   Gilbert's disease 06/12/2007    Priority: Low   Major depressive disorder with single episode, in full remission ([place]) 04/12/2022    Medications- reviewed and updated Current Outpatient Medications  Medication Sig Dispense Refill   acetaminophen (TYLENOL) 500 MG tablet Take 2 tablets (1,000 mg total) by mouth 3 (three) times daily. (Patient taking differently: Take 1,000 mg by mouth every 8 (eight) hours as needed for moderate pain or mild pain.) 30 tablet 0   allopurinol (ZYLOPRIM) 100 MG tablet Take 1 tablet (100 mg total) by mouth 2 (two) times daily. 180 tablet 3   amLODipine (NORVASC) 5 MG tablet Take 1 tablet (5  mg total) by mouth daily. 90 tablet 3   CALCIUM & MAGNESIUM CARBONATES PO Take 1 tablet by mouth daily.     clonazePAM (KLONOPIN) 0.5 MG tablet Take 1 tablet (0.5 mg total) by mouth 2 (two) times daily as needed for anxiety. 50 tablet 0   fluconazole (DIFLUCAN) 150 MG tablet Take 1 tablet (150 mg total) by mouth once a week. 8 tablet 0   fluticasone (FLONASE) 50 MCG/ACT nasal spray Place 2 sprays into both nostrils daily.     ibuprofen (ADVIL) 800 MG tablet Take 1 tablet (800 mg total) by mouth every 8 (eight) hours as needed for moderate pain (for olecranon bursitis). 30 tablet 0   L-THEANINE PO Take 1 tablet by mouth daily.     Multiple Vitamins-Minerals (ONE-A-DAY 50 PLUS PO) Take 1 tablet by mouth.     omeprazole (PRILOSEC) 20 MG capsule Take 20 mg by mouth daily.     Potassium 99 MG TABS Take 99 mg by mouth daily.     traMADol (ULTRAM) 50 MG tablet Take 1-2 tablets (50-100 mg total) by mouth every 6 (six) hours as needed for moderate pain or severe pain (from kidney stones or for headaches). 30 tablet 1   No current facility-administered medications for this visit.     Objective:  BP (!) 130/98   Pulse 92  Temp 97.7 F (36.5 C)   Ht '5\' 10"'$  (1.778 m)   Wt 236 lb 9.6 oz (107.3 kg)   SpO2 96%   BMI 33.95 kg/m  Gen: NAD, resting comfortably No obvious thyromegaly CV: RRR no murmurs rubs or gallops Lungs: CTAB no crackles, wheeze, rhonchi Ext: minimal edema Skin: warm, dry    Assessment and Plan    #hypertension S: medication: Amlodipine 5 mg now at night- more swelling in the day Home readings #s: may BP average 135/89, July average 148/105 -did start diflucan back in may -stopped flonase and started claritin-D- advised stop claritin D. Not much ibuptofen lately BP Readings from Last 3 Encounters:  04/12/22 (!) 130/98  06/24/21 (!) 146/94  01/26/21 (!) 142/88  A/P: Blood pressure is poorly controlled both here and at home but has been using decongestant this  month-encouraged him to stop decongestant and update me within 1 to 2 weeks monitor home blood pressure-May need to consider adding losartan (with gout want to avoid hydrochlorothiazide   #Weight gain-weight up 21 pounds from physical January 2022 but he attributes this to decreased activity levels due to shortness of breath.Majority of weight gain within a 3-4 month period. Tried a weight loss program at home that previously had lost close to 60 lbs and in June only lost 3 lbs with same program. -Check TSH and focus on dyspnea evaluation  #Dyspnea on exertion and fatigue along with decreased libido and worsening erectile dysfunction #Tachycardia S:Had been doing 20-25 mins of rowing most days but has been getting really fatigued  and shortness of breath within 5-6 minutes - started in April or may. No chest pain with this- tried EKG on watch which was reported normal heart rates. Watch is saying low cardiovascular health.  Does ok walking but more intense exercise has been very challenging.  - no calf pain or edema- has done better with changing amlodipne to evening -decreased libido over 6 months - has also received alerts on his watch about HR above 120 even at rest but doesn't feel poorly at the time - also has noted numbness in fingers 3-5 on left hand- not related to exercise . Some olecranon bursitis. Started around Christmas. No neck pain or shoudler pain  A/P: 56 year old male with dyspnea on exertion starting in April and May of this year-decreased activity level seems to have led to significant weight gain but also with fatigue/decreased libido/worsening erectile dysfunction (check testosterone levels-we will collect fasting) -Dyspnea on exertion we will check chest x-ray and echocardiogram - Update labs as below -No tachycardia today and no shortness of breath-with chronicity doubt DVT/PE  -cards consult if above work-up unrevealing (likely would not wait on echocardiogram results)-we  discussed if chest pain or worsening symptoms he should seek care immediately -With tachycardia portion trial off of decongestant but seems to predate use -Heart rate in 90s with movement in office today and was read as sinus rhythm-if we have him back prior to cardiology consult likely update EKG as well-could even consider cardiac monitor   #Gout S: No reported recent flares on allopurinol 200 mg Lab Results  Component Value Date   LABURIC 6.0 02/11/2020  A/P: We opted to update uric acid with labs today as has been over 2 years  #Panic attacks S:Medication: Clonazepam- last fill over 18 months ago-doing well with sparing use A/P: Doing well sparing use-likely at 76-monthphysical refill clonazepam and dispose of all the medications  # Depression S: Medication: None - Has  been able to remain off medication    04/12/2022    9:58 AM 01/25/2021    1:20 PM 10/02/2020    2:58 PM  Depression screen PHQ 2/9  Decreased Interest 0 0 0  Down, Depressed, Hopeless 0 0 0  PHQ - 2 Score 0 0 0  Altered sleeping 0 0 0  Tired, decreased energy 0 0 1  Change in appetite 0 0 0  Feeling bad or failure about yourself  0 0 0  Trouble concentrating 0 0 0  Moving slowly or fidgety/restless 0 0 0  Suicidal thoughts 0 0 0  PHQ-9 Score 0 0 1  Difficult doing work/chores Not difficult at all Not difficult at all Not difficult at all  A/P: Thankfully remains in full remission off medication-continue to monitor  #Carotid artery aneurysm-previously treated at [facility] in [facility].  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  Patient with recurrent headaches since that time controlled with tramadol typically 3-4 times a month but less recently-tries to avoid triggers-last refill for tramadol was January 2022-we opted to refill at this time  Recommended follow up: Return in about 6 months (around 10/13/2022) for physical or sooner if needed.Schedule b4 you leave. Future Appointments  Date Time Provider  [place]  06/14/2022  9:00 AM [facility] NURSE [facility]  10/14/2022  8:00 AM Marin Olp, MD [facility]    Lab/Order associations:   ICD-10-CM   1. DOE (dyspnea on exertion)  R06.09 DG Chest 2 View    ECHOCARDIOGRAM COMPLETE    2. Primary hypertension  I10 TSH    CBC with Differential/Platelet    Comprehensive metabolic panel    Lipid panel    3. Low libido  R68.82 Testosterone Total,Free,Bio, Males    4. Erectile dysfunction, unspecified erectile dysfunction type  N52.9 Testosterone Total,Free,Bio, Males    5. Idiopathic gout of multiple sites, unspecified chronicity  M10.09 Uric acid    6. Major depressive disorder with single episode, in full remission ([place])  F32.5     7. Need for shingles vaccine  Z23 Zoster Recombinant (Shingrix )      Meds ordered this encounter  Medications   traMADol (ULTRAM) 50 MG tablet    Sig: Take 1-2 tablets (50-100 mg total) by mouth every 6 (six) hours as needed for moderate pain or severe pain (from kidney stones or for headaches).    Dispense:  30 tablet    Refill:  1    Time Spent: 45 minutes of total time (10:05 AM-10:40 AM, 12:50 PM-1:00 PM ) was spent on the date of the encounter performing the following actions: chart review prior to seeing the patient, obtaining history from patient, performing a medically necessary exam, counseling on the workup plan and next steps, placing orders, and documenting in our EHR including AVS instructions.    Return precautions advised.  Garret Reddish, MD

## 2022-04-12 NOTE — Patient Instructions (Addendum)
Shingrix #1 today. Repeat injection in 2-6 months. Schedule a nurse visit for the 2nd injection (if they cannot get a physical within 6 months) before you leave today (at the check out desk)  Labs need to be between 8-9 am fasting  Please go to [facility] [place] - located [address] across the street from [place] - in the basement - Hours: 8:30-5:00 PM M-F (with lunch from 12:30- 1 PM). You do NOT need an appointment.   Please go to [facility]  ON FRIDAY - located [address] from [place] - in the basement - Hours: 7:30-5:30 PM M-F. You do NOT need an appointment.   We will call you within two weeks about your referral for echocardiogram. If you do not hear within 2 weeks, give [place] a call.    Stop decongestant and update me in 1-2 weeks. I would say if blood pressure not down within 3-4 days we could either double amlodipine or take twice a day OR I could send in valsartan   Recommended follow up: Return in about 6 months (around 10/13/2022) for physical or sooner if needed.Schedule b4 you leave.

## 2022-04-18 ENCOUNTER — Other Ambulatory Visit (INDEPENDENT_AMBULATORY_CARE_PROVIDER_SITE_OTHER): Payer: BC Managed Care – PPO

## 2022-04-18 ENCOUNTER — Ambulatory Visit (INDEPENDENT_AMBULATORY_CARE_PROVIDER_SITE_OTHER)
Admission: RE | Admit: 2022-04-18 | Discharge: 2022-04-18 | Disposition: A | Discharge status: Home / Self Care | Payer: BC Managed Care – PPO | Source: Ambulatory Visit | Attending: Family Medicine | Admitting: Family Medicine

## 2022-04-18 ENCOUNTER — Encounter: Payer: Self-pay | Admitting: Family Medicine

## 2022-04-18 DIAGNOSIS — M1009 Idiopathic gout, multiple sites: Secondary | ICD-10-CM

## 2022-04-18 DIAGNOSIS — I10 Essential (primary) hypertension: Secondary | ICD-10-CM

## 2022-04-18 DIAGNOSIS — R0609 Other forms of dyspnea: Secondary | ICD-10-CM

## 2022-04-18 DIAGNOSIS — N529 Male erectile dysfunction, unspecified: Secondary | ICD-10-CM

## 2022-04-18 DIAGNOSIS — R6882 Decreased libido: Secondary | ICD-10-CM

## 2022-04-18 LAB — COMPREHENSIVE METABOLIC PANEL
ALT: 16 U/L (ref 0–53)
AST: 18 U/L (ref 0–37)
Albumin: 4.3 g/dL (ref 3.5–5.2)
Alkaline Phosphatase: 56 U/L (ref 39–117)
BUN: 12 mg/dL (ref 6–23)
CO2: 27 mEq/L (ref 19–32)
Calcium: 9.6 mg/dL (ref 8.4–10.5)
Chloride: 103 mEq/L (ref 96–112)
Creatinine, Ser: 1.1 mg/dL (ref 0.40–1.50)
GFR: 75.57 mL/min (ref >60.00)
Glucose, Bld: 107 mg/dL — ABNORMAL HIGH (ref 70–99)
Potassium: 4.1 mEq/L (ref 3.5–5.1)
Sodium: 141 mEq/L (ref 135–145)
Total Bilirubin: 1.2 mg/dL (ref 0.2–1.2)
Total Protein: 7.6 g/dL (ref 6.0–8.3)

## 2022-04-18 LAB — CBC WITH DIFFERENTIAL/PLATELET
Basophils Absolute: 0 10*3/uL (ref 0.0–0.1)
Basophils Relative: 0.7 % (ref 0.0–3.0)
Eosinophils Absolute: 0.2 10*3/uL (ref 0.0–0.7)
Eosinophils Relative: 2.5 % (ref 0.0–5.0)
HCT: 41 % (ref 39.0–52.0)
Hemoglobin: 13.9 g/dL (ref 13.0–17.0)
Lymphocytes Relative: 26.2 % (ref 12.0–46.0)
Lymphs Abs: 1.8 10*3/uL (ref 0.7–4.0)
MCHC: 34 g/dL (ref 30.0–36.0)
MCV: 84.3 fl (ref 78.0–100.0)
Monocytes Absolute: 0.4 10*3/uL (ref 0.1–1.0)
Monocytes Relative: 6 % (ref 3.0–12.0)
Neutro Abs: 4.5 10*3/uL (ref 1.4–7.7)
Neutrophils Relative %: 64.6 % (ref 43.0–77.0)
Platelets: 263 10*3/uL (ref 150.0–400.0)
RBC: 4.86 Mil/uL (ref 4.22–5.81)
RDW: 16.9 % — ABNORMAL HIGH (ref 11.5–15.5)
WBC: 7 10*3/uL (ref 4.0–10.5)

## 2022-04-18 LAB — LIPID PANEL
Cholesterol: 171 mg/dL (ref 0–200)
HDL: 46.2 mg/dL (ref >39.00)
LDL Cholesterol: 101 mg/dL — ABNORMAL HIGH (ref 0–99)
NonHDL: 125.29
Total CHOL/HDL Ratio: 4
Triglycerides: 120 mg/dL (ref 0.0–149.0)
VLDL: 24 mg/dL (ref 0.0–40.0)

## 2022-04-18 LAB — URIC ACID: Uric Acid, Serum: 6.6 mg/dL (ref 4.0–7.8)

## 2022-04-18 LAB — TSH: TSH: 1.5 u[IU]/mL (ref 0.35–5.50)

## 2022-04-19 ENCOUNTER — Other Ambulatory Visit: Payer: Self-pay

## 2022-04-19 DIAGNOSIS — R0602 Shortness of breath: Secondary | ICD-10-CM

## 2022-04-19 LAB — TESTOSTERONE TOTAL,FREE,BIO, MALES
Albumin: 4.2 g/dL (ref 3.6–5.1)
Sex Hormone Binding: 49 nmol/L (ref 10–50)
Testosterone, Bioavailable: 122 ng/dL (ref 110.0–575.0)
Testosterone, Free: 63.3 pg/mL (ref 46.0–224.0)
Testosterone: 631 ng/dL (ref 250–827)

## 2022-04-20 ENCOUNTER — Ambulatory Visit: Payer: BC Managed Care – PPO | Admitting: Family Medicine

## 2022-05-06 ENCOUNTER — Ambulatory Visit (HOSPITAL_COMMUNITY): Payer: BC Managed Care – PPO | Attending: Cardiology

## 2022-05-06 DIAGNOSIS — R0609 Other forms of dyspnea: Secondary | ICD-10-CM | POA: Insufficient documentation

## 2022-05-07 LAB — ECHOCARDIOGRAM COMPLETE
Area-P 1/2: 4.96 cm2
S' Lateral: 2.3 cm

## 2022-05-17 ENCOUNTER — Ambulatory Visit: Payer: BC Managed Care – PPO | Admitting: Cardiology

## 2022-06-13 ENCOUNTER — Encounter: Payer: Self-pay | Admitting: *Deleted

## 2022-06-14 ENCOUNTER — Ambulatory Visit (INDEPENDENT_AMBULATORY_CARE_PROVIDER_SITE_OTHER): Payer: BC Managed Care – PPO

## 2022-06-14 DIAGNOSIS — Z23 Encounter for immunization: Secondary | ICD-10-CM

## 2022-06-14 NOTE — Progress Notes (Signed)
John Costa 56 yr old male presents for his second shingles vaccine per Garret Reddish, MD. Administered SHINGRIX 0.5 mL IM left arm. Patient tolerated well. Flu shot given IM right arm.

## 2022-06-17 ENCOUNTER — Ambulatory Visit: Payer: BC Managed Care – PPO | Attending: Cardiology | Admitting: Cardiology

## 2022-06-17 ENCOUNTER — Ambulatory Visit (INDEPENDENT_AMBULATORY_CARE_PROVIDER_SITE_OTHER): Payer: BC Managed Care – PPO

## 2022-06-17 ENCOUNTER — Encounter: Payer: Self-pay | Admitting: Cardiology

## 2022-06-17 VITALS — BP 112/80 | HR 82 | Ht 70.0 in | Wt 239.0 lb

## 2022-06-17 DIAGNOSIS — Z01812 Encounter for preprocedural laboratory examination: Secondary | ICD-10-CM

## 2022-06-17 DIAGNOSIS — I209 Angina pectoris, unspecified: Secondary | ICD-10-CM | POA: Diagnosis not present

## 2022-06-17 DIAGNOSIS — R000 Tachycardia, unspecified: Secondary | ICD-10-CM

## 2022-06-17 DIAGNOSIS — I208 Other forms of angina pectoris: Secondary | ICD-10-CM

## 2022-06-17 DIAGNOSIS — I671 Cerebral aneurysm, nonruptured: Secondary | ICD-10-CM

## 2022-06-17 DIAGNOSIS — I2089 Other forms of angina pectoris: Secondary | ICD-10-CM

## 2022-06-17 LAB — BASIC METABOLIC PANEL
BUN/Creatinine Ratio: 11 (ref 9–20)
BUN: 11 mg/dL (ref 6–24)
CO2: 23 mmol/L (ref 20–29)
Calcium: 9.7 mg/dL (ref 8.7–10.2)
Chloride: 102 mmol/L (ref 96–106)
Creatinine, Ser: 1.01 mg/dL (ref 0.76–1.27)
Glucose: 106 mg/dL — ABNORMAL HIGH (ref 70–99)
Potassium: 4.6 mmol/L (ref 3.5–5.2)
Sodium: 140 mmol/L (ref 134–144)
eGFR: 88 mL/min/{1.73_m2} (ref >59)

## 2022-06-17 MED ORDER — ONDANSETRON HCL 4 MG PO TABS: 4.0000 mg | ORAL_TABLET | Freq: Three times a day (TID) | ORAL | 0 refills | Status: DC | PRN

## 2022-06-17 MED ORDER — METOPROLOL TARTRATE 100 MG PO TABS: 100.0000 mg | ORAL_TABLET | ORAL | 0 refills | Status: DC

## 2022-06-17 NOTE — Addendum Note (Signed)
Addended by: Shellia Cleverly on: 06/17/2022 03:32 PM   Modules accepted: Orders

## 2022-06-17 NOTE — Patient Instructions (Signed)
Medication Instructions:  The current medical regimen is effective;  continue present plan and medications.  *If you need a refill on your cardiac medications before your next appointment, please call your pharmacy*   Lab Work: Please have blood work today (BMP)  If you have labs (blood work) drawn today and your tests are completely normal, you will receive your results only by: MyChart Message (if you have MyChart) OR A paper copy in the mail If you have any lab test that is abnormal or we need to change your treatment, we will call you to review the results.   Testing/Procedures:   Your cardiac CT will be scheduled at:   [facility] [address] [place], [place] [address] [phone]  Please arrive at the [facility] and Children's Entrance (Entrance C2) of [facility] 30 minutes prior to test start time. You can use the FREE valet parking offered at entrance C (encouraged to control the heart rate for the test)  Proceed to the [facility] Radiology Department (first floor) to check-in and test prep.  All radiology patients and guests should use entrance C2 at [facility], accessed from [facility], even though the hospital's physical address listed is [address].     Please follow these instructions carefully (unless otherwise directed):  Hold all erectile dysfunction medications at least 3 days (72 hrs) prior to test. (Ie viagra, cialis, sildenafil, tadalafil, etc) We will administer nitroglycerin during this exam.   On the Night Before the Test: Be sure to Drink plenty of water. Do not consume any caffeinated/decaffeinated beverages or chocolate 12 hours prior to your test. Do not take any antihistamines 12 hours prior to your test.  On the Day of the Test: Drink plenty of water until 1 hour prior to the test. Do not eat any food 1 hour prior to test. You may take your regular medications prior to the test.  Take  metoprolol (Lopressor) two hours prior to test. HOLD Furosemide/Hydrochlorothiazide morning of the test.      After the Test: Drink plenty of water. After receiving IV contrast, you may experience a mild flushed feeling. This is normal. On occasion, you may experience a mild rash up to 24 hours after the test. This is not dangerous. If this occurs, you can take Benadryl 25 mg and increase your fluid intake. If you experience trouble breathing, this can be serious. If it is severe call 911 IMMEDIATELY. If it is mild, please call our office. If you take any of these medications: Glipizide/Metformin, Avandament, Glucavance, please do not take 48 hours after completing test unless otherwise instructed.  We will call to schedule your test 2-4 weeks out understanding that some insurance companies will need an authorization prior to the service being performed.   For non-scheduling related questions, please contact the cardiac imaging nurse navigator should you have any questions/concerns: Marchia Bond, Cardiac Imaging Nurse Navigator Gordy Clement, Cardiac Imaging Nurse Navigator [facility] Direct Office Dial: [phone]   For scheduling needs, including cancellations and rescheduling, please call [place], [phone].   ZIO XT- Long Term Monitor Instructions  Your physician has requested you wear a ZIO patch monitor for 14 days.  This is a single patch monitor. [employer] supplies one patch monitor per enrollment. Additional stickers are not available. Please do not apply patch if you will be having a Nuclear Stress Test,  Echocardiogram, Cardiac CT, MRI, or Chest Xray during the period  you would be wearing the  monitor. The patch cannot be worn during these tests. You cannot remove and re-apply the  ZIO XT patch monitor.  Your ZIO patch monitor will be mailed 3 day USPS to your address on file. It may take 3-5 days  to receive your monitor after you have been  enrolled.  Once you have received your monitor, please review the enclosed instructions. Your monitor  has already been registered assigning a specific monitor serial # to you.  Billing and Patient Assistance Program Information  We have supplied [employer] with any of your insurance information on file for billing purposes. [employer] offers a sliding scale Patient Assistance Program for patients that do not have  insurance, or whose insurance does not completely cover the cost of the ZIO monitor.  You must apply for the Patient Assistance Program to qualify for this discounted rate.  To apply, please call [employer] at [phone], select option 4, select option 2, ask to apply for  Patient Assistance Program. Theodore Demark will ask your household income, and how many people  are in your household. They will quote your out-of-pocket cost based on that information.  [employer] will also be able to set up a 59-month interest-free payment plan if needed.  Applying the monitor   Shave hair from upper left chest.  Hold abrader disc by orange tab. Rub abrader in 40 strokes over the upper left chest as  indicated in your monitor instructions.  Clean area with 4 enclosed alcohol pads. Let dry.  Apply patch as indicated in monitor instructions. Patch will be placed under collarbone on left  side of chest with arrow pointing upward.  Rub patch adhesive wings for 2 minutes. Remove white label marked "1". Remove the white  label marked "2". Rub patch adhesive wings for 2 additional minutes.  While looking in a mirror, press and release button in center of patch. A small green light will  flash 3-4 times. This will be your only indicator that the monitor has been turned on.  Do not shower for the first 24 hours. You may shower after the first 24 hours.  Press the button if you feel a symptom. You will hear a small click. Record Date, Time and  Symptom in the Patient Logbook.  When you are ready to remove the  patch, follow instructions on the last 2 pages of Patient  Logbook. Stick patch monitor onto the last page of Patient Logbook.  Place Patient Logbook in the blue and white box. Use locking tab on box and tape box closed  securely. The blue and white box has prepaid postage on it. Please place it in the mailbox as  soon as possible. Your physician should have your test results approximately 7 days after the  monitor has been [facility] to [facility]  Call [place] [phone] you have questions regarding  your ZIO XT patch monitor. Call them immediately if you see an orange light blinking on your  monitor.  If your monitor falls off in less than 4 days, contact our Monitor department at [phone]  If your monitor becomes loose or falls off after 4 days call [employer] at [phone]  suggestions on securing your monitor  Follow-Up: [facility] you and your health needs are our priority.  As part of our continuing mission to provide you with exceptional heart care, we have created designated Provider Care Teams.  These Care Teams include your primary Cardiologist (  physician) and Advanced Practice Providers (APPs -  Physician Assistants and Nurse Practitioners) who all work together to provide you with the care you need, when you need it.  We recommend signing up for the patient portal called "MyChart".  Sign up information is provided on this After Visit Summary.  MyChart is used to connect with patients for Virtual Visits (Telemedicine).  Patients are able to view lab/test results, encounter notes, upcoming appointments, etc.  Non-urgent messages can be sent to your provider as well.   To learn more about what you can do with MyChart, go to [email or web address].    Your next appointment:   Follow up will be based on the results of the above testing.  Important Information About [place]

## 2022-06-17 NOTE — Progress Notes (Signed)
Cardiology Office Note:    Date:  06/17/2022   ID:  John Costa, DOB 09-08-1966, MRN [id number]  PCP:  Marin Olp, MD   [facility] Providers Cardiologist:  None     Referring MD: Marin Olp, MD   History of Present Illness:    John Costa is a 56 y.o. male here for the evaluation of shortness of breath at the request of Dr. Yong Channel.  He saw Dr. Yong Channel on 04/12/2022 where he reported becoming very fatigued and short of breath within 5-6 minutes of rowing for exercise since April/May (had been completing 20-25 minutes). He did better while walking. No anginal symptoms at that time. He complained of weight gain (up 21 lbs since physical 09/2020) with worsening fatigue, decreased libido, and worsening ED. He attributed weight gain to his decreased activity levels secondary to his shortness of breath. Labs and X-ray were largely reassuring with no obvious cause for his shortness of breath, so he was referred to cardiology for further evaluation and management.  Notes from 04/12/2022 per Dr. 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  Patient with recurrent headaches since that time controlled with tramadol typically 3-4 times a month but less recently-tries to avoid triggers-last refill for tramadol was January 2022-we opted to refill at this time  Today: He confirms struggling with limiting shortness of breath. Prior to onset of his symptoms, he was completing rowing exercises 10-15 minutes a day, 5-6 times a week. Starting last January, he has not been able to do as much rowing due to his new shortness of breath.   He notes that while rowing he is also sitting. He has received many alerts from his Apple Watch detecting high heart rates at rest, specifically only while he is sitting. This weekend he went on a 5 mile hike [place] with no issues or  concerning symptoms. After returning to the cabin and sitting for 10 minutes he again received an alert for a rapid heart rate.  In general, he does not believe that he has heart issues per se, but he does feel like "something is pushing."  He plans to increase his exercise with more walking and work on weight loss.  Of note, for about 6 months following his right carotid endarterectomy, he was showing symptoms concerning for Wernicke's aphasia. Additionally, he suffers from residual "weird headaches" that occur about 3-4 times a year. Tramadol is the only treatment that resolves his headaches.  He denies any palpitations, chest pain, or peripheral edema. No lightheadedness, syncope, orthopnea, or PND.  No family history of cardiovascular disease, heart attack, or sudden cardiac death.  He is a Probation officer, Pharmacologist.  Able to construct concise statements from larger text.  Past Medical History:  Diagnosis Date   Allergy    Anal fissure    Anemia    History with gallbladder   Anxiety    Cataract    removed bilateral   Cerebral aneurysm    CHOLELITHIASIS 06/12/2007   Qualifier: Diagnosis of  By: Sherren Mocha RN, Dorian Pod     Depression    no treatment   GERD (gastroesophageal reflux disease)    occasional   Gilbert's syndrome    Gout    Headache(784.0)    from cerebral aneurysm  2008   HTN (hypertension)    Migraines    rare   Nephrolithiasis 09/19/2010   Pelvic kidney  Pneumonia    2010   PONV (postoperative nausea and vomiting)     Past Surgical History:  Procedure Laterality Date   ARTERIAL ANEURYSM REPAIR  2008   CATARACT EXTRACTION     cataracts with lens implants   CHOLECYSTECTOMY N/A 11/19/2017   Procedure: LAPAROSCOPIC CHOLECYSTECTOMY;  Surgeon: Excell Seltzer, MD;  Location: [facility];  Service: General;  Laterality: N/A;   CYSTOSCOPY WITH RETROGRADE PYELOGRAM, URETEROSCOPY AND STENT PLACEMENT Left 08/24/2015   Procedure: CYSTOSCOPY WITHLEFT RETROGRADE PYELOGRAM,  URETEROSCOPY AND STENT PLACEMENT;  Surgeon: Raynelle Bring, MD;  Location: [facility];  Service: Urology;  Laterality: Left;   CYSTOSCOPY WITH RETROGRADE PYELOGRAM, URETEROSCOPY AND STENT PLACEMENT Left 11/05/2020   Procedure: CYSTOSCOPY WITH RETROGRADE PYELOGRAM, URETEROSCOPY AND STENT PLACEMENT;  Surgeon: Raynelle Bring, MD;  Location: [facility];  Service: Urology;  Laterality: Left;  75 MINS   EXTRACORPOREAL SHOCK WAVE LITHOTRIPSY Left 06/24/2021   Procedure: EXTRACORPOREAL SHOCK WAVE LITHOTRIPSY (ESWL);  Surgeon: Festus Aloe, MD;  Location: [facility];  Service: Urology;  Laterality: Left;   HOLMIUM LASER APPLICATION Left 62/05/5283   Procedure:  WITH HOLMIUM LASER LITHOTRIPSY;  Surgeon: Raynelle Bring, MD;  Location: [facility];  Service: Urology;  Laterality: Left;   HOLMIUM LASER APPLICATION Left 1/32/4401   Procedure: HOLMIUM LASER APPLICATION;  Surgeon: Raynelle Bring, MD;  Location: [facility];  Service: Urology;  Laterality: Left;   knee arthroscopies Bilateral    has had this several times   STONE EXTRACTION WITH BASKET  12/30/10   UPPER GASTROINTESTINAL ENDOSCOPY      Current Medications: Current Meds  Medication Sig   acetaminophen (TYLENOL) 500 MG tablet Take 2 tablets (1,000 mg total) by mouth 3 (three) times daily.   allopurinol (ZYLOPRIM) 100 MG tablet Take 1 tablet (100 mg total) by mouth 2 (two) times daily.   amLODipine (NORVASC) 5 MG tablet Take 1 tablet (5 mg total) by mouth daily.   CALCIUM & MAGNESIUM CARBONATES PO Take 1 tablet by mouth daily.   clonazePAM (KLONOPIN) 0.5 MG tablet Take 1 tablet (0.5 mg total) by mouth 2 (two) times daily as needed for anxiety.   fluconazole (DIFLUCAN) 150 MG tablet Take 1 tablet (150 mg total) by mouth once a week.   fluticasone (FLONASE) 50 MCG/ACT nasal spray Place 2 sprays into both nostrils daily.   L-THEANINE PO Take 1 tablet by mouth daily.   metoprolol tartrate (LOPRESSOR) 100 MG tablet Take 1 tablet (100 mg total) by mouth  as directed. Take 1 tablet 2 hours before your CT scan   Multiple Vitamins-Minerals (ONE-A-DAY 50 PLUS PO) Take 1 tablet by mouth.   omeprazole (PRILOSEC) 20 MG capsule Take 20 mg by mouth daily.   Potassium 99 MG TABS Take 99 mg by mouth daily.   traMADol (ULTRAM) 50 MG tablet Take 1-2 tablets (50-100 mg total) by mouth every 6 (six) hours as needed for moderate pain or severe pain (from kidney stones or for headaches).     Allergies:   Cheese   Social History   Socioeconomic History   Marital status: Married    Spouse name: Not on file   Number of children: 3   Years of education: Not on file   Highest education level: Not on file  Occupational History   Occupation: Probation officer  Tobacco Use   Smoking status: Never   Smokeless tobacco: Never  Vaping Use   Vaping Use: Never used  Substance and Sexual Activity   Alcohol use: Yes  Alcohol/week: 2.0 - 3.0 standard drinks of alcohol    Types: 2 - 3 Standard drinks or equivalent per week    Comment: occasional   Drug use: Not Currently    Types: Marijuana    Comment: 1990s, Has done it in legal states such as [place] since that time   Sexual activity: Not on file  Other Topics Concern   Not on file  Social History Narrative   Married (wife patient elsewhere). 3 children- daughter 41, Arrie [place] 56, Shawn 08 October 2020.       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       Hobbies: play golf, reading, walking, swimming   Social Determinants of Health   Financial Resource Strain: Not on file  Food Insecurity: Not on file  Transportation Needs: Not on file  Physical Activity: Not on file  Stress: Not on file  Social Connections: Not on file     Family History: The patient's family history includes Diabetes in his brother and sister; Hemachromatosis in his sister; Hemochromatosis in his brother; Prostate cancer (age of onset: 11) in his father. There is no history of  Colon cancer or Colon polyps.  ROS:   Please see the history of present illness.    (+) Shortness of breath (+) Headaches All other systems reviewed and are negative.  EKGs/Labs/Other Studies Reviewed:    The following studies were reviewed today:  Echo  05/06/2022:  1. Left ventricular ejection fraction, by estimation, is 60 to 65%. The  left ventricle has normal function. The left ventricle has no regional  wall motion abnormalities. Left ventricular diastolic parameters were  normal.   2. Right ventricular systolic function is normal. The right ventricular  size is normal. There is normal pulmonary artery systolic pressure. The  estimated right ventricular systolic pressure is 76.7 mmHg.   3. The mitral valve is normal in structure. No evidence of mitral valve  regurgitation. No evidence of mitral stenosis.   4. The aortic valve is grossly normal. Aortic valve regurgitation is not  visualized. No aortic stenosis is present.   5. Aortic dilatation noted. There is borderline dilatation of the  ascending aorta, measuring 38 mm.   6. The inferior vena cava is normal in size with greater than 50%  respiratory variability, suggesting right atrial pressure of 3 mmHg.   Comparison(s): No prior Echocardiogram.   Conclusion(s)/Recommendation(s): Otherwise normal echocardiogram, with  minor abnormalities described in the report.   Left LE Venous Doppler  01/27/2021: Summary:  RIGHT:  - No evidence of common femoral vein obstruction.     LEFT:  - No evidence of deep vein thrombosis in the lower extremity. No indirect  evidence of obstruction proximal to the inguinal ligament.  - There is no evidence of superficial venous thrombosis.     - No cystic structure found in the popliteal fossa.    EKG:  EKG is personally reviewed and interpreted. 06/17/2022: Sinus rhythm. Rate 82 bpm.  Recent Labs: 04/18/2022: ALT 16; BUN 12; Creatinine, Ser 1.10; Hemoglobin 13.9; Platelets 263.0;  Potassium 4.1; Sodium 141; TSH 1.50   Recent Lipid Panel    Component Value Date/Time   CHOL 171 04/18/2022 0749   TRIG 120.0 04/18/2022 0749   HDL 46.20 04/18/2022 0749   CHOLHDL 4 04/18/2022 0749   VLDL 24.0 04/18/2022 0749   LDLCALC 101 (H) 04/18/2022 0749   LDLCALC 153 (H) 10/02/2020 1606   LDLDIRECT 144.1  07/22/2013 1031     Risk Assessment/Calculations:          Physical Exam:    VS:  BP 112/80 (BP Location: Left Arm, Patient Position: Sitting, Cuff Size: Normal)   Pulse 82   Ht '5\' 10"'$  (1.778 m)   Wt 239 lb (108.4 kg)   BMI 34.29 kg/m     Wt Readings from Last 3 Encounters:  06/17/22 239 lb (108.4 kg)  04/12/22 236 lb 9.6 oz (107.3 kg)  06/24/21 229 lb 9.6 oz (104.1 kg)     GEN: Well nourished, well developed in no acute distress HEENT: Normal NECK: No JVD; No carotid bruits LYMPHATICS: No lymphadenopathy CARDIAC: RRR, no murmurs, rubs, gallops RESPIRATORY:  Clear to auscultation without rales, wheezing or rhonchi  ABDOMEN: Soft, non-tender, non-distended MUSCULOSKELETAL:  No edema; No deformity  SKIN: Warm and dry NEUROLOGIC:  Alert and oriented x 3 PSYCHIATRIC:  Normal affect   ASSESSMENT:    1. Angina pectoris ([place])   2. Aneurysm, carotid artery, internal   3. Other forms of angina pectoris ([place])   4. Pre-procedure lab exam   5. Racing heart beat    PLAN:    In order of problems listed above:  Shortness of breath/possible anginal equivalent - We will go ahead and check a coronary CT scan with possible FFR analysis to ensure he does not have any significant stenosis that could be leading to his significant shortness of breath/limitation with exercise.  Interestingly, he thinks that this is occurring more when he tries the rowing machine.  Certainly this may be from the increased effort that a rowing machine requires versus walking or potentially hiking, but it could also be because of restrictive lung physiology when his body is in more of a 90  degree angle.  Nonetheless, I think it makes sense for [place] to check his coronary anatomy.  Carotid artery aneurysm bilaterally/fibromuscular dysplasia - Back in 2008 [facility], bilateral.  He states that they did not believe it was something congenital such as collagen vascular defect.  No brothers or sisters with any similar findings.  He occasionally will have a headache, tramadol seems to help.  Believes that this is excitement of the trigeminal nerve.  With diagnosis of fibromuscular dysplasia, CT with contrast of the brain as well as renal arteries are usually part of the work-up.  These have been done in the past.  Palpitations/tachycardia arrhythmia - Apple Watch has been alerting him for increased heart rate especially when sitting.  We will check a Zio patch monitor to ensure that he does not have any dangerous arrhythmias such as atrial fibrillation.    Follow-up:  Will follow up with results of testing.  Medication Adjustments/Labs and Tests Ordered: Current medicines are reviewed at length with the patient today.  Concerns regarding medicines are outlined above.   Orders Placed This Encounter  Procedures   CT CORONARY MORPH W/CTA COR W/SCORE W/CA W/CM &/OR WO/CM   Basic metabolic panel   LONG TERM MONITOR (3-14 DAYS)   EKG 12-Lead   Meds ordered this encounter  Medications   metoprolol tartrate (LOPRESSOR) 100 MG tablet    Sig: Take 1 tablet (100 mg total) by mouth as directed. Take 1 tablet 2 hours before your CT scan    Dispense:  1 tablet    Refill:  0   Patient Instructions  Medication Instructions:  The current medical regimen is effective;  continue present plan and medications.  *If you need a refill  on your cardiac medications before your next appointment, please call your pharmacy*   Lab Work: Please have blood work today (BMP)  If you have labs (blood work) drawn today and your tests are completely normal, you will receive your results only by: [place] (if you have MyChart) OR A paper copy in the mail If you have any lab test that is abnormal or we need to change your treatment, we will call you to review the results.   Testing/Procedures:   Your cardiac CT will be scheduled at:   [facility] [address] [place], [place] [address] [phone]  Please arrive at the [facility] and Children's Entrance (Entrance C2) of [facility] 30 minutes prior to test start time. You can use the FREE valet parking offered at entrance C (encouraged to control the heart rate for the test)  Proceed to the [facility] Radiology Department (first floor) to check-in and test prep.  All radiology patients and guests should use entrance C2 at [facility], accessed from [facility], even though the hospital's physical address listed is [address].     Please follow these instructions carefully (unless otherwise directed):  Hold all erectile dysfunction medications at least 3 days (72 hrs) prior to test. (Ie viagra, cialis, sildenafil, tadalafil, etc) We will administer nitroglycerin during this exam.   On the Night Before the Test: Be sure to Drink plenty of water. Do not consume any caffeinated/decaffeinated beverages or chocolate 12 hours prior to your test. Do not take any antihistamines 12 hours prior to your test.  On the Day of the Test: Drink plenty of water until 1 hour prior to the test. Do not eat any food 1 hour prior to test. You may take your regular medications prior to the test.  Take metoprolol (Lopressor) two hours prior to test. HOLD Furosemide/Hydrochlorothiazide morning of the test.      After the Test: Drink plenty of water. After receiving IV contrast, you may experience a mild flushed feeling. This is normal. On occasion, you may experience a mild rash up to 24 hours after the test. This is not dangerous. If this occurs, you can take Benadryl 25 mg and increase  your fluid intake. If you experience trouble breathing, this can be serious. If it is severe call 911 IMMEDIATELY. If it is mild, please call our office. If you take any of these medications: Glipizide/Metformin, Avandament, Glucavance, please do not take 48 hours after completing test unless otherwise instructed.  We will call to schedule your test 2-4 weeks out understanding that some insurance companies will need an authorization prior to the service being performed.   For non-scheduling related questions, please contact the cardiac imaging nurse navigator should you have any questions/concerns: Marchia Bond, Cardiac Imaging Nurse Navigator Gordy Clement, Cardiac Imaging Nurse Navigator [facility] Direct Office Dial: [phone]   For scheduling needs, including cancellations and rescheduling, please call [place], [phone].   ZIO XT- Long Term Monitor Instructions  Your physician has requested you wear a ZIO patch monitor for 14 days.  This is a single patch monitor. [employer] supplies one patch monitor per enrollment. Additional stickers are not available. Please do not apply patch if you will be having a Nuclear Stress Test,  Echocardiogram, Cardiac CT, MRI, or Chest Xray during the period you would be wearing the  monitor. The patch cannot be worn during these tests. You cannot remove and re-apply  the  ZIO XT patch monitor.  Your ZIO patch monitor will be mailed 3 day USPS to your address on file. It may take 3-5 days  to receive your monitor after you have been enrolled.  Once you have received your monitor, please review the enclosed instructions. Your monitor  has already been registered assigning a specific monitor serial # to you.  Billing and Patient Assistance Program Information  We have supplied [employer] with any of your insurance information on file for billing purposes. [employer] offers a sliding scale Patient Assistance Program for  patients that do not have  insurance, or whose insurance does not completely cover the cost of the ZIO monitor.  You must apply for the Patient Assistance Program to qualify for this discounted rate.  To apply, please call [employer] at [phone], select option 4, select option 2, ask to apply for  Patient Assistance Program. Theodore Demark will ask your household income, and how many people  are in your household. They will quote your out-of-pocket cost based on that information.  [employer] will also be able to set up a 91-month interest-free payment plan if needed.  Applying the monitor   Shave hair from upper left chest.  Hold abrader disc by orange tab. Rub abrader in 40 strokes over the upper left chest as  indicated in your monitor instructions.  Clean area with 4 enclosed alcohol pads. Let dry.  Apply patch as indicated in monitor instructions. Patch will be placed under collarbone on left  side of chest with arrow pointing upward.  Rub patch adhesive wings for 2 minutes. Remove white label marked "1". Remove the white  label marked "2". Rub patch adhesive wings for 2 additional minutes.  While looking in a mirror, press and release button in center of patch. A small green light will  flash 3-4 times. This will be your only indicator that the monitor has been turned on.  Do not shower for the first 24 hours. You may shower after the first 24 hours.  Press the button if you feel a symptom. You will hear a small click. Record Date, Time and  Symptom in the Patient Logbook.  When you are ready to remove the patch, follow instructions on the last 2 pages of Patient  Logbook. Stick patch monitor onto the last page of Patient Logbook.  Place Patient Logbook in the blue and white box. Use locking tab on box and tape box closed  securely. The blue and white box has prepaid postage on it. Please place it in the mailbox as  soon as possible. Your physician should have your test results approximately  7 days after the  monitor has been mailed back to [facility]  Call [place] [phone] you have questions regarding  your ZIO XT patch monitor. Call them immediately if you see an orange light blinking on your  monitor.  If your monitor falls off in less than 4 days, contact our Monitor department at [phone]  If your monitor becomes loose or falls off after 4 days call [employer] at [phone]  suggestions on securing your monitor  Follow-Up: At [facility] you and your health needs are our priority.  As part of our continuing mission to provide you with exceptional heart care, we have created designated Provider Care Teams.  These Care Teams include your primary Cardiologist (physician) and Advanced Practice Providers (APPs -  Physician Assistants and Nurse Practitioners) who all work together to provide you  with the care you need, when you need it.  We recommend signing up for the patient portal called "MyChart".  Sign up information is provided on this After Visit Summary.  MyChart is used to connect with patients for Virtual Visits (Telemedicine).  Patients are able to view lab/test results, encounter notes, upcoming appointments, etc.  Non-urgent messages can be sent to your provider as well.   To learn more about what you can do with MyChart, go to [email or web address].    Your next appointment:   Follow up will be based on the results of the above testing.  Important Information About Sugar         I,Mathew Stumpf,acting as a scribe for Candee Furbish, MD.,have documented all relevant documentation on the behalf of Candee Furbish, MD,as directed by  Candee Furbish, MD while in the presence of Candee Furbish, MD.  I, Candee Furbish, MD, have reviewed all documentation for this visit. The documentation on 06/17/22 for the exam, diagnosis, procedures, and orders are all accurate and complete.   Signed, Candee Furbish, MD  06/17/2022 12:45 PM     [place]

## 2022-06-17 NOTE — Progress Notes (Unsigned)
ZIO XT mailed to pt's home address. 

## 2022-06-23 ENCOUNTER — Telehealth: Payer: BC Managed Care – PPO | Admitting: Family Medicine

## 2022-06-23 DIAGNOSIS — U071 COVID-19: Secondary | ICD-10-CM | POA: Diagnosis not present

## 2022-06-23 MED ORDER — BENZONATATE 100 MG PO CAPS: 100.0000 mg | ORAL_CAPSULE | Freq: Two times a day (BID) | ORAL | 0 refills | Status: DC | PRN

## 2022-06-23 MED ORDER — NIRMATRELVIR/RITONAVIR (PAXLOVID)TABLET: 3.0000 | ORAL_TABLET | Freq: Two times a day (BID) | ORAL | 0 refills | Status: AC

## 2022-06-23 NOTE — Progress Notes (Signed)
Virtual Visit Consent   John Costa, you are scheduled for a virtual visit with a [place] provider today. Just as with appointments in the office, your consent must be obtained to participate. Your consent will be active for this visit and any virtual visit you may have with one of our providers in the next 365 days. If you have a MyChart account, a copy of this consent can be sent to you electronically.  As this is a virtual visit, video technology does not allow for your provider to perform a traditional examination. This may limit your provider's ability to fully assess your condition. If your provider identifies any concerns that need to be evaluated in person or the need to arrange testing (such as labs, EKG, etc.), we will make arrangements to do so. Although advances in technology are sophisticated, we cannot ensure that it will always work on either your end or our end. If the connection with a video visit is poor, the visit may have to be switched to a telephone visit. With either a video or telephone visit, we are not always able to ensure that we have a secure connection.  By engaging in this virtual visit, you consent to the provision of healthcare and authorize for your insurance to be billed (if applicable) for the services provided during this visit. Depending on your insurance coverage, you may receive a charge related to this service.  I need to obtain your verbal consent now. Are you willing to proceed with your visit today? John Costa has provided verbal consent on 06/23/2022 for a virtual visit (video or telephone). John Mayo, NP  Date: 06/23/2022 12:53 PM  Virtual Visit via Video Note   I, John Costa, connected with  John Costa  ([id number], June 23, 1966) on 06/23/22 at  1:15 PM EDT by a video-enabled telemedicine application and verified that I am speaking with the correct person using two identifiers.  Location: Patient: Virtual Visit  Location Patient: Home Provider: Virtual Visit Location Provider: Home Office   I discussed the limitations of evaluation and management by telemedicine and the availability of in person appointments. The patient expressed understanding and agreed to proceed.    History of Present Illness: John Costa is a 55 y.o. who identifies as a male who was assigned male at birth, and is being seen today for COVID + today. Onset of symptoms was this morning- achy, tired. Cough, and some drainage- nasal. Unsure of fever. First time having COVID. Denies sore throat, chest pain, shortness of breath. Known COVID sick contact in last several days.  Problems:  Patient Active Problem List   Diagnosis Date Noted   Major depressive disorder with single episode, in full remission ([place]) 04/12/2022   Gangrenous cholecystitis s/p lap cholecystectomy 11/19/2017 11/18/2017   Hypertension 05/21/2017   History of adenomatous polyp of colon 12/25/2015   Panic attacks 11/26/2014   Allergic rhinitis 11/26/2014   Skin lesion 11/26/2014   Depression 03/14/2011   Aneurysm, carotid artery, internal 02/18/2011   Nephrolithiasis 09/19/2010   Pityriasis rosea 01/21/2008   Gout 06/12/2007   Gilbert's disease 06/12/2007    Allergies:  Allergies  Allergen Reactions   Cheese Anaphylaxis   Medications:  Current Outpatient Medications:    acetaminophen (TYLENOL) 500 MG tablet, Take 2 tablets (1,000 mg total) by mouth 3 (three) times daily., Disp: 30 tablet, Rfl: 0   allopurinol (ZYLOPRIM) 100 MG tablet, Take 1 tablet (100 mg total) by mouth 2 (two)  times daily., Disp: 180 tablet, Rfl: 3   amLODipine (NORVASC) 5 MG tablet, Take 1 tablet (5 mg total) by mouth daily., Disp: 90 tablet, Rfl: 3   CALCIUM & MAGNESIUM CARBONATES PO, Take 1 tablet by mouth daily., Disp: , Rfl:    clonazePAM (KLONOPIN) 0.5 MG tablet, Take 1 tablet (0.5 mg total) by mouth 2 (two) times daily as needed for anxiety., Disp: 50 tablet, Rfl: 0    fluconazole (DIFLUCAN) 150 MG tablet, Take 1 tablet (150 mg total) by mouth once a week., Disp: 8 tablet, Rfl: 0   fluticasone (FLONASE) 50 MCG/ACT nasal spray, Place 2 sprays into both nostrils daily., Disp: , Rfl:    L-THEANINE PO, Take 1 tablet by mouth daily., Disp: , Rfl:    metoprolol tartrate (LOPRESSOR) 100 MG tablet, Take 1 tablet (100 mg total) by mouth as directed. Take 1 tablet 2 hours before your CT scan, Disp: 1 tablet, Rfl: 0   Multiple Vitamins-Minerals (ONE-A-DAY 50 PLUS PO), Take 1 tablet by mouth., Disp: , Rfl:    omeprazole (PRILOSEC) 20 MG capsule, Take 20 mg by mouth daily., Disp: , Rfl:    ondansetron (ZOFRAN) 4 MG tablet, Take 1 tablet (4 mg total) by mouth every 8 (eight) hours as needed for nausea or vomiting. Take one tablet 2 hours before your CT scan, Disp: 4 tablet, Rfl: 0   Potassium 99 MG TABS, Take 99 mg by mouth daily., Disp: , Rfl:    traMADol (ULTRAM) 50 MG tablet, Take 1-2 tablets (50-100 mg total) by mouth every 6 (six) hours as needed for moderate pain or severe pain (from kidney stones or for headaches)., Disp: 30 tablet, Rfl: 1  Observations/Objective: Patient is well-developed, well-nourished in no acute distress.  Resting comfortably  at home.  Head is normocephalic, atraumatic.  No labored breathing.  Speech is clear and coherent with logical content.  Patient is alert and oriented at baseline.    Assessment and Plan:  1. COVID-19  - nirmatrelvir/ritonavir EUA (PAXLOVID) 20 x 150 MG & 10 x '100MG'$  TABS; Take 3 tablets by mouth 2 (two) times daily for 5 days. (Take nirmatrelvir 150 mg two tablets twice daily for 5 days and ritonavir 100 mg one tablet twice daily for 5 days) Patient GFR is 88  Dispense: 30 tablet; Refill: 0 - benzonatate (TESSALON) 100 MG capsule; Take 1 capsule (100 mg total) by mouth 2 (two) times daily as needed for cough.  Dispense: 20 capsule; Refill: 0   -rest -hydrate -OTC reviewed on AVS -desire for antivirals     Reviewed side effects, risks and benefits of medication.    Patient acknowledged agreement and understanding of the plan.   Past Medical, Surgical, Social History, Allergies, and Medications have been Reviewed.   Follow Up Instructions: I discussed the assessment and treatment plan with the patient. The patient was provided an opportunity to ask questions and all were answered. The patient agreed with the plan and demonstrated an understanding of the instructions.  A copy of instructions were sent to the patient via MyChart unless otherwise noted below.    The patient was advised to call back or seek an in-person evaluation if the symptoms worsen or if the condition fails to improve as anticipated.  Time:  I spent 10 minutes with the patient via telehealth technology discussing the above problems/concerns.    John Mayo, NP

## 2022-06-23 NOTE — Patient Instructions (Signed)
John Costa, thank you for joining Perlie Mayo, NP for today's virtual visit.  While this provider is not your primary care provider (PCP), if your PCP is located in our provider database this encounter information will be shared with them immediately following your visit.  Consent: (Patient) John Costa provided verbal consent for this virtual visit at the beginning of the encounter.  Current Medications:  Current Outpatient Medications:    benzonatate (TESSALON) 100 MG capsule, Take 1 capsule (100 mg total) by mouth 2 (two) times daily as needed for cough., Disp: 20 capsule, Rfl: 0   nirmatrelvir/ritonavir EUA (PAXLOVID) 20 x 150 MG & 10 x '100MG'$  TABS, Take 3 tablets by mouth 2 (two) times daily for 5 days. (Take nirmatrelvir 150 mg two tablets twice daily for 5 days and ritonavir 100 mg one tablet twice daily for 5 days) Patient GFR is 88, Disp: 30 tablet, Rfl: 0   acetaminophen (TYLENOL) 500 MG tablet, Take 2 tablets (1,000 mg total) by mouth 3 (three) times daily., Disp: 30 tablet, Rfl: 0   allopurinol (ZYLOPRIM) 100 MG tablet, Take 1 tablet (100 mg total) by mouth 2 (two) times daily., Disp: 180 tablet, Rfl: 3   amLODipine (NORVASC) 5 MG tablet, Take 1 tablet (5 mg total) by mouth daily., Disp: 90 tablet, Rfl: 3   CALCIUM & MAGNESIUM CARBONATES PO, Take 1 tablet by mouth daily., Disp: , Rfl:    clonazePAM (KLONOPIN) 0.5 MG tablet, Take 1 tablet (0.5 mg total) by mouth 2 (two) times daily as needed for anxiety., Disp: 50 tablet, Rfl: 0   fluconazole (DIFLUCAN) 150 MG tablet, Take 1 tablet (150 mg total) by mouth once a week., Disp: 8 tablet, Rfl: 0   fluticasone (FLONASE) 50 MCG/ACT nasal spray, Place 2 sprays into both nostrils daily., Disp: , Rfl:    L-THEANINE PO, Take 1 tablet by mouth daily., Disp: , Rfl:    metoprolol tartrate (LOPRESSOR) 100 MG tablet, Take 1 tablet (100 mg total) by mouth as directed. Take 1 tablet 2 hours before your CT scan, Disp: 1 tablet, Rfl:  0   Multiple Vitamins-Minerals (ONE-A-DAY 50 PLUS PO), Take 1 tablet by mouth., Disp: , Rfl:    omeprazole (PRILOSEC) 20 MG capsule, Take 20 mg by mouth daily., Disp: , Rfl:    ondansetron (ZOFRAN) 4 MG tablet, Take 1 tablet (4 mg total) by mouth every 8 (eight) hours as needed for nausea or vomiting. Take one tablet 2 hours before your CT scan, Disp: 4 tablet, Rfl: 0   Potassium 99 MG TABS, Take 99 mg by mouth daily., Disp: , Rfl:    traMADol (ULTRAM) 50 MG tablet, Take 1-2 tablets (50-100 mg total) by mouth every 6 (six) hours as needed for moderate pain or severe pain (from kidney stones or for headaches)., Disp: 30 tablet, Rfl: 1   Medications ordered in this encounter:  Meds ordered this encounter  Medications   nirmatrelvir/ritonavir EUA (PAXLOVID) 20 x 150 MG & 10 x '100MG'$  TABS    Sig: Take 3 tablets by mouth 2 (two) times daily for 5 days. (Take nirmatrelvir 150 mg two tablets twice daily for 5 days and ritonavir 100 mg one tablet twice daily for 5 days) Patient GFR is 88    Dispense:  30 tablet    Refill:  0    Order Specific Question:   Supervising Provider    Answer:   Chase Picket [[id number]]   benzonatate (TESSALON) 100 MG capsule  Sig: Take 1 capsule (100 mg total) by mouth 2 (two) times daily as needed for cough.    Dispense:  20 capsule    Refill:  0    Order Specific Question:   Supervising Provider    Answer:   Chase Picket [id number]     *If you need refills on other medications prior to your next appointment, please contact your pharmacy*  Follow-Up: Call back or seek an in-person evaluation if the symptoms worsen or if the condition fails to improve as anticipated.  [place] [phone]  Other Instructions Please keep well-hydrated and get plenty of rest. Start a saline nasal rinse to flush out your nasal passages. You can use plain Mucinex to help thin congestion. If you have a humidifier, running in the bedroom at night. I want you  to start OTC vitamin D3 1000 units daily, vitamin C 1000 mg daily, and a zinc supplement. Please take prescribed medications as directed.  You have been enrolled in a MyChart symptom monitoring program. Please answer these questions daily so we can keep track of how you are doing.  You were to quarantine for 5 days from onset of your symptoms.  After day 5, if you have had no fever and you are feeling better, you can end quarantine but need to mask for an additional 5 days. After day 5 if you have a fever or are having significant symptoms, please quarantine for full 10 days.  If you note any worsening of symptoms, any significant shortness of breath or any chest pain, please seek ER evaluation ASAP.  Please do not delay care!  COVID-19: What to Do if You Are Sick If you test positive and are an older adult or someone who is at high risk of getting very sick from COVID-19, treatment may be available. Contact a healthcare provider right away after a positive test to determine if you are eligible, even if your symptoms are mild right now. You can also visit a Test to Treat location and, if eligible, receive a prescription from a provider. Don't delay: Treatment must be started within the first few days to be effective. If you have a fever, cough, or other symptoms, you might have COVID-19. Most people have mild illness and are able to recover at home. If you are sick: Keep track of your symptoms. If you have an emergency warning sign (including trouble breathing), call 911. Steps to help prevent the spread of COVID-19 if you are sick If you are sick with COVID-19 or think you might have COVID-19, follow the steps below to care for yourself and to help protect other people in your home and community. Stay home except to get medical care Stay home. Most people with COVID-19 have mild illness and can recover at home without medical care. Do not leave your home, except to get medical care. Do not visit  public areas and do not go to places where you are unable to wear a mask. Take care of yourself. Get rest and stay hydrated. Take over-the-counter medicines, such as acetaminophen, to help you feel better. Stay in touch with your doctor. Call before you get medical care. Be sure to get care if you have trouble breathing, or have any other emergency warning signs, or if you think it is an emergency. Avoid public transportation, ride-sharing, or taxis if possible. Get tested If you have symptoms of COVID-19, get tested. While waiting for test results, stay away from others,  including staying apart from those living in your household. Get tested as soon as possible after your symptoms start. Treatments may be available for people with COVID-19 who are at risk for becoming very sick. Don't delay: Treatment must be started early to be effective--some treatments must begin within 5 days of your first symptoms. Contact your healthcare provider right away if your test result is positive to determine if you are eligible. Self-tests are one of several options for testing for the virus that causes COVID-19 and may be more convenient than laboratory-based tests and point-of-care tests. Ask your healthcare provider or your local health department if you need help interpreting your test results. You can visit your state, tribal, local, and territorial health department's website to look for the latest local information on testing sites. Separate yourself from other people As much as possible, stay in a specific room and away from other people and pets in your home. If possible, you should use a separate bathroom. If you need to be around other people or animals in or outside of the home, wear a well-fitting mask. Tell your close contacts that they may have been exposed to COVID-19. An infected person can spread COVID-19 starting 48 hours (or 2 days) before the person has any symptoms or tests positive. By letting your  close contacts know they may have been exposed to COVID-19, you are helping to protect everyone. See COVID-19 and Animals if you have questions about pets. If you are diagnosed with COVID-19, someone from the health department may call you. Answer the call to slow the spread. Monitor your symptoms Symptoms of COVID-19 include fever, cough, or other symptoms. Follow care instructions from your healthcare provider and local health department. Your local health authorities may give instructions on checking your symptoms and reporting information. When to seek emergency medical attention Look for emergency warning signs* for COVID-19. If someone is showing any of these signs, seek emergency medical care immediately: Trouble breathing Persistent pain or pressure in the chest New confusion Inability to wake or stay awake Pale, gray, or blue-colored skin, lips, or nail beds, depending on skin tone *This list is not all possible symptoms. Please call your medical provider for any other symptoms that are severe or concerning to you. Call 911 or call ahead to your local emergency facility: Notify the operator that you are seeking care for someone who has or may have COVID-19. Call ahead before visiting your doctor Call ahead. Many medical visits for routine care are being postponed or done by phone or telemedicine. If you have a medical appointment that cannot be postponed, call your doctor's office, and tell them you have or may have COVID-19. This will help the office protect themselves and other patients. If you are sick, wear a well-fitting mask You should wear a mask if you must be around other people or animals, including pets (even at home). Wear a mask with the best fit, protection, and comfort for you. You don't need to wear the mask if you are alone. If you can't put on a mask (because of trouble breathing, for example), cover your coughs and sneezes in some other way. Try to stay at least 6  feet away from other people. This will help protect the people around you. Masks should not be placed on young children under age 48 years, anyone who has trouble breathing, or anyone who is not able to remove the mask without help. Cover your coughs and sneezes Cover your mouth  and nose with a tissue when you cough or sneeze. Throw away used tissues in a lined trash can. Immediately wash your hands with soap and water for at least 20 seconds. If soap and water are not available, clean your hands with an alcohol-based hand sanitizer that contains at least 60% alcohol. Clean your hands often Wash your hands often with soap and water for at least 20 seconds. This is especially important after blowing your nose, coughing, or sneezing; going to the bathroom; and before eating or preparing food. Use hand sanitizer if soap and water are not available. Use an alcohol-based hand sanitizer with at least 60% alcohol, covering all surfaces of your hands and rubbing them together until they feel dry. Soap and water are the best option, especially if hands are visibly dirty. Avoid touching your eyes, nose, and mouth with unwashed hands. Handwashing Tips Avoid sharing personal household items Do not share dishes, drinking glasses, cups, eating utensils, towels, or bedding with other people in your home. Wash these items thoroughly after using them with soap and water or put in the dishwasher. Clean surfaces in your home regularly Clean and disinfect high-touch surfaces (for example, doorknobs, tables, handles, light switches, and countertops) in your "sick room" and bathroom. In shared spaces, you should clean and disinfect surfaces and items after each use by the person who is ill. If you are sick and cannot clean, a caregiver or other person should only clean and disinfect the area around you (such as your bedroom and bathroom) on an as needed basis. Your caregiver/other person should wait as long as possible  (at least several hours) and wear a mask before entering, cleaning, and disinfecting shared spaces that you use. Clean and disinfect areas that may have blood, stool, or body fluids on them. Use household cleaners and disinfectants. Clean visible dirty surfaces with household cleaners containing soap or detergent. Then, use a household disinfectant. Use a product from [employer] List N: Disinfectants for Coronavirus (GHWEX-93). Be sure to follow the instructions on the label to ensure safe and effective use of the product. Many products recommend keeping the surface wet with a disinfectant for a certain period of time (look at "contact time" on the product label). You may also need to wear personal protective equipment, such as gloves, depending on the directions on the product label. Immediately after disinfecting, wash your hands with soap and water for 20 seconds. For completed guidance on cleaning and disinfecting your home, visit Complete Disinfection Guidance. Take steps to improve ventilation at home Improve ventilation (air flow) at home to help prevent from spreading COVID-19 to other people in your household. Clear out COVID-19 virus particles in the air by opening windows, using air filters, and turning on fans in your home. Use this interactive tool to learn how to improve air flow in your home. When you can be around others after being sick with COVID-19 Deciding when you can be around others is different for different situations. Find out when you can safely end home isolation. For any additional questions about your care, contact your healthcare provider or state or local health department. 12/08/2020 Content source: [facility] for Immunization and Respiratory Diseases ([facility]), Division of Viral Diseases This information is not intended to replace advice given to you by your health care provider. Make sure you discuss any questions you have with your health care provider. Document  Revised: 01/21/2021 Document Reviewed: 01/21/2021 Elsevier Patient Education  [place].  If you have been instructed to have an in-person evaluation today at a local Urgent Care facility, please use the link below. It will take you to a list of all of our available [facility], including address, phone number and hours of operation. Please do not delay care.  [facility]  If you or a family member do not have a primary care provider, use the link below to schedule a visit and establish care. When you choose a [facility] primary care physician or advanced practice provider, you gain a long-term partner in health. Find a Primary Care Provider  Learn more about [facility] in-office and virtual care options: [facility]

## 2022-07-09 ENCOUNTER — Other Ambulatory Visit: Payer: Self-pay | Admitting: Podiatry

## 2022-07-18 DIAGNOSIS — R000 Tachycardia, unspecified: Secondary | ICD-10-CM | POA: Diagnosis not present

## 2022-07-27 ENCOUNTER — Telehealth (HOSPITAL_COMMUNITY): Payer: Self-pay | Admitting: Emergency Medicine

## 2022-07-27 ENCOUNTER — Telehealth (HOSPITAL_COMMUNITY): Payer: Self-pay | Admitting: *Deleted

## 2022-07-27 NOTE — Telephone Encounter (Signed)
Attempted to call patient regarding upcoming cardiac CT appointment. °Left message on voicemail with name and callback number °Shaneese Tait RN Navigator Cardiac Imaging °[facility] °[phone] Office °[phone] Cell ° °

## 2022-07-27 NOTE — Telephone Encounter (Signed)
Patient returning call regarding upcoming cardiac imaging study; pt verbalizes understanding of appt date/time, parking situation and where to check in,medications ordered, and verified current allergies; name and call back number provided for further questions should they arise  Gordy Clement RN Navigator Cardiac Imaging [facility] [phone] office [phone] cell  Patient to take '100mg'$  metoprolol tartrate two hours prior to his cardiac CT scan.  He is aware to arrive at 9:30am.

## 2022-07-29 ENCOUNTER — Ambulatory Visit (HOSPITAL_COMMUNITY)
Admission: RE | Admit: 2022-07-29 | Discharge: 2022-07-29 | Disposition: A | Discharge status: Home / Self Care | Payer: BC Managed Care – PPO | Source: Ambulatory Visit | Attending: Cardiology | Admitting: Cardiology

## 2022-07-29 DIAGNOSIS — I2089 Other forms of angina pectoris: Secondary | ICD-10-CM | POA: Diagnosis present

## 2022-07-29 DIAGNOSIS — I209 Angina pectoris, unspecified: Secondary | ICD-10-CM | POA: Insufficient documentation

## 2022-07-29 MED ORDER — NITROGLYCERIN 0.4 MG SL SUBL
0.8000 mg | SUBLINGUAL_TABLET | Freq: Once | SUBLINGUAL | Status: AC
  Administered 2022-07-29: 0.8 mg via SUBLINGUAL

## 2022-07-29 MED ORDER — IOHEXOL 350 MG/ML SOLN
100.0000 mL | Freq: Once | INTRAVENOUS | Status: AC | PRN
  Administered 2022-07-29: 100 mL via INTRAVENOUS

## 2022-07-29 MED ORDER — NITROGLYCERIN 0.4 MG SL SUBL
SUBLINGUAL_TABLET | SUBLINGUAL | Status: AC
  Filled 2022-07-29: qty 2

## 2022-08-08 ENCOUNTER — Telehealth: Payer: Self-pay | Admitting: Cardiology

## 2022-08-08 DIAGNOSIS — Z79899 Other long term (current) drug therapy: Secondary | ICD-10-CM

## 2022-08-08 DIAGNOSIS — I2510 Atherosclerotic heart disease of native coronary artery without angina pectoris: Secondary | ICD-10-CM

## 2022-08-08 MED ORDER — ROSUVASTATIN CALCIUM 10 MG PO TABS: 10.0000 mg | ORAL_TABLET | Freq: Every day | ORAL | 3 refills | Status: DC

## 2022-08-08 NOTE — Telephone Encounter (Signed)
Pt returning call for CT results  

## 2022-08-08 NOTE — Telephone Encounter (Signed)
Pt aware of results. Rx sent into [facility] as requested.  Lipid/ALT ordered and scheduled 10/10/2022.  Pt will call back if any questions or concerns.

## 2022-08-08 NOTE — Telephone Encounter (Signed)
  Mild non obstructive coronary artery disease. Recommend Crestor '10mg'$  PO once a day Check lipid panel and ALT in 2 months. Noted symmetric distal esophageal wall thickening by radiologist. During clinic encounter, no clinical evidence of esophagitis. Will forward to Dr. Yong Channel as well. Candee Furbish, MD

## 2022-09-20 ENCOUNTER — Encounter: Payer: Self-pay | Admitting: Family Medicine

## 2022-09-20 ENCOUNTER — Ambulatory Visit: Payer: BC Managed Care – PPO | Admitting: Family Medicine

## 2022-09-20 VITALS — BP 130/86 | HR 85 | Temp 97.8°F | Ht 70.0 in | Wt 245.6 lb

## 2022-09-20 DIAGNOSIS — R059 Cough, unspecified: Secondary | ICD-10-CM

## 2022-09-20 DIAGNOSIS — U071 COVID-19: Secondary | ICD-10-CM

## 2022-09-20 DIAGNOSIS — K229 Disease of esophagus, unspecified: Secondary | ICD-10-CM

## 2022-09-20 LAB — POCT INFLUENZA A/B
Influenza A, POC: NEGATIVE
Influenza B, POC: NEGATIVE

## 2022-09-20 LAB — POC COVID19 BINAXNOW: SARS Coronavirus 2 Ag: NEGATIVE

## 2022-09-20 MED ORDER — BENZONATATE 100 MG PO CAPS: 100.0000 mg | ORAL_CAPSULE | Freq: Two times a day (BID) | ORAL | 0 refills | Status: DC | PRN

## 2022-09-20 MED ORDER — AMOXICILLIN-POT CLAVULANATE 875-125 MG PO TABS: 1.0000 | ORAL_TABLET | Freq: Two times a day (BID) | ORAL | 0 refills | Status: AC

## 2022-09-20 MED ORDER — TRAMADOL HCL 50 MG PO TABS: 50.0000 mg | ORAL_TABLET | Freq: Four times a day (QID) | ORAL | 1 refills | Status: DC | PRN

## 2022-09-20 MED ORDER — METOPROLOL SUCCINATE ER 25 MG PO TB24: 25.0000 mg | ORAL_TABLET | Freq: Every day | ORAL | 3 refills | Status: DC

## 2022-09-20 NOTE — Patient Instructions (Addendum)
cough/congestion for now 10 days including sinus drainage- we will trial augmentin for bacterial sinusitis . If does not improve or worsens he will let me know- would need separate rx for azithromycin to cover walking pneumonia potential. If that didn't work- would need repeat eval in person.   Blood pressure technically acceptable but would prefer diastolic under 85 or even 80- we opted to trial metoprolol 25 mg XR for both blood pressure but also to see if would help with higher heart rates, PACs, and PVCs though rare -he will update me with home blood pressures, heart rates, and any side effects in 3-4 weeks (consider starting after sinuses are better)  Recommended follow up: Return for next already scheduled visit or sooner if needed.

## 2022-09-20 NOTE — Progress Notes (Signed)
Phone [phone] In person visit   Subjective:   John Costa is a 57 y.o. year old very pleasant male patient who presents for/with See problem oriented charting Chief Complaint  Patient presents with   Hypertension    Pt wants to discuss adding a beta blocker according to his cardiologist.   Weight Gain    Pt states he is not able to exercise due to heart condition.   Cough    Pt c/o since christmas eve, he has taken 3 COVID test (tues, Thursday and Saturday) all negative.   Past Medical History-  Patient Active Problem List   Diagnosis Date Noted   Aneurysm, carotid artery, internal 02/18/2011    Priority: High   Hypertension 05/21/2017    Priority: Medium    History of adenomatous polyp of colon 12/25/2015    Priority: Medium    Panic attacks 11/26/2014    Priority: Medium    Depression 03/14/2011    Priority: Medium    Gout 06/12/2007    Priority: Medium    Gangrenous cholecystitis s/p lap cholecystectomy 11/19/2017 11/18/2017    Priority: Low   Allergic rhinitis 11/26/2014    Priority: Low   Skin lesion 11/26/2014    Priority: Low   Nephrolithiasis 09/19/2010    Priority: Low   Pityriasis rosea 01/21/2008    Priority: Low   Gilbert's disease 06/12/2007    Priority: Low   Major depressive disorder with single episode, in full remission ([place]) 04/12/2022    Medications- reviewed and updated Current Outpatient Medications  Medication Sig Dispense Refill   acetaminophen (TYLENOL) 500 MG tablet Take 2 tablets (1,000 mg total) by mouth 3 (three) times daily. 30 tablet 0   allopurinol (ZYLOPRIM) 100 MG tablet Take 1 tablet (100 mg total) by mouth 2 (two) times daily. 180 tablet 3   amLODipine (NORVASC) 5 MG tablet Take 1 tablet (5 mg total) by mouth daily. 90 tablet 3   CALCIUM & MAGNESIUM CARBONATES PO Take 1 tablet by mouth daily.     clonazePAM (KLONOPIN) 0.5 MG tablet Take 1 tablet (0.5 mg total) by mouth 2 (two) times daily as needed for anxiety. 50  tablet 0   fluconazole (DIFLUCAN) 150 MG tablet TAKE 1 TABLET BY MOUTH ONE TIME PER WEEK 8 tablet 0   fluticasone (FLONASE) 50 MCG/ACT nasal spray Place 2 sprays into both nostrils daily.     L-THEANINE PO Take 1 tablet by mouth daily.     Multiple Vitamins-Minerals (ONE-A-DAY 50 PLUS PO) Take 1 tablet by mouth.     omeprazole (PRILOSEC) 20 MG capsule Take 20 mg by mouth daily.     Potassium 99 MG TABS Take 99 mg by mouth daily.     rosuvastatin (CRESTOR) 10 MG tablet Take 1 tablet (10 mg total) by mouth daily. 90 tablet 3   amoxicillin-clavulanate (AUGMENTIN) 875-125 MG tablet Take 1 tablet by mouth 2 (two) times daily for 7 days. 14 tablet 0   benzonatate (TESSALON) 100 MG capsule Take 1 capsule (100 mg total) by mouth 2 (two) times daily as needed for cough. 20 capsule 0   metoprolol succinate (TOPROL-XL) 25 MG 24 hr tablet Take 1 tablet (25 mg total) by mouth daily. 90 tablet 3   traMADol (ULTRAM) 50 MG tablet Take 1-2 tablets (50-100 mg total) by mouth every 6 (six) hours as needed for moderate pain or severe pain (from kidney stones or for headaches). 30 tablet 1   No current facility-administered medications for this  visit.     Objective:  BP 130/86   Pulse 85   Temp 97.8 F (36.6 C)   Ht '5\' 10"'$  (1.778 m)   Wt 245 lb 9.6 oz (111.4 kg)   SpO2 95%   BMI 35.24 kg/m  Gen: NAD, resting comfortably Mild erythema right tympanic membrane, left tympanic membrane normal, pharynx erythematous without exudate, right maxillary sinus tenderness noted, nasal turbinates erythematous and swollen with yellow discharge CV: RRR no murmurs rubs or gallops Lungs: CTAB no crackles, wheeze, rhonchi- some referred upper airway coarse sounds on exhalatoin Ext: no edema Skin: warm, dry  Results for orders placed or performed in visit on 09/20/22 (from the past 24 hour(s))  POCT Influenza A/B     Status: None   Collection Time: 09/20/22  9:37 AM  Result Value Ref Range   Influenza A, POC Negative  Negative   Influenza B, POC Negative Negative  POC COVID-19     Status: None   Collection Time: 09/20/22  9:37 AM  Result Value Ref Range   SARS Coronavirus 2 Ag Negative Negative       Assessment and Plan   # Cough/congestion S: Symptoms started on Christmas Eve-day 10 of symptoms today.  3 COVID tests at home have been negative.  COVID and flu today are negative.  He had COVID in October.  -fair amount of sinus drainage- dark at first but bright yellow now -fever earlier in illness but not now - no increased shortness of breath from baseline -coughing fits at times -no decongestants - no significant improvement in symptoms A/P: cough/congestion for now 10 days including sinus drainage- we will trial augmentin for bacterial sinusitis . If does not improve or worsens he will let me know- would need separate rx for azithromycin to cover walking pneumonia potential. If that didn't work- would need repeat eval in person.    #hypertension S: medication: amlodipine 5 mg -Cardiology has discussed adding a beta-blocker with him-we had referred him back in July with weight gain, shortness of breath, tachycardia -On cardiac event monitor did have brief episodes of atrial tachycardia which were considered benign, rare PACs, rare PVCs.  No atrial fibrillation.  Did have occasional sinus tachycardia -has tried to reduce coffee -has improved diet other than last 2 weeks- weight up 9 lbs Wt Readings from Last 3 Encounters:  09/20/22 245 lb 9.6 oz (111.4 kg)  06/17/22 239 lb (108.4 kg)  04/12/22 236 lb 9.6 oz (107.3 kg)  Home readings #s: up until thanksgiving 62-56 diastolic and systolic was 389-373 BP Readings from Last 3 Encounters:  09/20/22 130/86  07/29/22 126/76  06/17/22 112/80  A/P: Blood pressure technically acceptable but would prefer diastolic under 85 or even 80- we opted to trial metoprolol 25 mg XR for both blood pressure but also to see if would help with higher heart rates,  PACs, and PVCs though rare -he will update me with home blood pressures, heart rates, and any side effects in 3-4 weeks (consider starting after sinuses are better) - has been walking until has cough -he is strongly consider sagewell  #hyperlipidemia # Mild nonobstructive coronary artery disease noted by cardiology-score of 6 which is 50th percentile S: Medication:Crestor 10 mg daily started by cardiology. Some myalgias but has improved after first week Lab Results  Component Value Date   CHOL 171 04/18/2022   HDL 46.20 04/18/2022   LDLCALC 101 (H) 04/18/2022   LDLDIRECT 144.1 07/22/2013   TRIG 120.0 04/18/2022   CHOLHDL  4 04/18/2022   A/P: lipids likely improved- has upcoming labs with cardiology to check and we can also do full labs if needed at CPE   # Incidental finding-symmetric distal esophageal wall thickening  -still on omeprazole 20 mg- with this finding want to get opinion of GI- will be due for colonoscopy next year so not yet due on that front but if needed EGD would ask if they could consider colonoscopy as well  Recommended follow up: Return for next already scheduled visit or sooner if needed. Future Appointments  Date Time Provider [place]  10/10/2022  8:45 AM [facility]  10/14/2022  8:00 AM Marin Olp, MD [facility]   Lab/Order associations:   ICD-10-CM   1. Cough, unspecified type  R05.9 POC COVID-19    POCT Influenza A/B    2. COVID-19  U07.1 benzonatate (TESSALON) 100 MG capsule    3. Esophageal abnormality  K22.9 Ambulatory referral to Gastroenterology      Meds ordered this encounter  Medications   benzonatate (TESSALON) 100 MG capsule    Sig: Take 1 capsule (100 mg total) by mouth 2 (two) times daily as needed for cough.    Dispense:  20 capsule    Refill:  0   traMADol (ULTRAM) 50 MG tablet    Sig: Take 1-2 tablets (50-100 mg total) by mouth every 6 (six) hours as needed for moderate pain or severe  pain (from kidney stones or for headaches).    Dispense:  30 tablet    Refill:  1   amoxicillin-clavulanate (AUGMENTIN) 875-125 MG tablet    Sig: Take 1 tablet by mouth 2 (two) times daily for 7 days.    Dispense:  14 tablet    Refill:  0   metoprolol succinate (TOPROL-XL) 25 MG 24 hr tablet    Sig: Take 1 tablet (25 mg total) by mouth daily.    Dispense:  90 tablet    Refill:  3    Return precautions advised.  Garret Reddish, MD

## 2022-09-27 ENCOUNTER — Ambulatory Visit: Payer: BC Managed Care – PPO | Admitting: Family Medicine

## 2022-10-10 ENCOUNTER — Ambulatory Visit: Payer: BC Managed Care – PPO | Attending: Cardiology

## 2022-10-10 DIAGNOSIS — Z79899 Other long term (current) drug therapy: Secondary | ICD-10-CM

## 2022-10-10 DIAGNOSIS — I2510 Atherosclerotic heart disease of native coronary artery without angina pectoris: Secondary | ICD-10-CM

## 2022-10-10 LAB — ALT: ALT: 22 IU/L (ref 0–44)

## 2022-10-10 LAB — LIPID PANEL
Chol/HDL Ratio: 2.6 ratio (ref 0.0–5.0)
Cholesterol, Total: 136 mg/dL (ref 100–199)
HDL: 53 mg/dL (ref >39)
LDL Chol Calc (NIH): 61 mg/dL (ref 0–99)
Triglycerides: 124 mg/dL (ref 0–149)
VLDL Cholesterol Cal: 22 mg/dL (ref 5–40)

## 2022-10-14 ENCOUNTER — Ambulatory Visit (INDEPENDENT_AMBULATORY_CARE_PROVIDER_SITE_OTHER): Payer: BC Managed Care – PPO | Admitting: Family Medicine

## 2022-10-14 ENCOUNTER — Encounter: Payer: Self-pay | Admitting: Family Medicine

## 2022-10-14 VITALS — BP 124/78 | HR 85 | Temp 97.6°F | Ht 70.0 in | Wt 250.8 lb

## 2022-10-14 DIAGNOSIS — Z79899 Other long term (current) drug therapy: Secondary | ICD-10-CM

## 2022-10-14 DIAGNOSIS — Z0000 Encounter for general adult medical examination without abnormal findings: Secondary | ICD-10-CM | POA: Diagnosis not present

## 2022-10-14 DIAGNOSIS — M1009 Idiopathic gout, multiple sites: Secondary | ICD-10-CM

## 2022-10-14 DIAGNOSIS — E785 Hyperlipidemia, unspecified: Secondary | ICD-10-CM | POA: Diagnosis not present

## 2022-10-14 DIAGNOSIS — Z125 Encounter for screening for malignant neoplasm of prostate: Secondary | ICD-10-CM | POA: Diagnosis not present

## 2022-10-14 NOTE — Progress Notes (Signed)
Phone: [phone]   Subjective:  Patient presents today for their annual physical. Chief complaint-noted.   See problem oriented charting- ROS- full  review of systems was completed and negative  except for: seasonal allergies- post nasal drip, runny nose, sinus pressure, sneezing, trouble swallowing- similar in 2006- feels mild trouble swallowing- but referred to GI, ongoing shortness of breath mainly on rower, joint pain, muscle aches, neck pain, neck stiffness, headaches  The following were reviewed and entered/updated in epic: Past Medical History:  Diagnosis Date   Allergy    Anal fissure    Anemia    History with gallbladder   Anxiety    Cataract    removed bilateral   Cerebral aneurysm    CHOLELITHIASIS 06/12/2007   Qualifier: Diagnosis of  By: Sherren Mocha, RN, Dorian Pod     Depression    no treatment   GERD (gastroesophageal reflux disease)    occasional   Gilbert's syndrome    Gout    Headache(784.0)    from cerebral aneurysm  2008   HTN (hypertension)    Migraines    rare   Nephrolithiasis 09/19/2010   Pelvic kidney    Pneumonia    2010   PONV (postoperative nausea and vomiting)    Patient Active Problem List   Diagnosis Date Noted   Aneurysm, carotid artery, internal 02/18/2011    Priority: High   Hyperlipidemia, unspecified 10/14/2022    Priority: Medium    Hypertension 05/21/2017    Priority: Medium    History of adenomatous polyp of colon 12/25/2015    Priority: Medium    Panic attacks 11/26/2014    Priority: Medium    Depression 03/14/2011    Priority: Medium    Gout 06/12/2007    Priority: Medium    Gangrenous cholecystitis s/p lap cholecystectomy 11/19/2017 11/18/2017    Priority: Low   Allergic rhinitis 11/26/2014    Priority: Low   Skin lesion 11/26/2014    Priority: Low   Nephrolithiasis 09/19/2010    Priority: Low   Pityriasis rosea 01/21/2008    Priority: Low   Gilbert's disease 06/12/2007    Priority: Low   Major depressive disorder with  single episode, in full remission ([place]) 04/12/2022   Past Surgical History:  Procedure Laterality Date   ARTERIAL ANEURYSM REPAIR  2008   CATARACT EXTRACTION     cataracts with lens implants   CHOLECYSTECTOMY N/A 11/19/2017   Procedure: LAPAROSCOPIC CHOLECYSTECTOMY;  Surgeon: Excell Seltzer, MD;  Location: [facility];  Service: General;  Laterality: N/A;   CYSTOSCOPY WITH RETROGRADE PYELOGRAM, URETEROSCOPY AND STENT PLACEMENT Left 08/24/2015   Procedure: CYSTOSCOPY WITHLEFT RETROGRADE PYELOGRAM, URETEROSCOPY AND STENT PLACEMENT;  Surgeon: Raynelle Bring, MD;  Location: [facility];  Service: Urology;  Laterality: Left;   CYSTOSCOPY WITH RETROGRADE PYELOGRAM, URETEROSCOPY AND STENT PLACEMENT Left 11/05/2020   Procedure: CYSTOSCOPY WITH RETROGRADE PYELOGRAM, URETEROSCOPY AND STENT PLACEMENT;  Surgeon: Raynelle Bring, MD;  Location: [facility];  Service: Urology;  Laterality: Left;  75 MINS   EXTRACORPOREAL SHOCK WAVE LITHOTRIPSY Left 06/24/2021   Procedure: EXTRACORPOREAL SHOCK WAVE LITHOTRIPSY (ESWL);  Surgeon: Festus Aloe, MD;  [facility];  Service: Urology;  Laterality: Left;   HOLMIUM LASER APPLICATION Left 59/01/6386   Procedure:  WITH HOLMIUM LASER LITHOTRIPSY;  Surgeon: Raynelle Bring, MD;  Location: [facility];  Service: Urology;  Laterality: Left;   HOLMIUM LASER APPLICATION Left 5/64/3329   Procedure: HOLMIUM LASER APPLICATION;  Surgeon: Raynelle Bring, MD;  Location: [facility];  Service: Urology;  Laterality: Left;   knee arthroscopies Bilateral    has had this several times   STONE EXTRACTION WITH BASKET  12/30/10   UPPER GASTROINTESTINAL ENDOSCOPY      Family History  Problem Relation Age of Onset   Prostate cancer Father 4   Diabetes Brother    Hemochromatosis Brother    Diabetes Sister    Hemachromatosis Sister    Colon cancer Neg Hx    Colon polyps Neg Hx     Medications- reviewed and updated Current Outpatient Medications  Medication Sig Dispense Refill    acetaminophen (TYLENOL) 500 MG tablet Take 2 tablets (1,000 mg total) by mouth 3 (three) times daily. 30 tablet 0   allopurinol (ZYLOPRIM) 100 MG tablet Take 1 tablet (100 mg total) by mouth 2 (two) times daily. 180 tablet 3   amLODipine (NORVASC) 5 MG tablet Take 1 tablet (5 mg total) by mouth daily. 90 tablet 3   CALCIUM & MAGNESIUM CARBONATES PO Take 1 tablet by mouth daily.     clonazePAM (KLONOPIN) 0.5 MG tablet Take 1 tablet (0.5 mg total) by mouth 2 (two) times daily as needed for anxiety. 50 tablet 0   fluconazole (DIFLUCAN) 150 MG tablet TAKE 1 TABLET BY MOUTH ONE TIME PER WEEK 8 tablet 0   fluticasone (FLONASE) 50 MCG/ACT nasal spray Place 2 sprays into both nostrils daily.     L-THEANINE PO Take 1 tablet by mouth daily.     metoprolol succinate (TOPROL-XL) 25 MG 24 hr tablet Take 1 tablet (25 mg total) by mouth daily. 90 tablet 3   Multiple Vitamins-Minerals (ONE-A-DAY 50 PLUS PO) Take 1 tablet by mouth.     omeprazole (PRILOSEC) 20 MG capsule Take 20 mg by mouth daily.     Potassium 99 MG TABS Take 99 mg by mouth daily.     rosuvastatin (CRESTOR) 10 MG tablet Take 1 tablet (10 mg total) by mouth daily. 90 tablet 3   traMADol (ULTRAM) 50 MG tablet Take 1-2 tablets (50-100 mg total) by mouth every 6 (six) hours as needed for moderate pain or severe pain (from kidney stones or for headaches). 30 tablet 1   No current facility-administered medications for this visit.    Allergies-reviewed and updated Allergies  Allergen Reactions   Cheese Anaphylaxis    Social History   Social History Narrative   Married (wife patient elsewhere). 3 children- daughter 58, Arrie [place] 57, Shawn 08 October 2020.       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       Hobbies: play golf, reading, walking, swimming   Objective  Objective:  BP 124/78   Pulse 85   Temp 97.6 F (36.4 C)   Ht '5\' 10"'$  (1.778 m)   Wt 250 lb 12.8 oz (113.8 kg)   SpO2  96%   BMI 35.99 kg/m  Gen: NAD, resting comfortably HEENT: Mucous membranes are moist. Oropharynx normal Neck: no thyromegaly CV: RRR no murmurs rubs or gallops Lungs: CTAB no crackles, wheeze, rhonchi Abdomen: soft/nontender/nondistended/normal bowel sounds. No rebound or guarding.  Ext: no edema Skin: warm, dry Neuro: grossly normal, moves all extremities, PERRLA    Assessment and Plan  57 y.o. male presenting for annual physical.  Health Maintenance counseling: 1. Anticipatory guidance: Patient counseled regarding regular dental exams -q6 months, eye exams -yearly- prior cataract surgery still doing well,  avoiding smoking and second hand smoke , limiting alcohol to 2 beverages per  day, no illicit drugs .   2. Risk factor reduction:  Advised patient of need for regular exercise and diet rich and fruits and vegetables to reduce risk of heart attack and stroke.  Exercise- doing well with walks but has had to pull off rower due to shortness of breath- plans to check out sagewell to look at other options.  Diet/weight management-in the last 2 years he is up 19 pounds-also with gradual weight gain from last visit- feels eats healthy so this is really concerning to him, we have check tsh which was not elevated. Doing intermittent fasting still and eating clean as well. Has also counted calories and not in excess -offered healthy weight to wellness - he will start with sagewell first and reach out if needed.  Wt Readings from Last 3 Encounters:  10/14/22 250 lb 12.8 oz (113.8 kg)  09/20/22 245 lb 9.6 oz (111.4 kg)  06/17/22 239 lb (108.4 kg)  3. Immunizations/screenings/ancillary studies-had COVID-19 in [place] off on vaccination  Immunization History  Administered Date(s) Administered   Influenza Inj Mdck Quad With Preservative 07/17/2019   Influenza,inj,Quad PF,6+ Mos 06/24/2013, 06/12/2014, 07/03/2015, 08/03/2018, 10/02/2020, 07/19/2021, 06/14/2022   Influenza-Unspecified  06/29/2016, 06/26/2017, 07/17/2019   PFIZER(Purple Top)SARS-COV-2 Vaccination 12/10/2019, 01/01/2020, 08/24/2020   Td 05/16/2006   Tdap 08/20/2019   Zoster Recombinat (Shingrix) 04/12/2022, 06/14/2022   4. Prostate cancer screening- low risk prior PSA trend-continue to follow annually.  Some hesitancy when starting stream especially with full bladder . We did miss 2023 on testing though. Dad with prostate cancer so want to watch closely.  Lab Results  Component Value Date   PSA 1.54 10/02/2020   PSA 1.40 08/20/2019   PSA 1.25 12/01/2014   5. Colon cancer screening - 12/17/2015 with 7 year repeat planned - due in march- referral in place for possible EGD but hoping they can do both  6. Skin cancer screening- considering scheduling follow up.  advised regular sunscreen use. Denies worrisome, changing, or new skin lesions.  7. never smoker  8. STD screening - only active with wife   Status of chronic or acute concerns   # Dyspnea on exertion/chest pain with thrombus seen-has seen Dr. Marlou Porch this year.  Also was dealing with palpitations.  Cardiac monitor showed rare PACs and PVCs with racing heartbeat associated with sinus tachycardia but overall reassuring results. Fewer palpitations on metoprolol now.  Also on 07/29/2022 had CT coronary morphology-coronary calcium score of 6 and nonobstructive CAD all under 50%  -Incidental finding of symmetric distal esophageal wall thickening -We placed a referral to gastroenterologyto discuss this finding further-possible EGD and also will be due for colonoscopy in march.   # Aortic dilation-also incidental finding during workup 38 mm ascending aorta which is borderline-we have discussed controlling blood pressure  #hypertension S: medication: Amlodipine 5 mg PM, metoprolol 25 mg extended release am BP Readings from Last 3 Encounters:  10/14/22 124/78  09/20/22 130/86  07/29/22 126/76  A/P: stable- continue current medicines    #hyperlipidemia #nonobstructive CAD S: Medication:rosuvastatin '10mg'$   Lab Results  Component Value Date   CHOL 136 10/10/2022   HDL 53 10/10/2022   LDLCALC 61 10/10/2022   LDLDIRECT 144.1 07/22/2013   TRIG 124 10/10/2022   CHOLHDL 2.6 10/10/2022   A/P: excellent control for nonobstructive CAD  # Depression/anxiety with panic attacks S: Medication:Doing well without daily medication-does take clonazepam as needed for anxiety- once a quarter     09/20/2022    9:21 AM 04/12/2022  9:58 AM 01/25/2021    1:20 PM  Depression screen PHQ 2/9  Decreased Interest 0 0 0  Down, Depressed, Hopeless 0 0 0  PHQ - 2 Score 0 0 0  Altered sleeping 0 0 0  Tired, decreased energy 0 0 0  Change in appetite 0 0 0  Feeling bad or failure about yourself  0 0 0  Trouble concentrating 0 0 0  Moving slowly or fidgety/restless 0 0 0  Suicidal thoughts 0 0 0  PHQ-9 Score 0 0 0  Difficult doing work/chores Not difficult at all Not difficult at all Not difficult at all  A/P: doing very well overall- continue current medications    #Gout S: Medication: Allopurinol 100 mg Lab Results  Component Value Date   LABURIC 6.6 04/18/2022  A/p: no flares and checked in July- will check at least every 12-18 months  # GERD S:Medication: Omeprazole 20 mg daily- no breakthrough symptoms A/P: stable- continue current medicines   -Offered B12 check- opts in   # Nephrolithiasis-on potassium through urology-we have prescribed tramadol as needed for kidney stones but also helps with headaches when they occur-prefer to avoid NSAIDs with his hypertension. Uses tramadol once a quarter for a few days -gets urines with urology   Recommended follow up: No follow-ups on file.  Lab/Order associations: fasting   ICD-10-CM   1. Preventative health care  Z00.00 CBC with Differential/Platelet    Comprehensive metabolic panel    PSA    Vitamin B12    2. Idiopathic gout of multiple sites, unspecified chronicity  M10.09      3. Hyperlipidemia, unspecified hyperlipidemia type  E78.5 CBC with Differential/Platelet    Comprehensive metabolic panel    4. Screening for prostate cancer  Z12.5 PSA    5. High risk medication use  Z79.899 Vitamin B12     Return precautions advised.  Garret Reddish, MD

## 2022-10-14 NOTE — Patient Instructions (Addendum)
[facility] contact- may be faster if you call Please call to schedule visit and/or procedure Address: [address], [place], [place] [address] Phone: [phone]   Please stop by lab before you go If you have mychart- we will send your results within 3 business days of [place] receiving them.  If you do not have mychart- we will call you about results within 5 business days of [place] receiving them.  *please also note that you will see labs on mychart as soon as they post. I will later go in and write notes on them- will say "notes from Dr. Yong Channel"   Recommended follow up: Return in about 6 months (around 04/14/2023) for followup or sooner if needed.Schedule b4 you leave.

## 2022-10-20 ENCOUNTER — Other Ambulatory Visit: Payer: BC Managed Care – PPO

## 2022-10-25 ENCOUNTER — Other Ambulatory Visit (INDEPENDENT_AMBULATORY_CARE_PROVIDER_SITE_OTHER): Payer: BC Managed Care – PPO

## 2022-10-25 ENCOUNTER — Other Ambulatory Visit: Payer: Self-pay

## 2022-10-25 DIAGNOSIS — Z0000 Encounter for general adult medical examination without abnormal findings: Secondary | ICD-10-CM | POA: Diagnosis not present

## 2022-10-25 DIAGNOSIS — I10 Essential (primary) hypertension: Secondary | ICD-10-CM

## 2022-10-25 DIAGNOSIS — Z125 Encounter for screening for malignant neoplasm of prostate: Secondary | ICD-10-CM

## 2022-10-25 DIAGNOSIS — Z79899 Other long term (current) drug therapy: Secondary | ICD-10-CM

## 2022-10-25 DIAGNOSIS — E785 Hyperlipidemia, unspecified: Secondary | ICD-10-CM | POA: Diagnosis not present

## 2022-10-25 LAB — COMPREHENSIVE METABOLIC PANEL
ALT: 28 U/L (ref 0–53)
AST: 25 U/L (ref 0–37)
Albumin: 4.6 g/dL (ref 3.5–5.2)
Alkaline Phosphatase: 59 U/L (ref 39–117)
BUN: 13 mg/dL (ref 6–23)
CO2: 24 mEq/L (ref 19–32)
Calcium: 9.5 mg/dL (ref 8.4–10.5)
Chloride: 103 mEq/L (ref 96–112)
Creatinine, Ser: 0.94 mg/dL (ref 0.40–1.50)
GFR: 90.92 mL/min (ref >60.00)
Glucose, Bld: 133 mg/dL — ABNORMAL HIGH (ref 70–99)
Potassium: 4.1 mEq/L (ref 3.5–5.1)
Sodium: 140 mEq/L (ref 135–145)
Total Bilirubin: 1.7 mg/dL — ABNORMAL HIGH (ref 0.2–1.2)
Total Protein: 7.6 g/dL (ref 6.0–8.3)

## 2022-10-25 LAB — CBC WITH DIFFERENTIAL/PLATELET
Basophils Absolute: 0 10*3/uL (ref 0.0–0.1)
Basophils Relative: 0.5 % (ref 0.0–3.0)
Eosinophils Absolute: 0.1 10*3/uL (ref 0.0–0.7)
Eosinophils Relative: 1.4 % (ref 0.0–5.0)
HCT: 43.7 % (ref 39.0–52.0)
Hemoglobin: 14.9 g/dL (ref 13.0–17.0)
Lymphocytes Relative: 23 % (ref 12.0–46.0)
Lymphs Abs: 1.7 10*3/uL (ref 0.7–4.0)
MCHC: 34.2 g/dL (ref 30.0–36.0)
MCV: 87.3 fl (ref 78.0–100.0)
Monocytes Absolute: 0.3 10*3/uL (ref 0.1–1.0)
Monocytes Relative: 4.7 % (ref 3.0–12.0)
Neutro Abs: 5.3 10*3/uL (ref 1.4–7.7)
Neutrophils Relative %: 70.4 % (ref 43.0–77.0)
Platelets: 211 10*3/uL (ref 150.0–400.0)
RBC: 5.01 Mil/uL (ref 4.22–5.81)
RDW: 17.1 % — ABNORMAL HIGH (ref 11.5–15.5)
WBC: 7.5 10*3/uL (ref 4.0–10.5)

## 2022-10-25 LAB — PSA: PSA: 1.2 ng/mL (ref 0.10–4.00)

## 2022-10-25 LAB — VITAMIN B12: Vitamin B-12: 453 pg/mL (ref 211–911)

## 2022-10-25 NOTE — Addendum Note (Signed)
Addended by: Doran Clay A on: 10/25/2022 10:57 AM   Modules accepted: Orders

## 2022-10-30 ENCOUNTER — Other Ambulatory Visit: Payer: Self-pay | Admitting: Podiatry

## 2022-12-08 IMAGING — DX DG ABDOMEN 1V
2 series · 2 of 2 positions shown · non-contrast
Comparison: [DATE] [DATE], [DATE].  [DATE] [DATE], [DATE].

CLINICAL DATA: Left renal stone.

EXAM:
ABDOMEN - 1 VIEW

[abdomen kub (1 of 2)]
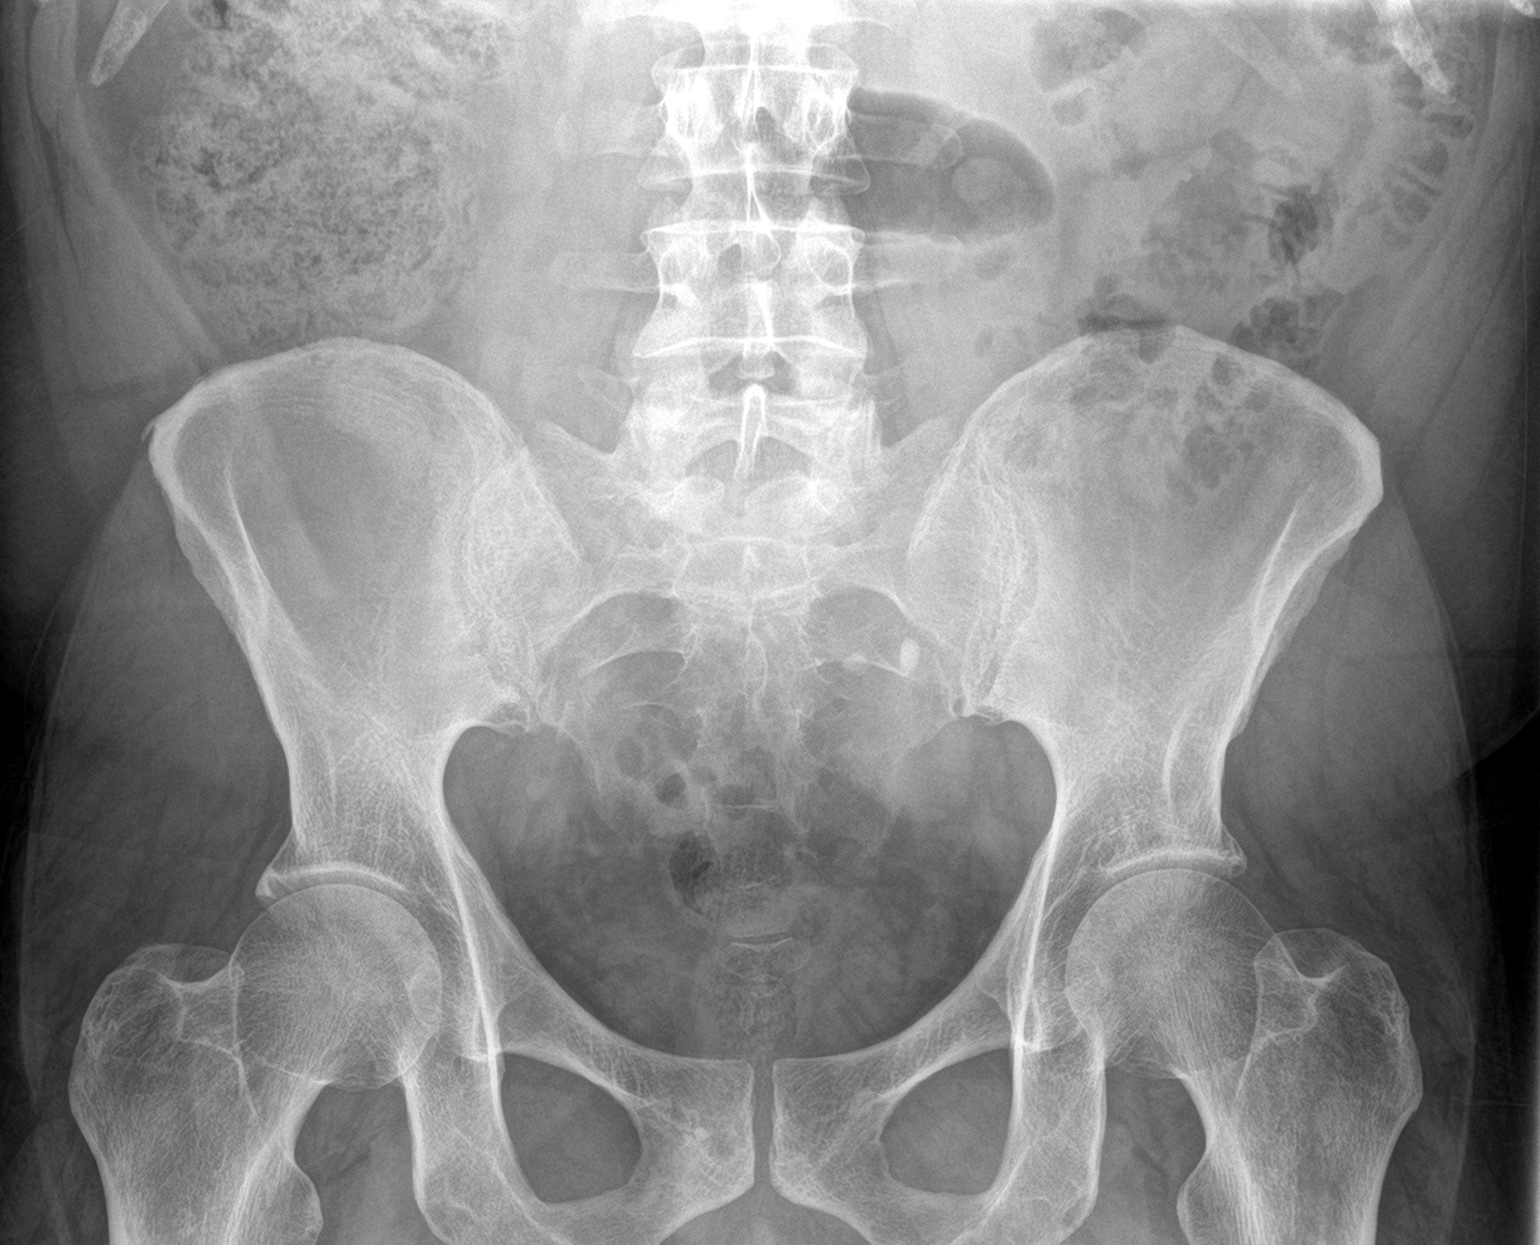

[abdomen kub (2 of 2)]
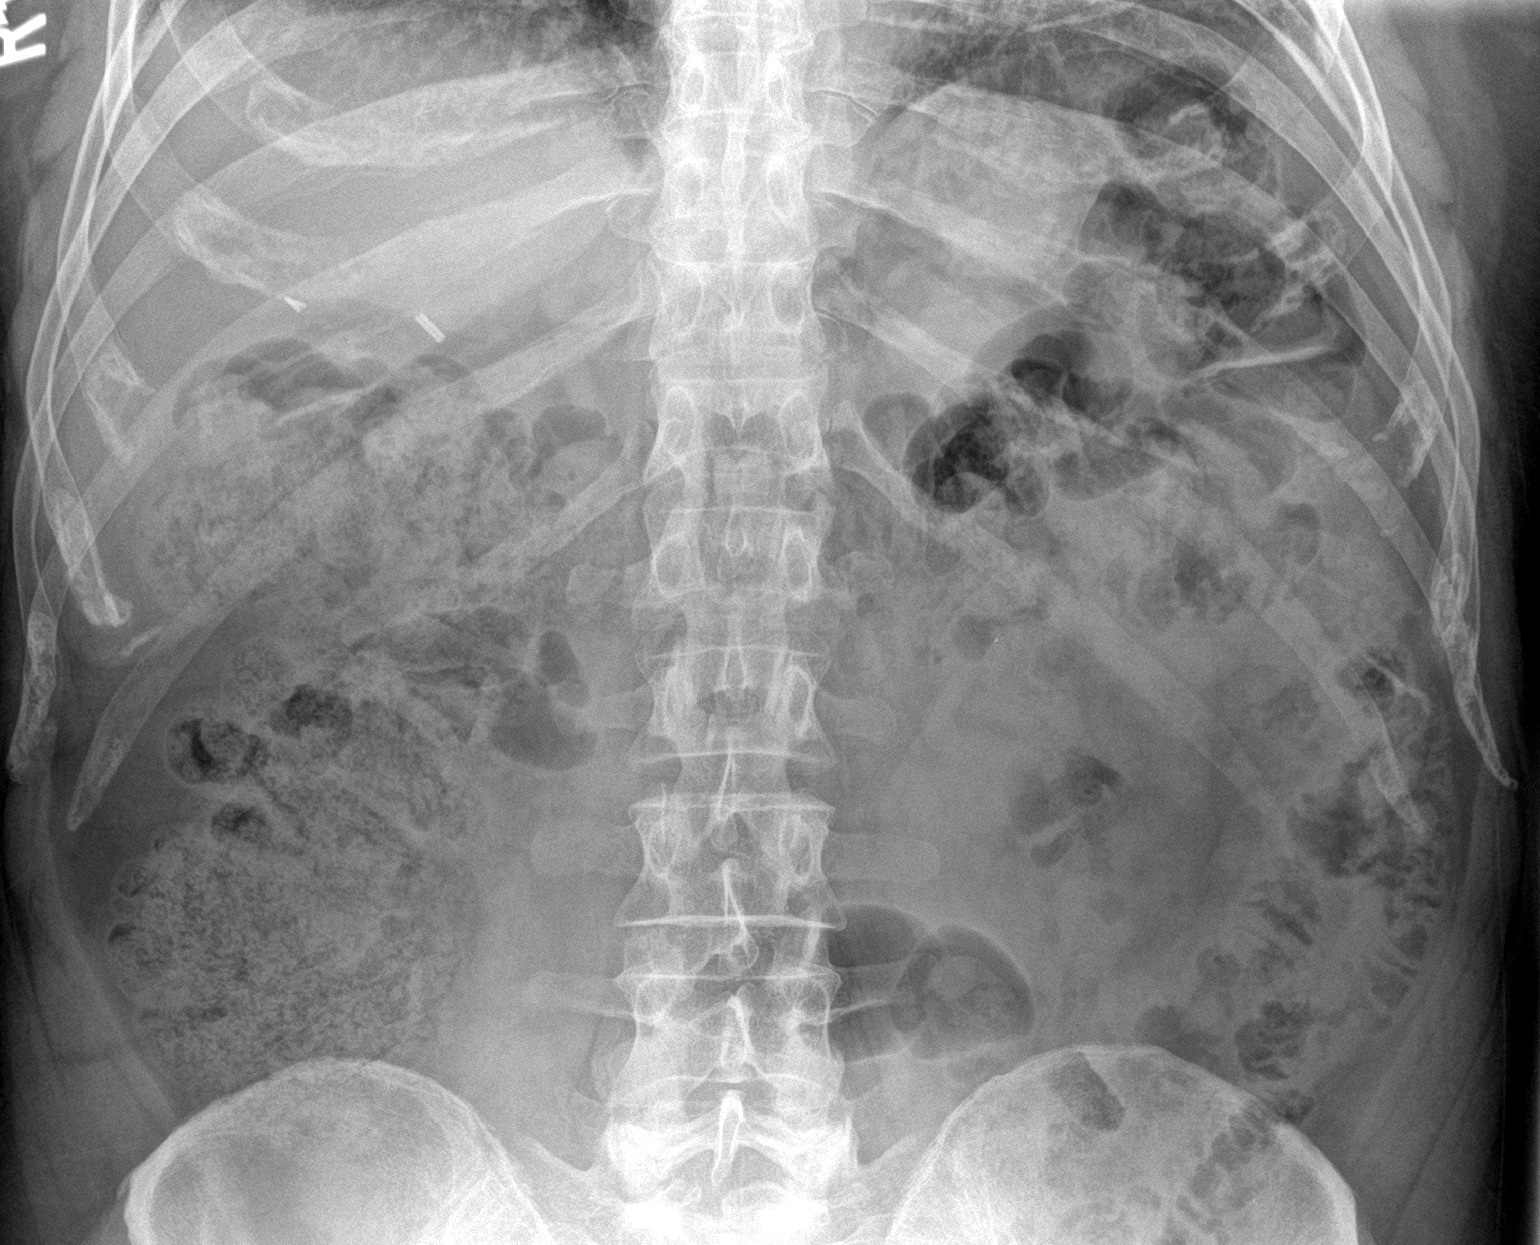

[2 of 2 positions shown; findings below may reference images not displayed]

FINDINGS: The bowel gas pattern is normal. Status post cholecystectomy. Two
rounded calcifications are seen over the left sacrum most consistent
with left renal calculi.
IMPRESSION: Two rounded calcifications are seen over the left sacrum most
consistent with left renal calculi.

## 2023-01-03 ENCOUNTER — Encounter: Payer: Self-pay | Admitting: Internal Medicine

## 2023-02-16 ENCOUNTER — Ambulatory Visit: Payer: BC Managed Care – PPO | Admitting: Podiatry

## 2023-03-07 ENCOUNTER — Ambulatory Visit: Payer: BC Managed Care – PPO | Admitting: Podiatry

## 2023-03-07 DIAGNOSIS — Z79899 Other long term (current) drug therapy: Secondary | ICD-10-CM

## 2023-03-07 DIAGNOSIS — B351 Tinea unguium: Secondary | ICD-10-CM | POA: Diagnosis not present

## 2023-03-07 MED ORDER — FLUCONAZOLE 150 MG PO TABS: 150.0000 mg | ORAL_TABLET | ORAL | 0 refills | Status: DC

## 2023-03-11 LAB — CBC WITH DIFFERENTIAL/PLATELET
Basophils Absolute: 0 10*3/uL (ref 0.0–0.2)
Basos: 1 %
EOS (ABSOLUTE): 0.1 10*3/uL (ref 0.0–0.4)
Eos: 1 %
Hematocrit: 44.9 % (ref 37.5–51.0)
Hemoglobin: 14.9 g/dL (ref 13.0–17.7)
Immature Grans (Abs): 0 10*3/uL (ref 0.0–0.1)
Immature Granulocytes: 0 %
Lymphocytes Absolute: 1.6 10*3/uL (ref 0.7–3.1)
Lymphs: 22 %
MCH: 29.3 pg (ref 26.6–33.0)
MCHC: 33.2 g/dL (ref 31.5–35.7)
MCV: 88 fL (ref 79–97)
Monocytes Absolute: 0.4 10*3/uL (ref 0.1–0.9)
Monocytes: 6 %
Neutrophils Absolute: 4.9 10*3/uL (ref 1.4–7.0)
Neutrophils: 70 %
Platelets: 234 10*3/uL (ref 150–450)
RBC: 5.08 x10E6/uL (ref 4.14–5.80)
RDW: 14.8 % (ref 11.6–15.4)
WBC: 7 10*3/uL (ref 3.4–10.8)

## 2023-03-11 LAB — HEPATIC FUNCTION PANEL
ALT: 40 IU/L (ref 0–44)
AST: 36 IU/L (ref 0–40)
Albumin: 4.6 g/dL (ref 3.8–4.9)
Alkaline Phosphatase: 77 IU/L (ref 44–121)
Bilirubin Total: 1.5 mg/dL — ABNORMAL HIGH (ref 0.0–1.2)
Bilirubin, Direct: 0.4 mg/dL (ref 0.00–0.40)
Total Protein: 7.7 g/dL (ref 6.0–8.5)

## 2023-03-13 ENCOUNTER — Ambulatory Visit: Payer: BC Managed Care – PPO | Admitting: Podiatry

## 2023-03-14 NOTE — Progress Notes (Signed)
Subjective: Chief Complaint  Patient presents with   Nail Problem    Patient states that he has been on the Lamisil a little over a year and if he stops taking it the fungus comes back on that area. He would like to try something aggressive.     57 year old male presents the office for above concerns.  He states he is on the fluconazole without any side effects.  Were not able to do the Lamisil given the Gilbert's syndrome that he has.  He states that once he comes off the medication of that the fungus comes back.  No swelling or redness or drainage.  No other concerns.  Objective: AAO x3, NAD DP/PT pulses palpable bilaterally, CRT less than 3 seconds Nails continue to be discolored with yellow discoloration noted but some clearing present.  There is no edema, erythema or signs of infection.  No open lesions. No pain with calf compression, swelling, warmth, erythema  Assessment: Onychomycosis  Plan: -All treatment options discussed with the patient including all alternatives, risks, complications.  -Discussed treatment options.  Continue fluconazole.  Will recheck CBC and LFT.Marland Kitchen  Medications as above.  Discussed external measures help with fungus including shoe, such as regularly.  He can also use diluted vinegar soaking as needed. -Patient encouraged to call the office with any questions, concerns, change in symptoms.   Vivi Barrack DPM

## 2023-03-29 ENCOUNTER — Encounter: Payer: Self-pay | Admitting: Podiatry

## 2023-03-29 ENCOUNTER — Other Ambulatory Visit: Payer: Self-pay | Admitting: Podiatry

## 2023-03-29 MED ORDER — TERBINAFINE HCL 1 % EX CREA: 1.0000 | TOPICAL_CREAM | Freq: Two times a day (BID) | CUTANEOUS | 0 refills | Status: AC

## 2023-03-30 ENCOUNTER — Other Ambulatory Visit: Payer: Self-pay | Admitting: Podiatry

## 2023-03-30 MED ORDER — FLUCONAZOLE 150 MG PO TABS: 150.0000 mg | ORAL_TABLET | ORAL | 0 refills | Status: DC

## 2023-04-03 ENCOUNTER — Other Ambulatory Visit: Payer: Self-pay | Admitting: Family Medicine

## 2023-04-06 ENCOUNTER — Telehealth: Payer: Self-pay | Admitting: Family Medicine

## 2023-04-06 ENCOUNTER — Other Ambulatory Visit: Payer: Self-pay | Admitting: Family Medicine

## 2023-04-06 NOTE — Telephone Encounter (Signed)
Message sent to pt via mychart and pt has responded.

## 2023-04-06 NOTE — Telephone Encounter (Signed)
Patient states he has been to a bat colony in his attic. Has cleaned their guano and is concerned about contracting possible disease (states CDC recommends PEP shots if certain criteria is met). Please advise.

## 2023-04-06 NOTE — Telephone Encounter (Signed)
It sounds like he has been exposed to bats and that all bats he may have had contact with are not in position to be quarantined and tested- in this case he will need PEP- its my understanding that [facility] now have access to this from recent email "Rabies Immunoglobulin Great news - rabies immunoglobulin is available at all [facility].  Please send your patients here versus the emergency department if the wound does not warrant an ED visit.  " -I would recommend immediately going to one of these clinics as rabies can be deadly and if he has been near bats we cannot guarantee he has not had bite/scratch

## 2023-04-06 NOTE — Telephone Encounter (Signed)
Do you want to do a virtual for this or can this be addressed w/o a visit?

## 2023-04-07 ENCOUNTER — Encounter (HOSPITAL_COMMUNITY): Payer: Self-pay

## 2023-04-07 ENCOUNTER — Ambulatory Visit (HOSPITAL_COMMUNITY)
Admission: RE | Admit: 2023-04-07 | Discharge: 2023-04-07 | Disposition: A | Discharge status: Home / Self Care | Payer: BC Managed Care – PPO | Source: Ambulatory Visit | Attending: Emergency Medicine | Admitting: Emergency Medicine

## 2023-04-07 VITALS — BP 132/86 | HR 75 | Temp 98.8°F | Resp 16 | Wt 248.0 lb

## 2023-04-07 DIAGNOSIS — Z203 Contact with and (suspected) exposure to rabies: Secondary | ICD-10-CM

## 2023-04-07 DIAGNOSIS — Z23 Encounter for immunization: Secondary | ICD-10-CM

## 2023-04-07 DIAGNOSIS — Z209 Contact with and (suspected) exposure to unspecified communicable disease: Secondary | ICD-10-CM

## 2023-04-07 MED ORDER — RABIES IMMUNE GLOBULIN 150 UNIT/ML IM INJ
20.0000 [IU]/kg | INJECTION | Freq: Once | INTRAMUSCULAR | Status: AC
  Administered 2023-04-07: 2250 [IU]

## 2023-04-07 MED ORDER — RABIES VACCINE, PCEC IM SUSR
1.0000 mL | Freq: Once | INTRAMUSCULAR | Status: AC
  Administered 2023-04-07: 1 mL via INTRAMUSCULAR

## 2023-04-07 NOTE — ED Triage Notes (Signed)
Patient reports that he woke up a week ago and saw live bats in his bedroom. Patient talked to  his PCP yesterday and states he was advised to start Rabies treatments. No wounds present.

## 2023-04-07 NOTE — Discharge Instructions (Addendum)
We have started you on the rabies vaccine series today due to your bat exposure. The effectiveness of the rabies vaccines may be compromised if the recommended vaccination schedule is not followed. EVERY EFFORT should be made to administer the vaccines in accordance with the recommended schedule. In general, if a rabies vaccine dose has been missed, the schedule should resume with the dose missed (i.e., If day 7 vaccine is missed (3rd dose), administer a vaccine today and call this vaccination the "day 7" (3rd dose) vaccination. The next vaccine (4th dose) would be given in 7 more days on "day 14", in keeping with the remainder of the 0, 3, 7, 14 day schedule.    In addition to the human rabies immune globulin (HRIG), you were given the first dose of the rabies vaccine today (Day 0).  Please return here on the following dates to complete the rabies series: Day 3: 04/10/2023 Day 7: 04/14/2023 Day 14: 04/21/2023   Return to clinic for any new or Urgent symptoms.

## 2023-04-07 NOTE — ED Provider Notes (Signed)
[facility]    CSN: [id number] Arrival date & time: 04/07/23  7425      History   Chief Complaint Chief Complaint  Patient presents with   bat exposure    HPI John Costa is a 57 y.o. male.   Patient presents to clinic after bat exposure. He has a cabin and him and his wife were staying there, when they woke up they became aware of a bat infestation. This happened last Friday, 7 days ago. No known bites. PCP advised coming to UC for rabies series.   Last TDaP was in 2020.     The history is provided by the patient and medical records.    Past Medical History:  Diagnosis Date   Allergy    Anal fissure    Anemia    History with gallbladder   Anxiety    Cataract    removed bilateral   Cerebral aneurysm    CHOLELITHIASIS 06/12/2007   Qualifier: Diagnosis of  By: Tawanna Cooler RN, Alvino Chapel     Depression    no treatment   GERD (gastroesophageal reflux disease)    occasional   Gilbert's syndrome    Gout    Headache(784.0)    from cerebral aneurysm  2008   HTN (hypertension)    Migraines    rare   Nephrolithiasis 09/19/2010   Pelvic kidney    Pneumonia    2010   PONV (postoperative nausea and vomiting)     Patient Active Problem List   Diagnosis Date Noted   Hyperlipidemia, unspecified 10/14/2022   Major depressive disorder with single episode, in full remission (HCC) 04/12/2022   Gangrenous cholecystitis s/p lap cholecystectomy 11/19/2017 11/18/2017   Hypertension 05/21/2017   History of adenomatous polyp of colon 12/25/2015   Panic attacks 11/26/2014   Allergic rhinitis 11/26/2014   Skin lesion 11/26/2014   Depression 03/14/2011   Aneurysm, carotid artery, internal 02/18/2011   Nephrolithiasis 09/19/2010   Pityriasis rosea 01/21/2008   Gout 06/12/2007   Gilbert's disease 06/12/2007    Past Surgical History:  Procedure Laterality Date   ARTERIAL ANEURYSM REPAIR  2008   CATARACT EXTRACTION     cataracts with lens implants   CHOLECYSTECTOMY  N/A 11/19/2017   Procedure: LAPAROSCOPIC CHOLECYSTECTOMY;  Surgeon: Glenna Fellows, MD;  Location: [facility];  Service: General;  Laterality: N/A;   CYSTOSCOPY WITH RETROGRADE PYELOGRAM, URETEROSCOPY AND STENT PLACEMENT Left 08/24/2015   Procedure: CYSTOSCOPY WITHLEFT RETROGRADE PYELOGRAM, URETEROSCOPY AND STENT PLACEMENT;  Surgeon: Heloise Purpura, MD;  Location: [facility];  Service: Urology;  Laterality: Left;   CYSTOSCOPY WITH RETROGRADE PYELOGRAM, URETEROSCOPY AND STENT PLACEMENT Left 11/05/2020   Procedure: CYSTOSCOPY WITH RETROGRADE PYELOGRAM, URETEROSCOPY AND STENT PLACEMENT;  Surgeon: Heloise Purpura, MD;  Location: [facility];  Service: Urology;  Laterality: Left;  75 MINS   EXTRACORPOREAL SHOCK WAVE LITHOTRIPSY Left 06/24/2021   Procedure: EXTRACORPOREAL SHOCK WAVE LITHOTRIPSY (ESWL);  Surgeon: Jerilee Field, MD;  Location: [facility];  Service: Urology;  Laterality: Left;   HOLMIUM LASER APPLICATION Left 08/24/2015   Procedure:  WITH HOLMIUM LASER LITHOTRIPSY;  Surgeon: Heloise Purpura, MD;  Location: [facility];  Service: Urology;  Laterality: Left;   HOLMIUM LASER APPLICATION Left 11/05/2020   Procedure: HOLMIUM LASER APPLICATION;  Surgeon: Heloise Purpura, MD;  Location: [facility];  Service: Urology;  Laterality: Left;   knee arthroscopies Bilateral    has had this several times   STONE EXTRACTION WITH BASKET  12/30/10   UPPER GASTROINTESTINAL ENDOSCOPY  Home Medications    Prior to Admission medications   Medication Sig Start Date End Date Taking? Authorizing Provider  acetaminophen (TYLENOL) 500 MG tablet Take 2 tablets (1,000 mg total) by mouth 3 (three) times daily. 11/21/17   Juliet Rude, PA-C  allopurinol (ZYLOPRIM) 100 MG tablet TAKE 1 TABLET BY MOUTH TWICE A DAY 04/06/23   Shelva Majestic, MD  amLODipine (NORVASC) 5 MG tablet TAKE 1 TABLET (5 MG TOTAL) BY MOUTH DAILY. 04/03/23   Shelva Majestic, MD  CALCIUM & MAGNESIUM CARBONATES PO Take 1 tablet by mouth daily.     [provider]  clonazePAM (KLONOPIN) 0.5 MG tablet Take 1 tablet (0.5 mg total) by mouth 2 (two) times daily as needed for anxiety. 10/02/20   Shelva Majestic, MD  fluticasone (FLONASE) 50 MCG/ACT nasal spray Place 2 sprays into both nostrils daily.    [provider]  L-THEANINE PO Take 1 tablet by mouth daily.    [provider]  metoprolol succinate (TOPROL-XL) 25 MG 24 hr tablet Take 1 tablet (25 mg total) by mouth daily. 09/20/22   Shelva Majestic, MD  Multiple Vitamins-Minerals (ONE-A-DAY 50 PLUS PO) Take 1 tablet by mouth.    [provider]  omeprazole (PRILOSEC) 20 MG capsule Take 20 mg by mouth daily.    [provider]  Potassium 99 MG TABS Take 99 mg by mouth daily.    [provider]  rosuvastatin (CRESTOR) 10 MG tablet Take 1 tablet (10 mg total) by mouth daily. 08/08/22   Jake Bathe, MD  terbinafine (LAMISIL) 1 % cream Apply 1 Application topically 2 (two) times daily. 03/29/23   Vivi Barrack, DPM  traMADol (ULTRAM) 50 MG tablet Take 1-2 tablets (50-100 mg total) by mouth every 6 (six) hours as needed for moderate pain or severe pain (from kidney stones or for headaches). 09/20/22   Shelva Majestic, MD    Family History Family History  Problem Relation Age of Onset   Prostate cancer Father 58   Diabetes Brother    Hemochromatosis Brother    Diabetes Sister    Hemachromatosis Sister    Colon cancer Neg Hx    Colon polyps Neg Hx     Social History Social History   Tobacco Use   Smoking status: Never   Smokeless tobacco: Never  Vaping Use   Vaping status: Never Used  Substance Use Topics   Alcohol use: Yes    Alcohol/week: 2.0 - 3.0 standard drinks of alcohol    Types: 2 - 3 Standard drinks or equivalent per week    Comment: occasional   Drug use: Not Currently    Types: Marijuana    Comment: 1990s, Has done it in legal states such as [place] since that time     Allergies    Cheese   Review of Systems Review of Systems   Physical Exam Triage Vital Signs ED Triage Vitals  Encounter Vitals Group     BP 04/07/23 0940 132/86     Systolic BP Percentile --      Diastolic BP Percentile --      Pulse Rate 04/07/23 0940 75     Resp 04/07/23 0940 16     Temp 04/07/23 0940 98.8 F (37.1 C)     Temp Source 04/07/23 0940 Oral     SpO2 04/07/23 0940 97 %     Weight 04/07/23 0954 248 lb (112.5 kg)     Height --  Head Circumference --      Peak Flow --      Pain Score 04/07/23 0942 0     Pain Loc --      Pain Education --      Exclude from Growth Chart --    No data found.  Updated Vital Signs BP 132/86 (BP Location: Left Arm)   Pulse 75   Temp 98.8 F (37.1 C) (Oral)   Resp 16   Wt 248 lb (112.5 kg)   SpO2 97%   BMI 35.58 kg/m   Visual Acuity Right Eye Distance:   Left Eye Distance:   Bilateral Distance:    Right Eye Near:   Left Eye Near:    Bilateral Near:     Physical Exam Vitals and nursing note reviewed.  Constitutional:      Appearance: Normal appearance.  HENT:     Head: Normocephalic and atraumatic.     Right Ear: External ear normal.     Left Ear: External ear normal.     Nose: Nose normal.     Mouth/Throat:     Mouth: Mucous membranes are moist.  Eyes:     Conjunctiva/sclera: Conjunctivae normal.  Cardiovascular:     Rate and Rhythm: Normal rate.  Pulmonary:     Effort: Pulmonary effort is normal. No respiratory distress.  Skin:    General: Skin is warm and dry.  Neurological:     General: No focal deficit present.     Mental Status: He is alert.  Psychiatric:        Mood and Affect: Mood normal.      UC Treatments / Results  Labs (all labs ordered are listed, but only abnormal results are displayed) Labs Reviewed - No data to display  EKG   Radiology No results found.  Procedures Procedures (including critical care time)  Medications Ordered in UC Medications  rabies immune globulin  (HYPERRAB/KEDRAB) injection 2,250 Units (has no administration in time range)  rabies vaccine (RABAVERT) injection 1 mL (has no administration in time range)    Initial Impression / Assessment and Plan / UC Course  I have reviewed the triage vital signs and the nursing notes.  Pertinent labs & imaging results that were available during my care of the patient were reviewed by me and considered in my medical decision making (see chart for details).  Vitals and triage reviewed, patient is hemodynamically stable. Started rabies series d/t bat exposure 7 days prior. Tdap is within the last 5 years. No known bite. Encouraged to follow vaccine schedule. POC, f/u care and return precautions given, no questions at this time.      Final Clinical Impressions(s) / UC Diagnoses   Final diagnoses:  Exposure to bat without known bite  Rabies, need for prophylactic vaccination against     Discharge Instructions      We have started you on the rabies vaccine series today due to your bat exposure. The effectiveness of the rabies vaccines may be compromised if the recommended vaccination schedule is not followed. EVERY EFFORT should be made to administer the vaccines in accordance with the recommended schedule. In general, if a rabies vaccine dose has been missed, the schedule should resume with the dose missed (i.e., If day 7 vaccine is missed (3rd dose), administer a vaccine today and call this vaccination the "day 7" (3rd dose) vaccination. The next vaccine (4th dose) would be given in 7 more days on "day 14", in keeping with the remainder  of the 0, 3, 7, 14 day schedule.    In addition to the human rabies immune globulin (HRIG), you were given the first dose of the rabies vaccine today (Day 0).  Please return here on the following dates to complete the rabies series: Day 3: 04/10/2023 Day 7: 04/14/2023 Day 14: 04/21/2023   Return to clinic for any new or Urgent symptoms.       ED Prescriptions    None    PDMP not reviewed this encounter.   Calyn Sivils, [place], [place] 04/07/23 1009

## 2023-04-10 ENCOUNTER — Encounter (HOSPITAL_COMMUNITY): Payer: Self-pay

## 2023-04-10 ENCOUNTER — Ambulatory Visit (HOSPITAL_COMMUNITY)
Admission: EM | Admit: 2023-04-10 | Discharge: 2023-04-10 | Disposition: A | Discharge status: Home / Self Care | Payer: BC Managed Care – PPO | Attending: Internal Medicine | Admitting: Internal Medicine

## 2023-04-10 VITALS — BP 122/77 | HR 67 | Temp 98.7°F | Resp 16 | Ht 71.0 in | Wt 247.0 lb

## 2023-04-10 DIAGNOSIS — Z203 Contact with and (suspected) exposure to rabies: Secondary | ICD-10-CM

## 2023-04-10 MED ORDER — RABIES VACCINE, PCEC IM SUSR: 1.0000 mL | Freq: Once | INTRAMUSCULAR | Status: DC

## 2023-04-10 MED ORDER — RABIES VACCINE, PCEC IM SUSR
INTRAMUSCULAR | Status: AC
  Filled 2023-04-10: qty 1

## 2023-04-10 MED ORDER — RABIES VACCINE, PCEC IM SUSR
1.0000 mL | Freq: Once | INTRAMUSCULAR | Status: AC
  Administered 2023-04-10: 1 mL via INTRAMUSCULAR

## 2023-04-10 NOTE — ED Triage Notes (Signed)
Patient here for day 3 Rabies vaccine

## 2023-04-14 ENCOUNTER — Encounter (HOSPITAL_COMMUNITY): Payer: Self-pay

## 2023-04-14 ENCOUNTER — Ambulatory Visit (HOSPITAL_COMMUNITY)
Admission: EM | Admit: 2023-04-14 | Discharge: 2023-04-14 | Disposition: A | Discharge status: Home / Self Care | Payer: BC Managed Care – PPO | Attending: Internal Medicine | Admitting: Internal Medicine

## 2023-04-14 VITALS — BP 129/77 | HR 74 | Temp 98.6°F | Resp 16 | Ht 71.0 in | Wt 246.9 lb

## 2023-04-14 DIAGNOSIS — Z203 Contact with and (suspected) exposure to rabies: Secondary | ICD-10-CM | POA: Diagnosis not present

## 2023-04-14 MED ORDER — RABIES VACCINE, PCEC IM SUSR
INTRAMUSCULAR | Status: AC
  Filled 2023-04-14: qty 1

## 2023-04-14 MED ORDER — RABIES VACCINE, PCEC IM SUSR
1.0000 mL | Freq: Once | INTRAMUSCULAR | Status: AC
  Administered 2023-04-14: 1 mL via INTRAMUSCULAR

## 2023-04-14 NOTE — ED Triage Notes (Signed)
Patient here for day 7 rabies vaccine. He will be going out of town and will not be able to come back for his day 14 rabies vaccine until 10 more days.

## 2023-04-24 ENCOUNTER — Telehealth: Payer: BC Managed Care – PPO | Admitting: Physician Assistant

## 2023-04-24 ENCOUNTER — Telehealth: Payer: Self-pay

## 2023-04-24 ENCOUNTER — Ambulatory Visit (HOSPITAL_COMMUNITY)
Admission: EM | Admit: 2023-04-24 | Discharge: 2023-04-24 | Disposition: A | Discharge status: Home / Self Care | Payer: BC Managed Care – PPO | Attending: Family Medicine | Admitting: Family Medicine

## 2023-04-24 ENCOUNTER — Telehealth: Payer: BC Managed Care – PPO

## 2023-04-24 DIAGNOSIS — U071 COVID-19: Secondary | ICD-10-CM

## 2023-04-24 DIAGNOSIS — Z23 Encounter for immunization: Secondary | ICD-10-CM | POA: Diagnosis not present

## 2023-04-24 DIAGNOSIS — Z203 Contact with and (suspected) exposure to rabies: Secondary | ICD-10-CM | POA: Diagnosis not present

## 2023-04-24 MED ORDER — NIRMATRELVIR/RITONAVIR (PAXLOVID)TABLET
3.0000 | ORAL_TABLET | Freq: Two times a day (BID) | ORAL | 0 refills | Status: AC
Start: 2023-04-24 — End: 2023-04-29

## 2023-04-24 MED ORDER — RABIES VACCINE, PCEC IM SUSR
INTRAMUSCULAR | Status: AC
  Filled 2023-04-24: qty 1

## 2023-04-24 MED ORDER — RABIES VACCINE, PCEC IM SUSR: 1.0000 mL | Freq: Once | INTRAMUSCULAR | Status: DC

## 2023-04-24 MED ORDER — RABIES VACCINE, PCEC IM SUSR
1.0000 mL | Freq: Once | INTRAMUSCULAR | Status: AC
  Administered 2023-04-24: 1 mL via INTRAMUSCULAR

## 2023-04-24 NOTE — Patient Instructions (Signed)
John Costa, thank you for joining [name], PA-C for today's virtual visit.  While this provider is not your primary care provider (PCP), if your PCP is located in our provider database this encounter information will be shared with them immediately following your visit.   A [facility] MyChart account gives you access to today's visit and all your visits, tests, and labs performed at [facility] " click here if you don't have a  MyChart account or go to [email or web address]  Consent: (Patient) John Costa provided verbal consent for this virtual visit at the beginning of the encounter.  Current Medications:  Current Outpatient Medications:    nirmatrelvir/ritonavir (PAXLOVID) 20 x 150 MG & 10 x 100MG  TABS, Take 3 tablets by mouth 2 (two) times daily for 5 days. (Take nirmatrelvir 150 mg two tablets twice daily for 5 days and ritonavir 100 mg one tablet twice daily for 5 days) Patient GFR is 90.9, Disp: 30 tablet, Rfl: 0   acetaminophen (TYLENOL) 500 MG tablet, Take 2 tablets (1,000 mg total) by mouth 3 (three) times daily., Disp: 30 tablet, Rfl: 0   allopurinol (ZYLOPRIM) 100 MG tablet, TAKE 1 TABLET BY MOUTH TWICE A DAY, Disp: 180 tablet, Rfl: 3   amLODipine (NORVASC) 5 MG tablet, TAKE 1 TABLET (5 MG TOTAL) BY MOUTH DAILY., Disp: 90 tablet, Rfl: 3   CALCIUM & MAGNESIUM CARBONATES PO, Take 1 tablet by mouth daily., Disp: , Rfl:    clonazePAM (KLONOPIN) 0.5 MG tablet, Take 1 tablet (0.5 mg total) by mouth 2 (two) times daily as needed for anxiety., Disp: 50 tablet, Rfl: 0   fluticasone (FLONASE) 50 MCG/ACT nasal spray, Place 2 sprays into both nostrils daily., Disp: , Rfl:    L-THEANINE PO, Take 1 tablet by mouth daily., Disp: , Rfl:    metoprolol succinate (TOPROL-XL) 25 MG 24 hr tablet, Take 1 tablet (25 mg total) by mouth daily., Disp: 90 tablet, Rfl: 3   Multiple Vitamins-Minerals (ONE-A-DAY 50 PLUS PO), Take 1 tablet by mouth., Disp: ,  Rfl:    omeprazole (PRILOSEC) 20 MG capsule, Take 20 mg by mouth daily., Disp: , Rfl:    Potassium 99 MG TABS, Take 99 mg by mouth daily., Disp: , Rfl:    rosuvastatin (CRESTOR) 10 MG tablet, Take 1 tablet (10 mg total) by mouth daily., Disp: 90 tablet, Rfl: 3   terbinafine (LAMISIL) 1 % cream, Apply 1 Application topically 2 (two) times daily., Disp: 30 g, Rfl: 0   traMADol (ULTRAM) 50 MG tablet, Take 1-2 tablets (50-100 mg total) by mouth every 6 (six) hours as needed for moderate pain or severe pain (from kidney stones or for headaches)., Disp: 30 tablet, Rfl: 1   Medications ordered in this encounter:  Meds ordered this encounter  Medications   nirmatrelvir/ritonavir (PAXLOVID) 20 x 150 MG & 10 x 100MG  TABS    Sig: Take 3 tablets by mouth 2 (two) times daily for 5 days. (Take nirmatrelvir 150 mg two tablets twice daily for 5 days and ritonavir 100 mg one tablet twice daily for 5 days) Patient GFR is 90.9    Dispense:  30 tablet    Refill:  0    Order Specific Question:   Supervising Provider    Answer:   Merrilee Jansky [id number]     *If you need refills on other medications prior to your next appointment, please contact your pharmacy*  Follow-Up: Call back or seek an in-person evaluation if the  symptoms worsen or if the condition fails to improve as anticipated.  [facility] [phone]  Care Instructions: Paxlovid (Nirmatrelvir; Ritonavir) Tablets What is this medication? NIRMATRELVIR; RITONAVIR (NIR ma TREL vir; ri TOE na veer) treats mild to moderate COVID-19. It may help people who are at high risk of developing severe illness. It works by limiting the spread of the virus in your body. This medicine may be used for other purposes; ask your health care provider or pharmacist if you have questions. COMMON BRAND NAME(S): PAXLOVID What should I tell my care team before I take this medication? They need to know if you have any of these conditions: Any  allergies Any serious illness Kidney disease Liver disease An unusual or allergic reaction to nirmatrelvir, ritonavir, other medications, foods, dyes, or preservatives Pregnant or trying to get pregnant Breast-feeding How should I use this medication? This product contains 2 different medications that are packaged together. For the standard dose, take 2 pink tablets of nirmatrelvir with 1 white tablet of ritonavir (3 tablets total) by mouth with water twice daily. Talk to your care team if you have kidney disease. You may need a different dose. Swallow the tablets whole. You can take it with or without food. If it upsets your stomach, take it with food. Take all of this medication unless your care team tells you to stop it early. Keep taking it even if you think you are better. Talk to your care team about the use of this medication in children. While it may be prescribed for children as young as 12 years for selected conditions, precautions do apply. Overdosage: If you think you have taken too much of this medicine contact a poison control center or emergency room at once. NOTE: This medicine is only for you. Do not share this medicine with others. What if I miss a dose? If you miss a dose, take it as soon as you can unless it is more than 8 hours late. If it is more than 8 hours late, skip the missed dose. Take the next dose at the normal time. Do not take extra or 2 doses at the same time to make up for the missed dose. What may interact with this medication? Do not take this medication with any of the following: Alfuzosin Certain medications for anxiety or sleep, such as midazolam or triazolam Certain medications for cancer, such as apalutamide Certain medications for cholesterol, such as lovastatin or simvastatin Certain medications for irregular heartbeat, such as amiodarone, dronedarone, flecainide, propafenone, quinidine Certain medications for mental health conditions, such as  lurasidone or pimozide Certain medications for seizures, such as carbamazepine, phenobarbital, phenytoin, primidone Colchicine Eletriptan Eplerenone Ergot alkaloids, such as dihydroergotamine, ergotamine, methylergonovine Finerenone Flibanserin Ivabradine Lomitapide Lumacaftor; ivacaftor Naloxegol Ranolazine Red Yeast Rice Rifampin Rifapentine Sildenafil Silodosin [facility] John's wort Tolvaptan Ubrogepant Voclosporin This medication may affect how other medications work, and other medications may affect the way this medication works. Talk with your care team about all of the medications you take. They may suggest changes to your treatment plan to lower the risk of side effects and to make sure your medications work as intended. This list may not describe all possible interactions. Give your health care provider a list of all the medicines, herbs, non-prescription drugs, or dietary supplements you use. Also tell them if you smoke, drink alcohol, or use illegal drugs. Some items may interact with your medicine. What should I watch for while using this medication? Your condition  will be monitored carefully while you are receiving this medication. Visit your care team for regular checkups. Tell your care team if your symptoms do not start to get better or if they get worse. If you have untreated HIV infection, this medication may lead to some HIV medications not working as well in the future. Estrogen and progestin hormones may not work as well while you are taking this medication. Your care team can help you find the contraceptive option that works for you. What side effects may I notice from receiving this medication? Side effects that you should report to your care team as soon as possible: Allergic reactions--skin rash, itching, hives, swelling of the face, lips, tongue, or throat Liver injury--right upper belly pain, loss of appetite, nausea, light-colored stool, dark yellow or brown  urine, yellowing skin or eyes, unusual weakness or fatigue Redness, blistering, peeling, or loosening of the skin, including inside the mouth Side effects that usually do not require medical attention (report these to your care team if they continue or are bothersome): Change in taste Diarrhea General discomfort and fatigue Increase in blood pressure Muscle pain Nausea Stomach pain This list may not describe all possible side effects. Call your doctor for medical advice about side effects. You may report side effects to FDA at 1-800-FDA-1088. Where should I keep my medication? Keep out of the reach of children and pets. Store at room temperature between 20 and 25 degrees C (68 and 77 degrees F). Get rid of any unused medication after the expiration date. To get rid of medications that are no longer needed or have expired: Take the medication to a medication take-back program. Check with your pharmacy or law enforcement to find a location. If you cannot return the medication, check the label or package insert to see if the medication should be thrown out in the garbage or flushed down the toilet. If you are not sure, ask your care team. If it is safe to put it in the trash, take the medication out of the container. Mix the medication with cat litter, dirt, coffee grounds, or other unwanted substance. Seal the mixture in a bag or container. Put it in the trash. NOTE: This sheet is a summary. It may not cover all possible information. If you have questions about this medicine, talk to your doctor, pharmacist, or health care provider.  2024 Elsevier/Gold Standard (2022-10-24 00:00:00)    Isolation Instructions: You are to isolate at home until you have been fever free for at least 24 hours without a fever-reducing medication, and symptoms have been steadily improving for 24 hours. At that time,  you can end isolation but need to mask for an additional 5 days.   If you must be around other  household members who do not have symptoms, you need to make sure that both you and the family members are masking consistently with a high-quality mask.  If you note any worsening of symptoms despite treatment, please seek an in-person evaluation ASAP. If you note any significant shortness of breath or any chest pain, please seek ER evaluation. Please do not delay care!   COVID-19: What to Do if You Are Sick If you test positive and are an older adult or someone who is at high risk of getting very sick from COVID-19, treatment may be available. Contact a healthcare provider right away after a positive test to determine if you are eligible, even if your symptoms are mild right now. You can also visit  a Test to Treat location and, if eligible, receive a prescription from a provider. Don't delay: Treatment must be started within the first few days to be effective. If you have a fever, cough, or other symptoms, you might have COVID-19. Most people have mild illness and are able to recover at home. If you are sick: Keep track of your symptoms. If you have an emergency warning sign (including trouble breathing), call 911. Steps to help prevent the spread of COVID-19 if you are sick If you are sick with COVID-19 or think you might have COVID-19, follow the steps below to care for yourself and to help protect other people in your home and community. Stay home except to get medical care Stay home. Most people with COVID-19 have mild illness and can recover at home without medical care. Do not leave your home, except to get medical care. Do not visit public areas and do not go to places where you are unable to wear a mask. Take care of yourself. Get rest and stay hydrated. Take over-the-counter medicines, such as acetaminophen, to help you feel better. Stay in touch with your doctor. Call before you get medical care. Be sure to get care if you have trouble breathing, or have any other emergency warning signs,  or if you think it is an emergency. Avoid public transportation, ride-sharing, or taxis if possible. Get tested If you have symptoms of COVID-19, get tested. While waiting for test results, stay away from others, including staying apart from those living in your household. Get tested as soon as possible after your symptoms start. Treatments may be available for people with COVID-19 who are at risk for becoming very sick. Don't delay: Treatment must be started early to be effective--some treatments must begin within 5 days of your first symptoms. Contact your healthcare provider right away if your test result is positive to determine if you are eligible. Self-tests are one of several options for testing for the virus that causes COVID-19 and may be more convenient than laboratory-based tests and point-of-care tests. Ask your healthcare provider or your local health department if you need help interpreting your test results. You can visit your state, tribal, local, and territorial health department's website to look for the latest local information on testing sites. Separate yourself from other people As much as possible, stay in a specific room and away from other people and pets in your home. If possible, you should use a separate bathroom. If you need to be around other people or animals in or outside of the home, wear a well-fitting mask. Tell your close contacts that they may have been exposed to COVID-19. An infected person can spread COVID-19 starting 48 hours (or 2 days) before the person has any symptoms or tests positive. By letting your close contacts know they may have been exposed to COVID-19, you are helping to protect everyone. See COVID-19 and Animals if you have questions about pets. If you are diagnosed with COVID-19, someone from the health department may call you. Answer the call to slow the spread. Monitor your symptoms Symptoms of COVID-19 include fever, cough, or other  symptoms. Follow care instructions from your healthcare provider and local health department. Your local health authorities may give instructions on checking your symptoms and reporting information. When to seek emergency medical attention Look for emergency warning signs* for COVID-19. If someone is showing any of these signs, seek emergency medical care immediately: Trouble breathing Persistent pain or pressure in the chest  New confusion Inability to wake or stay awake Pale, gray, or blue-colored skin, lips, or nail beds, depending on skin tone *This list is not all possible symptoms. Please call your medical provider for any other symptoms that are severe or concerning to you. Call 911 or call ahead to your local emergency facility: Notify the operator that you are seeking care for someone who has or may have COVID-19. Call ahead before visiting your doctor Call ahead. Many medical visits for routine care are being postponed or done by phone or telemedicine. If you have a medical appointment that cannot be postponed, call your doctor's office, and tell them you have or may have COVID-19. This will help the office protect themselves and other patients. If you are sick, wear a well-fitting mask You should wear a mask if you must be around other people or animals, including pets (even at home). Wear a mask with the best fit, protection, and comfort for you. You don't need to wear the mask if you are alone. If you can't put on a mask (because of trouble breathing, for example), cover your coughs and sneezes in some other way. Try to stay at least 6 feet away from other people. This will help protect the people around you. Masks should not be placed on young children under age 42 years, anyone who has trouble breathing, or anyone who is not able to remove the mask without help. Cover your coughs and sneezes Cover your mouth and nose with a tissue when you cough or sneeze. Throw away used tissues in  a lined trash can. Immediately wash your hands with soap and water for at least 20 seconds. If soap and water are not available, clean your hands with an alcohol-based hand sanitizer that contains at least 60% alcohol. Clean your hands often Wash your hands often with soap and water for at least 20 seconds. This is especially important after blowing your nose, coughing, or sneezing; going to the bathroom; and before eating or preparing food. Use hand sanitizer if soap and water are not available. Use an alcohol-based hand sanitizer with at least 60% alcohol, covering all surfaces of your hands and rubbing them together until they feel dry. Soap and water are the best option, especially if hands are visibly dirty. Avoid touching your eyes, nose, and mouth with unwashed hands. Handwashing Tips Avoid sharing personal household items Do not share dishes, drinking glasses, cups, eating utensils, towels, or bedding with other people in your home. Wash these items thoroughly after using them with soap and water or put in the dishwasher. Clean surfaces in your home regularly Clean and disinfect high-touch surfaces (for example, doorknobs, tables, handles, light switches, and countertops) in your "sick room" and bathroom. In shared spaces, you should clean and disinfect surfaces and items after each use by the person who is ill. If you are sick and cannot clean, a caregiver or other person should only clean and disinfect the area around you (such as your bedroom and bathroom) on an as needed basis. Your caregiver/other person should wait as long as possible (at least several hours) and wear a mask before entering, cleaning, and disinfecting shared spaces that you use. Clean and disinfect areas that may have blood, stool, or body fluids on them. Use household cleaners and disinfectants. Clean visible dirty surfaces with household cleaners containing soap or detergent. Then, use a household disinfectant. Use a  product from [employer] List N: Disinfectants for Coronavirus (COVID-19). Be sure to follow  the instructions on the label to ensure safe and effective use of the product. Many products recommend keeping the surface wet with a disinfectant for a certain period of time (look at "contact time" on the product label). You may also need to wear personal protective equipment, such as gloves, depending on the directions on the product label. Immediately after disinfecting, wash your hands with soap and water for 20 seconds. For completed guidance on cleaning and disinfecting your home, visit Complete Disinfection Guidance. Take steps to improve ventilation at home Improve ventilation (air flow) at home to help prevent from spreading COVID-19 to other people in your household. Clear out COVID-19 virus particles in the air by opening windows, using air filters, and turning on fans in your home. Use this interactive tool to learn how to improve air flow in your home. When you can be around others after being sick with COVID-19 Deciding when you can be around others is different for different situations. Find out when you can safely end home isolation. For any additional questions about your care, contact your healthcare provider or state or local health department. 12/08/2020 Content source: [facility] ([facility]), Division of Viral Diseases This information is not intended to replace advice given to you by your health care provider. Make sure you discuss any questions you have with your health care provider. Document Revised: 01/21/2021 Document Reviewed: 01/21/2021 Elsevier Patient Education  2022 [employer].     If you have been instructed to have an in-person evaluation today at a local Urgent Care facility, please use the link below. It will take you to a list of all of our available [facility], including address, phone number and hours of  operation. Please do not delay care.  [facility]  If you or a family member do not have a primary care provider, use the link below to schedule a visit and establish care. When you choose a [facility] primary care physician or advanced practice provider, you gain a long-term partner in health. Find a Primary Care Provider  Learn more about [facility]'s in-office and virtual care options:  - Get Care Now

## 2023-04-24 NOTE — Progress Notes (Signed)
Virtual Visit Consent   John Costa, you are scheduled for a virtual visit with a [facility] provider today. Just as with appointments in the office, your consent must be obtained to participate. Your consent will be active for this visit and any virtual visit you may have with one of our providers in the next 365 days. If you have a MyChart account, a copy of this consent can be sent to you electronically.  As this is a virtual visit, video technology does not allow for your provider to perform a traditional examination. This may limit your provider's ability to fully assess your condition. If your provider identifies any concerns that need to be evaluated in person or the need to arrange testing (such as labs, EKG, etc.), we will make arrangements to do so. Although advances in technology are sophisticated, we cannot ensure that it will always work on either your end or our end. If the connection with a video visit is poor, the visit may have to be switched to a telephone visit. With either a video or telephone visit, we are not always able to ensure that we have a secure connection.  By engaging in this virtual visit, you consent to the provision of healthcare and authorize for your insurance to be billed (if applicable) for the services provided during this visit. Depending on your insurance coverage, you may receive a charge related to this service.  I need to obtain your verbal consent now. Are you willing to proceed with your visit today? John Costa has provided verbal consent on 04/24/2023 for a virtual visit (video or telephone). [name], PA-C  Date: 04/24/2023 2:02 PM  Virtual Visit via Video Note   I, [name], connected with  John Costa  ([id number], 1965-10-18) on 04/24/23 at  2:00 PM EDT by a video-enabled telemedicine application and verified that I am speaking with the correct person using two identifiers.  Location: Patient: Virtual Visit  Location Patient: Home Provider: Virtual Visit Location Provider: Home Office   I discussed the limitations of evaluation and management by telemedicine and the availability of in person appointments. The patient expressed understanding and agreed to proceed.    History of Present Illness: John Costa is a 57 y.o. who identifies as a male who was assigned male at birth, and is being seen today for Covid 29.  HPI: URI  This is a new problem. Episode onset: Tested positive for Covid 19 yesterday; symptoms started yesterday as well. The maximum temperature recorded prior to his arrival was 103 - 104 F (102-103). The fever has been present for Less than 1 day. Associated symptoms include congestion, headaches, a plugged ear sensation, rhinorrhea, sinus pain and a sore throat. Pertinent negatives include no coughing, diarrhea, ear pain, nausea or vomiting. Associated symptoms comments: Chills, fatigue, myalgias. Treatments tried: ibuprofen and tylenol, allergy medications. The treatment provided no relief.    Has had Covid 19 last year and did take Paxlovid, did well with it.  Problems:  Patient Active Problem List   Diagnosis Date Noted   Hyperlipidemia, unspecified 10/14/2022   Major depressive disorder with single episode, in full remission (HCC) 04/12/2022   Gangrenous cholecystitis s/p lap cholecystectomy 11/19/2017 11/18/2017   Hypertension 05/21/2017   History of adenomatous polyp of colon 12/25/2015   Panic attacks 11/26/2014   Allergic rhinitis 11/26/2014   Skin lesion 11/26/2014   Depression 03/14/2011   Aneurysm, carotid artery, internal 02/18/2011   Nephrolithiasis 09/19/2010  Pityriasis rosea 01/21/2008   Gout 06/12/2007   Gilbert's disease 06/12/2007    Allergies:  Allergies  Allergen Reactions   Cheese Anaphylaxis   Medications:  Current Outpatient Medications:    nirmatrelvir/ritonavir (PAXLOVID) 20 x 150 MG & 10 x 100MG  TABS, Take 3 tablets by mouth 2 (two)  times daily for 5 days. (Take nirmatrelvir 150 mg two tablets twice daily for 5 days and ritonavir 100 mg one tablet twice daily for 5 days) Patient GFR is 90.9, Disp: 30 tablet, Rfl: 0   acetaminophen (TYLENOL) 500 MG tablet, Take 2 tablets (1,000 mg total) by mouth 3 (three) times daily., Disp: 30 tablet, Rfl: 0   allopurinol (ZYLOPRIM) 100 MG tablet, TAKE 1 TABLET BY MOUTH TWICE A DAY, Disp: 180 tablet, Rfl: 3   amLODipine (NORVASC) 5 MG tablet, TAKE 1 TABLET (5 MG TOTAL) BY MOUTH DAILY., Disp: 90 tablet, Rfl: 3   CALCIUM & MAGNESIUM CARBONATES PO, Take 1 tablet by mouth daily., Disp: , Rfl:    clonazePAM (KLONOPIN) 0.5 MG tablet, Take 1 tablet (0.5 mg total) by mouth 2 (two) times daily as needed for anxiety., Disp: 50 tablet, Rfl: 0   fluticasone (FLONASE) 50 MCG/ACT nasal spray, Place 2 sprays into both nostrils daily., Disp: , Rfl:    L-THEANINE PO, Take 1 tablet by mouth daily., Disp: , Rfl:    metoprolol succinate (TOPROL-XL) 25 MG 24 hr tablet, Take 1 tablet (25 mg total) by mouth daily., Disp: 90 tablet, Rfl: 3   Multiple Vitamins-Minerals (ONE-A-DAY 50 PLUS PO), Take 1 tablet by mouth., Disp: , Rfl:    omeprazole (PRILOSEC) 20 MG capsule, Take 20 mg by mouth daily., Disp: , Rfl:    Potassium 99 MG TABS, Take 99 mg by mouth daily., Disp: , Rfl:    rosuvastatin (CRESTOR) 10 MG tablet, Take 1 tablet (10 mg total) by mouth daily., Disp: 90 tablet, Rfl: 3   terbinafine (LAMISIL) 1 % cream, Apply 1 Application topically 2 (two) times daily., Disp: 30 g, Rfl: 0   traMADol (ULTRAM) 50 MG tablet, Take 1-2 tablets (50-100 mg total) by mouth every 6 (six) hours as needed for moderate pain or severe pain (from kidney stones or for headaches)., Disp: 30 tablet, Rfl: 1  Observations/Objective: Patient is well-developed, well-nourished in no acute distress.  Resting comfortably at home.  Head is normocephalic, atraumatic.  No labored breathing.  Speech is clear and coherent with logical content.   Patient is alert and oriented at baseline.    Assessment and Plan: 1. COVID-19 - nirmatrelvir/ritonavir (PAXLOVID) 20 x 150 MG & 10 x 100MG  TABS; Take 3 tablets by mouth 2 (two) times daily for 5 days. (Take nirmatrelvir 150 mg two tablets twice daily for 5 days and ritonavir 100 mg one tablet twice daily for 5 days) Patient GFR is 90.9  Dispense: 30 tablet; Refill: 0 - MyChart COVID-19 home monitoring program; Future  - Continue OTC symptomatic management of choice - Will send OTC vitamins and supplement information through AVS - Paxlovid prescribed - Patient enrolled in MyChart symptom monitoring - Push fluids - Rest as needed - Discussed return precautions and when to seek in-person evaluation, sent via AVS as well   Follow Up Instructions: I discussed the assessment and treatment plan with the patient. The patient was provided an opportunity to ask questions and all were answered. The patient agreed with the plan and demonstrated an understanding of the instructions.  A copy of instructions were sent to the patient  via MyChart unless otherwise noted below.    The patient was advised to call back or seek an in-person evaluation if the symptoms worsen or if the condition fails to improve as anticipated.  Time:  I spent 10 minutes with the patient via telehealth technology discussing the above problems/concerns.    [name], PA-C

## 2023-04-24 NOTE — Telephone Encounter (Signed)
Called patient in regard to COVID survey for symptoms of cough. No answer, will send mychart advice.

## 2023-05-01 ENCOUNTER — Telehealth: Payer: Self-pay

## 2023-05-01 NOTE — Telephone Encounter (Signed)
Called patient, left VM to call community line if he has any questions or concerns about COVID symptoms.

## 2023-06-12 ENCOUNTER — Encounter: Payer: Self-pay | Admitting: Podiatry

## 2023-07-02 ENCOUNTER — Other Ambulatory Visit: Payer: Self-pay | Admitting: Cardiology

## 2023-07-05 ENCOUNTER — Other Ambulatory Visit: Payer: Self-pay | Admitting: Family Medicine

## 2023-07-26 ENCOUNTER — Other Ambulatory Visit: Payer: Self-pay | Admitting: Podiatry

## 2023-07-27 ENCOUNTER — Other Ambulatory Visit: Payer: Self-pay | Admitting: Podiatry

## 2023-07-27 DIAGNOSIS — Z79899 Other long term (current) drug therapy: Secondary | ICD-10-CM

## 2023-07-28 ENCOUNTER — Other Ambulatory Visit: Payer: Self-pay | Admitting: Cardiology

## 2023-08-09 ENCOUNTER — Other Ambulatory Visit: Payer: Self-pay | Admitting: Cardiology

## 2023-08-20 ENCOUNTER — Encounter (HOSPITAL_BASED_OUTPATIENT_CLINIC_OR_DEPARTMENT_OTHER): Payer: Self-pay | Admitting: Emergency Medicine

## 2023-08-20 ENCOUNTER — Emergency Department (HOSPITAL_BASED_OUTPATIENT_CLINIC_OR_DEPARTMENT_OTHER): Payer: BC Managed Care – PPO

## 2023-08-20 ENCOUNTER — Emergency Department (HOSPITAL_BASED_OUTPATIENT_CLINIC_OR_DEPARTMENT_OTHER)
Admission: EM | Admit: 2023-08-20 | Discharge: 2023-08-20 | Disposition: A | Discharge status: Home / Self Care | Payer: BC Managed Care – PPO

## 2023-08-20 ENCOUNTER — Other Ambulatory Visit: Payer: Self-pay

## 2023-08-20 DIAGNOSIS — M79675 Pain in left toe(s): Secondary | ICD-10-CM | POA: Diagnosis present

## 2023-08-20 DIAGNOSIS — S90212A Contusion of left great toe with damage to nail, initial encounter: Secondary | ICD-10-CM | POA: Diagnosis not present

## 2023-08-20 DIAGNOSIS — S90219A Contusion of unspecified great toe with damage to nail, initial encounter: Secondary | ICD-10-CM

## 2023-08-20 DIAGNOSIS — W2209XA Striking against other stationary object, initial encounter: Secondary | ICD-10-CM | POA: Diagnosis not present

## 2023-08-20 DIAGNOSIS — S99922A Unspecified injury of left foot, initial encounter: Secondary | ICD-10-CM

## 2023-08-20 MED ORDER — OXYCODONE-ACETAMINOPHEN 5-325 MG PO TABS
1.0000 | ORAL_TABLET | Freq: Once | ORAL | Status: AC
  Administered 2023-08-20: 1 via ORAL
  Filled 2023-08-20: qty 1

## 2023-08-20 NOTE — ED Provider Notes (Signed)
[place] EMERGENCY DEPARTMENT AT [facility] Provider Note   CSN: [id number] Arrival date & time: 08/20/23  1825     History No chief complaint on file.   John Costa is a 57 y.o. male with history of SVT presents emerged from today for evaluation of left great toe pain.  Patient reports that shortly prior to arrival he was moving a piece of furniture and scraped across his left toenail and started to have some toenail bleeding and pain.  He reports his tetanus shot is up-to-date.  He denies any numbness or tingling.  Denies any other injury.  HPI     Home Medications Prior to Admission medications   Medication Sig Start Date End Date Taking? Authorizing Provider  acetaminophen (TYLENOL) 500 MG tablet Take 2 tablets (1,000 mg total) by mouth 3 (three) times daily. 11/21/17   Juliet Rude, PA-C  allopurinol (ZYLOPRIM) 100 MG tablet TAKE 1 TABLET BY MOUTH TWICE A DAY 04/06/23   Shelva Majestic, MD  amLODipine (NORVASC) 5 MG tablet TAKE 1 TABLET (5 MG TOTAL) BY MOUTH DAILY. 04/03/23   Shelva Majestic, MD  CALCIUM & MAGNESIUM CARBONATES PO Take 1 tablet by mouth daily.    [provider]  clonazePAM (KLONOPIN) 0.5 MG tablet Take 1 tablet (0.5 mg total) by mouth 2 (two) times daily as needed for anxiety. 10/02/20   Shelva Majestic, MD  fluconazole (DIFLUCAN) 150 MG tablet TAKE 1 TABLET BY MOUTH ONE TIME PER WEEK 07/27/23   Vivi Barrack, DPM  fluticasone (FLONASE) 50 MCG/ACT nasal spray Place 2 sprays into both nostrils daily.    [provider]  L-THEANINE PO Take 1 tablet by mouth daily.    [provider]  metoprolol succinate (TOPROL-XL) 25 MG 24 hr tablet TAKE 1 TABLET (25 MG TOTAL) BY MOUTH DAILY. 07/06/23   Shelva Majestic, MD  Multiple Vitamins-Minerals (ONE-A-DAY 50 PLUS PO) Take 1 tablet by mouth.    [provider]  omeprazole (PRILOSEC) 20 MG capsule Take 20 mg by mouth daily.    [provider]   Potassium 99 MG TABS Take 99 mg by mouth daily.    [provider]  rosuvastatin (CRESTOR) 10 MG tablet TAKE 1 TABLET BY MOUTH EVERY DAY 08/09/23   Jake Bathe, MD  terbinafine (LAMISIL) 1 % cream Apply 1 Application topically 2 (two) times daily. 03/29/23   Vivi Barrack, DPM  traMADol (ULTRAM) 50 MG tablet Take 1-2 tablets (50-100 mg total) by mouth every 6 (six) hours as needed for moderate pain or severe pain (from kidney stones or for headaches). 09/20/22   Shelva Majestic, MD      Allergies    Cheese    Review of Systems   Review of Systems  Musculoskeletal:        Reports toe pain    Physical Exam Updated Vital Signs BP (!) 147/76 (BP Location: Right Arm)   Pulse 76   Temp 98.2 F (36.8 C)   Resp 18   Wt 115.7 kg   SpO2 97%   BMI 35.57 kg/m  Physical Exam Vitals and nursing note reviewed.  Constitutional:      General: He is not in acute distress.    Appearance: He is not ill-appearing or toxic-appearing.  Pulmonary:     Effort: Pulmonary effort is normal. No respiratory distress.  Feet:     Comments: Subungual hematoma noted to the left great toenail.  Some dried  blood present at the distal tip.  Brisk cap refill present on all 5 toes.  Palpable DP and PT pulses.  Compartments are soft.  No significant swelling or erythema present.  Patient report sensations intact. Skin:    General: Skin is warm and dry.  Neurological:     Mental Status: He is alert.  Psychiatric:        Mood and Affect: Mood normal.     ED Results / Procedures / Treatments   Labs (all labs ordered are listed, but only abnormal results are displayed) Labs Reviewed - No data to display  EKG None  Radiology DG Foot Complete Left  Result Date: 08/20/2023 CLINICAL DATA:  Great toe injury while moving furniture. EXAM: LEFT FOOT - COMPLETE 3+ VIEW COMPARISON:  None Available. FINDINGS: Mild hallux valgus deformity with mild degenerative changes in the first  metatarsal-phalangeal joint. No evidence of acute fracture or dislocation. No focal bone lesion or bone destruction. Soft tissues are unremarkable. IMPRESSION: No acute bony abnormalities. Electronically Signed   By: Burman Nieves M.D.   On: 08/20/2023 19:08    Procedures Procedures   Medications Ordered in ED Medications - No data to display  ED Course/ Medical Decision Making/ A&P   Medical Decision Making Amount and/or Complexity of Data Reviewed Radiology: ordered.  Risk Prescription drug management.   57 y.o. male presents to the ER for evaluation of left toe pain. Differential diagnosis includes but is not limited to sprain, strain, fracture, dislocation, laceration, contusion. Vital signs mildly elevated BP, otherwise unremarkable. Physical exam as noted above.   He reports tetanus is up-to-date.  XR imaging shows no acute bony abnormalities.. Per radiologist's interpretation.    Patient does have some bleeding present at the distal tip of the toe underneath the nail.  Not currently bleeding.  Does have some subungual hematoma which is likely where the blood is coming from.  I discussed with my attending about potentially removing the toenail to look for any nailbed lacerations.  He does not think this is necessary and recommends following with podiatry.  The patient already has an established podiatrist. Recommended that he follow up with him as well.  We discussed Epsom salt soaks to keep the area clean and covered.  Postop shoe provided as well.  Patient given Percocet here, wife will be driving him home.  Recommended Tylenol ibuprofen at home for management.  Discussed again the need to follow-up with podiatry.  He is neurovascular tact distally.  Vital signs show mildly elevated blood pressure otherwise unremarkable.  Discussed with him and he may end up losing the nail.  He is stable for discharge home.  We discussed the results of the labs/imaging. The plan is wound care,  pain management, follow-up with podiatry. We discussed strict return precautions and red flag symptoms. The patient verbalized their understanding and agrees to the plan. The patient is stable and being discharged home in good condition.  Portions of this report may have been transcribed using voice recognition software. Every effort was made to ensure accuracy; however, inadvertent computerized transcription errors may be present.   I discussed this case with my attending physician who cosigned this note including patient's presenting symptoms, physical exam, and planned diagnostics and interventions. Attending physician stated agreement with plan or made changes to plan which were implemented.   Attending physician assessed patient at bedside.  Final Clinical Impression(s) / ED Diagnoses Final diagnoses:  Injury of toe on left foot, initial encounter  Subungual  hematoma of great toe    Rx / DC Orders ED Discharge Orders     None         Achille Rich, PA-C 08/20/23 2048    Coral Spikes, DO 08/20/23 2351

## 2023-08-20 NOTE — ED Notes (Signed)
Report given and verbally understood.

## 2023-08-20 NOTE — Discharge Instructions (Addendum)
You were seen in the ER today for evaluation of your toe injury.  Thankfully, your x-ray was normal.  I am going to place you in a flatfoot shoe which should hopefully help with some of your pain.  I recommend soaks in Epsom salt and warm water for cleaning.  Please make sure you follow-up with your podiatrist.  Additionally, you may lose the toenail.  For pain, he can take Tylenol or ibuprofen as needed.  If you have any concerns, new or worsening symptoms, please return to the nearest emerged part for evaluation.  Contact a doctor if: Your symptoms do not get better after a few days of treatment. You have redness, swelling, or pain in your foot or toes. You have trouble moving the injured foot. The swelling or pain does not get better with medicine. Get help right away if: You have very bad pain. Your foot or toes are numb. Your foot or toes turn very light (pale) or cold. You cannot move your foot or ankle.

## 2023-08-20 NOTE — ED Triage Notes (Signed)
Moving furniture , ran over left great toe. Happened about an hour ago and no blood thinners.

## 2023-08-23 ENCOUNTER — Other Ambulatory Visit: Payer: Self-pay | Admitting: Cardiology

## 2023-08-24 ENCOUNTER — Ambulatory Visit: Payer: BC Managed Care – PPO | Admitting: Podiatry

## 2023-08-24 DIAGNOSIS — S90221A Contusion of right lesser toe(s) with damage to nail, initial encounter: Secondary | ICD-10-CM

## 2023-08-24 NOTE — Patient Instructions (Signed)

## 2023-08-25 LAB — CBC WITH DIFFERENTIAL/PLATELET
Basophils Absolute: 0.1 10*3/uL (ref 0.0–0.2)
Basos: 1 %
EOS (ABSOLUTE): 0.2 10*3/uL (ref 0.0–0.4)
Eos: 2 %
Hematocrit: 42.6 % (ref 37.5–51.0)
Hemoglobin: 14.2 g/dL (ref 13.0–17.7)
Immature Grans (Abs): 0 10*3/uL (ref 0.0–0.1)
Immature Granulocytes: 0 %
Lymphocytes Absolute: 2.1 10*3/uL (ref 0.7–3.1)
Lymphs: 27 %
MCH: 28.3 pg (ref 26.6–33.0)
MCHC: 33.3 g/dL (ref 31.5–35.7)
MCV: 85 fL (ref 79–97)
Monocytes Absolute: 0.5 10*3/uL (ref 0.1–0.9)
Monocytes: 6 %
Neutrophils Absolute: 5.1 10*3/uL (ref 1.4–7.0)
Neutrophils: 64 %
Platelets: 244 10*3/uL (ref 150–450)
RBC: 5.02 x10E6/uL (ref 4.14–5.80)
RDW: 14.9 % (ref 11.6–15.4)
WBC: 7.9 10*3/uL (ref 3.4–10.8)

## 2023-08-25 LAB — HEPATIC FUNCTION PANEL
ALT: 31 [IU]/L (ref 0–44)
AST: 34 [IU]/L (ref 0–40)
Albumin: 4.4 g/dL (ref 3.8–4.9)
Alkaline Phosphatase: 70 [IU]/L (ref 44–121)
Bilirubin Total: 1.2 mg/dL (ref 0.0–1.2)
Bilirubin, Direct: 0.42 mg/dL — ABNORMAL HIGH (ref 0.00–0.40)
Total Protein: 7.5 g/dL (ref 6.0–8.5)

## 2023-08-25 NOTE — Telephone Encounter (Signed)
For review-pt has not scheduled f/u after multiple refill attempts. Thanks

## 2023-08-25 NOTE — Telephone Encounter (Signed)
Has not been seen in over 1 yr - please obtain from PCP

## 2023-08-28 ENCOUNTER — Other Ambulatory Visit: Payer: Self-pay | Admitting: Podiatry

## 2023-08-28 DIAGNOSIS — Z79899 Other long term (current) drug therapy: Secondary | ICD-10-CM

## 2023-08-28 NOTE — Progress Notes (Signed)
Subjective: Chief Complaint  Patient presents with   Toe Injury    RM#14 hospital follow up left big toe injury   57 year old male presents the office today with concerns of left big toenail injury which occurred on Sunday.  He was seen in the emergency room for this skin on and bleeding.  The nail did lift up originally.  No current bleeding.  The pain is improving as well.  No purulence.  Objective: AAO x3, NAD DP/PT pulses palpable bilaterally, CRT less than 3 seconds Left hallux toenail appears to be intact today and there is dried blood underneath the toenail.  There are some edema but there is no cellulitis present there is no purulence.  There is no obvious signs of infection.  Mild tenderness palpation of the toenail. No pain with calf compression, swelling, warmth, erythema  Assessment: Left hallux toenail injury, subungual hematoma  Plan: -All treatment options discussed with the patient including all alternatives, risks, complications.  -At this time since the injury is several days old we discussed nail removal versus conservative management.  After discussion initially agreed to hold off on nail removal. Continue Epsom salt soaks and monitor for any signs or symptoms of infection.  Since his symptoms are improving I expect this to continue to improve however should he have any signs of infection increased pain will need to proceed with total nail removal. -Patient encouraged to call the office with any questions, concerns, change in symptoms.   Vivi Barrack DPM

## 2023-08-31 ENCOUNTER — Ambulatory Visit: Payer: BC Managed Care – PPO | Admitting: Podiatry

## 2023-09-10 ENCOUNTER — Other Ambulatory Visit: Payer: Self-pay | Admitting: Cardiology

## 2023-09-22 ENCOUNTER — Telehealth: Payer: Self-pay | Admitting: Cardiology

## 2023-09-22 MED ORDER — ROSUVASTATIN CALCIUM 10 MG PO TABS: 10.0000 mg | ORAL_TABLET | Freq: Every day | ORAL | 1 refills | Status: DC

## 2023-09-22 NOTE — Telephone Encounter (Signed)
 *  STAT* If patient is at the pharmacy, call can be transferred to refill team.   1. Which medications need to be refilled? (please list name of each medication and dose if known)   rosuvastatin  (CRESTOR ) 10 MG tablet   2. Which pharmacy/location (including street and city if local pharmacy) is medication to be sent to?  [id number] - [place], [place] - [address]    3. Do they need a 30 day or 90 day supply?  90 day  Patient has appt on 10/31/23 with [facility]

## 2023-09-22 NOTE — Telephone Encounter (Signed)
 Pt's medication was sent to pt's pharmacy as requested. Confirmation received.

## 2023-10-05 ENCOUNTER — Other Ambulatory Visit: Payer: Self-pay | Admitting: Urology

## 2023-10-06 NOTE — Progress Notes (Signed)
Pt returned call. Instructions given. Arrival time 0600 may take Beta Blocker Monday morning with sip of water. Other than that NPO after MN. Bring blue folder in . Hx and Meds reviewed,. Wife is the driver

## 2023-10-09 ENCOUNTER — Ambulatory Visit (HOSPITAL_COMMUNITY): Payer: 59

## 2023-10-09 ENCOUNTER — Ambulatory Visit (HOSPITAL_BASED_OUTPATIENT_CLINIC_OR_DEPARTMENT_OTHER)
Admission: RE | Admit: 2023-10-09 | Discharge: 2023-10-09 | Disposition: A | Discharge status: Home / Self Care | Payer: 59 | Attending: Urology | Admitting: Urology

## 2023-10-09 ENCOUNTER — Other Ambulatory Visit: Payer: Self-pay

## 2023-10-09 ENCOUNTER — Encounter (HOSPITAL_BASED_OUTPATIENT_CLINIC_OR_DEPARTMENT_OTHER)
Admission: RE | Disposition: A | Discharge status: Home / Self Care | Payer: Self-pay | Source: Home / Self Care | Attending: Urology

## 2023-10-09 ENCOUNTER — Encounter (HOSPITAL_BASED_OUTPATIENT_CLINIC_OR_DEPARTMENT_OTHER): Payer: Self-pay | Admitting: Urology

## 2023-10-09 DIAGNOSIS — N200 Calculus of kidney: Secondary | ICD-10-CM | POA: Diagnosis present

## 2023-10-09 DIAGNOSIS — I10 Essential (primary) hypertension: Secondary | ICD-10-CM | POA: Insufficient documentation

## 2023-10-09 DIAGNOSIS — Q632 Ectopic kidney: Secondary | ICD-10-CM | POA: Insufficient documentation

## 2023-10-09 DIAGNOSIS — I499 Cardiac arrhythmia, unspecified: Secondary | ICD-10-CM | POA: Diagnosis not present

## 2023-10-09 HISTORY — PX: EXTRACORPOREAL SHOCK WAVE LITHOTRIPSY: SHX1557

## 2023-10-09 SURGERY — LITHOTRIPSY, ESWL
Anesthesia: LOCAL | Laterality: Left

## 2023-10-09 MED ORDER — DIAZEPAM 5 MG PO TABS
ORAL_TABLET | ORAL | Status: AC
Start: 2023-10-09 — End: ?
  Filled 2023-10-09: qty 2

## 2023-10-09 MED ORDER — SODIUM CHLORIDE 0.9 % IV SOLN: INTRAVENOUS | Status: DC

## 2023-10-09 MED ORDER — DIPHENHYDRAMINE HCL 25 MG PO CAPS
25.0000 mg | ORAL_CAPSULE | ORAL | Status: AC
  Administered 2023-10-09: 25 mg via ORAL

## 2023-10-09 MED ORDER — TAMSULOSIN HCL 0.4 MG PO CAPS: 0.4000 mg | ORAL_CAPSULE | Freq: Every day | ORAL | 0 refills | Status: DC

## 2023-10-09 MED ORDER — CIPROFLOXACIN HCL 500 MG PO TABS
ORAL_TABLET | ORAL | Status: AC
  Filled 2023-10-09: qty 1

## 2023-10-09 MED ORDER — CIPROFLOXACIN HCL 500 MG PO TABS
500.0000 mg | ORAL_TABLET | ORAL | Status: AC
  Administered 2023-10-09: 500 mg via ORAL

## 2023-10-09 MED ORDER — TRAMADOL HCL 50 MG PO TABS: 50.0000 mg | ORAL_TABLET | Freq: Four times a day (QID) | ORAL | 0 refills | Status: AC | PRN

## 2023-10-09 MED ORDER — DIPHENHYDRAMINE HCL 25 MG PO CAPS
ORAL_CAPSULE | ORAL | Status: AC
Start: 2023-10-09 — End: ?
  Filled 2023-10-09: qty 1

## 2023-10-09 MED ORDER — DIAZEPAM 5 MG PO TABS
10.0000 mg | ORAL_TABLET | ORAL | Status: AC
  Administered 2023-10-09: 10 mg via ORAL

## 2023-10-09 NOTE — Op Note (Signed)
See [employer] operative note scanned into chart. Also because of the size, density, location and other factors that cannot be anticipated I feel this will likely be a staged procedure. This fact supersedes any indication in the scanned [place] stone operative note to the contrary.  Irine Seal MD 10/09/2023, 8:37 AM  [facility]  Pager: [phone]

## 2023-10-09 NOTE — Discharge Instructions (Signed)
1. You should strain your urine and collect all fragments and bring them to your follow up appointment.  °2. You should take your pain medication as needed.  Please call if your pain is severe to the point that it is not controlled with your pain medication. °3. You should call if you develop fever > 101 or persistent nausea or vomiting. °4. Your doctor may prescribe tamsulosin to take to help facilitate stone passage. °

## 2023-10-09 NOTE — H&P (Signed)
H&P  History of Present Illness: MAY FAYARD is a 58 y.o. year old M who presents today for treatment of a left renal stone. Patient has a left pelvic kidney  No acute complaints  Past Medical History:  Diagnosis Date   Allergy    Anal fissure    Anemia    History with gallbladder   Anxiety    Cataract    removed bilateral   Cerebral aneurysm    CHOLELITHIASIS 06/12/2007   Qualifier: Diagnosis of  By: Tawanna Cooler RN, Alvino Chapel     Depression    no treatment   GERD (gastroesophageal reflux disease)    occasional   Gilbert's syndrome    Gout    Headache(784.0)    from cerebral aneurysm  2008   HTN (hypertension)    Migraines    rare   Nephrolithiasis 09/19/2010   Pelvic kidney    Pneumonia    2010   PONV (postoperative nausea and vomiting)     Past Surgical History:  Procedure Laterality Date   ARTERIAL ANEURYSM REPAIR  2008   CATARACT EXTRACTION     cataracts with lens implants   CHOLECYSTECTOMY N/A 11/19/2017   Procedure: LAPAROSCOPIC CHOLECYSTECTOMY;  Surgeon: Glenna Fellows, MD;  Location: [facility];  Service: General;  Laterality: N/A;   CYSTOSCOPY WITH RETROGRADE PYELOGRAM, URETEROSCOPY AND STENT PLACEMENT Left 08/24/2015   Procedure: CYSTOSCOPY WITHLEFT RETROGRADE PYELOGRAM, URETEROSCOPY AND STENT PLACEMENT;  Surgeon: Heloise Purpura, MD;  Location: [facility];  Service: Urology;  Laterality: Left;   CYSTOSCOPY WITH RETROGRADE PYELOGRAM, URETEROSCOPY AND STENT PLACEMENT Left 11/05/2020   Procedure: CYSTOSCOPY WITH RETROGRADE PYELOGRAM, URETEROSCOPY AND STENT PLACEMENT;  Surgeon: Heloise Purpura, MD;  Location: [facility];  Service: Urology;  Laterality: Left;  75 MINS   EXTRACORPOREAL SHOCK WAVE LITHOTRIPSY Left 06/24/2021   Procedure: EXTRACORPOREAL SHOCK WAVE LITHOTRIPSY (ESWL);  Surgeon: Jerilee Field, MD;  Location: [facility];  Service: Urology;  Laterality: Left;   HOLMIUM LASER APPLICATION Left 08/24/2015   Procedure:  WITH HOLMIUM LASER LITHOTRIPSY;   Surgeon: Heloise Purpura, MD;  Location: [facility];  Service: Urology;  Laterality: Left;   HOLMIUM LASER APPLICATION Left 11/05/2020   Procedure: HOLMIUM LASER APPLICATION;  Surgeon: Heloise Purpura, MD;  Location: [facility];  Service: Urology;  Laterality: Left;   knee arthroscopies Bilateral    has had this several times   STONE EXTRACTION WITH BASKET  12/30/10   UPPER GASTROINTESTINAL ENDOSCOPY      Home Medications:  Current Meds  Medication Sig   acetaminophen (TYLENOL) 500 MG tablet Take 2 tablets (1,000 mg total) by mouth 3 (three) times daily.   allopurinol (ZYLOPRIM) 100 MG tablet TAKE 1 TABLET BY MOUTH TWICE A DAY   amLODipine (NORVASC) 5 MG tablet TAKE 1 TABLET (5 MG TOTAL) BY MOUTH DAILY.   CALCIUM & MAGNESIUM CARBONATES PO Take 1 tablet by mouth daily.   clonazePAM (KLONOPIN) 0.5 MG tablet Take 1 tablet (0.5 mg total) by mouth 2 (two) times daily as needed for anxiety.   fluticasone (FLONASE) 50 MCG/ACT nasal spray Place 2 sprays into both nostrils daily.   L-THEANINE PO Take 1 tablet by mouth daily.   metoprolol succinate (TOPROL-XL) 25 MG 24 hr tablet TAKE 1 TABLET (25 MG TOTAL) BY MOUTH DAILY.   Multiple Vitamins-Minerals (ONE-A-DAY 50 PLUS PO) Take 1 tablet by mouth.   omeprazole (PRILOSEC) 20 MG capsule Take 20 mg by mouth daily.   rosuvastatin (CRESTOR) 10 MG tablet Take 1 tablet (10 mg  total) by mouth daily.   terbinafine (LAMISIL) 1 % cream Apply 1 Application topically 2 (two) times daily.   traMADol (ULTRAM) 50 MG tablet Take 1-2 tablets (50-100 mg total) by mouth every 6 (six) hours as needed for moderate pain or severe pain (from kidney stones or for headaches).    Allergies:  Allergies  Allergen Reactions   Cheese Anaphylaxis    Family History  Problem Relation Age of Onset   Prostate cancer Father 34   Diabetes Brother    Hemochromatosis Brother    Diabetes Sister    Hemachromatosis Sister    Colon cancer Neg Hx    Colon polyps Neg Hx     Social History:   reports that he has never smoked. He has never used smokeless tobacco. He reports current alcohol use of about 2.0 - 3.0 standard drinks of alcohol per week. He reports that he does not currently use drugs after having used the following drugs: Marijuana.  ROS: A complete review of systems was performed.  All systems are negative except for pertinent findings as noted.  Physical Exam:  Vital signs in last 24 hours: Temp:  [98.1 F (36.7 C)] 98.1 F (36.7 C) (01/20 0646) Pulse Rate:  [82] 82 (01/20 0646) Resp:  [17] 17 (01/20 0646) BP: (144)/(87) 144/87 (01/20 0646) SpO2:  [99 %] 99 % (01/20 0646) Weight:  [119.3 kg] 119.3 kg (01/20 0646) Constitutional:  Alert and oriented, No acute distress Cardiovascular: Regular rate and rhythm Respiratory: Normal respiratory effort, Lungs clear bilaterally GI: Abdomen is soft, nontender, nondistended, no abdominal masses Lymphatic: No lymphadenopathy Neurologic: Grossly intact, no focal deficits Psychiatric: Normal mood and affect   Laboratory Data:  No results for input(s): "WBC", "HGB", "HCT", "PLT" in the last 72 hours.  No results for input(s): "NA", "K", "CL", "GLUCOSE", "BUN", "CALCIUM", "CREATININE" in the last 72 hours.  Invalid input(s): "CO3"   No results found for this or any previous visit (from the past 24 hours). No results found for this or any previous visit (from the past 240 hours).  Renal Function: No results for input(s): "CREATININE" in the last 168 hours. CrCl cannot be calculated (Patient's most recent lab result is older than the maximum 21 days allowed.).  Radiologic Imaging: No results found.  Assessment:  PERSHING BONNICI is a 58 y.o. year old M with left renal stone in left renal pelvic kidney  Plan:  --to OR as planned for L ESWL. Procedure and risks reviewed, including but not limited to hematuria, infection, sepsis, damage to GU tract, failure to complete procedure, retained stone fragments, need for  future procedures, pain.   Irine Seal, MD 10/09/2023, 7:43 AM  [facility] Pager: [phone]

## 2023-10-10 ENCOUNTER — Encounter (HOSPITAL_BASED_OUTPATIENT_CLINIC_OR_DEPARTMENT_OTHER): Payer: Self-pay | Admitting: Urology

## 2023-10-16 ENCOUNTER — Encounter: Payer: Self-pay | Admitting: Family Medicine

## 2023-10-31 ENCOUNTER — Ambulatory Visit: Payer: 59 | Attending: Cardiology | Admitting: Cardiology

## 2023-10-31 ENCOUNTER — Encounter: Payer: Self-pay | Admitting: Cardiology

## 2023-10-31 VITALS — BP 124/88 | HR 88 | Ht 70.0 in | Wt 267.4 lb

## 2023-10-31 DIAGNOSIS — I10 Essential (primary) hypertension: Secondary | ICD-10-CM

## 2023-10-31 DIAGNOSIS — I671 Cerebral aneurysm, nonruptured: Secondary | ICD-10-CM

## 2023-10-31 DIAGNOSIS — I2583 Coronary atherosclerosis due to lipid rich plaque: Secondary | ICD-10-CM | POA: Diagnosis not present

## 2023-10-31 DIAGNOSIS — I2510 Atherosclerotic heart disease of native coronary artery without angina pectoris: Secondary | ICD-10-CM

## 2023-10-31 MED ORDER — METOPROLOL SUCCINATE ER 25 MG PO TB24: 25.0000 mg | ORAL_TABLET | Freq: Every day | ORAL | 3 refills | Status: DC | PRN

## 2023-10-31 MED ORDER — ASPIRIN 81 MG PO TBEC
81.0000 mg | DELAYED_RELEASE_TABLET | Freq: Every day | ORAL | Status: AC
Start: 2023-10-31 — End: ?

## 2023-10-31 MED ORDER — ROSUVASTATIN CALCIUM 10 MG PO TABS: 10.0000 mg | ORAL_TABLET | Freq: Every day | ORAL | 3 refills | Status: DC

## 2023-10-31 NOTE — Progress Notes (Signed)
Cardiology Office Note:  .   Date:  10/31/2023  ID:  John Costa, DOB 1966-01-20, MRN [id number] PCP: Shelva Majestic, MD  [facility] Providers Cardiologist:  None     History of Present Illness: John Costa   John Costa is a 58 y.o. male Discussed with the use of AI scribe  History of Present Illness   John Costa is a 58 year old male with coronary artery disease who presents for follow-up.  He underwent a coronary CT scan on July 29, 2022, which showed a coronary calcium score of six, placing him in the fiftieth percentile. The scan revealed mild RCA stenosis, minimal LAD stenosis, and mild mid LAD stenosis, indicating nonobstructive coronary artery disease.  On August 09, 2022, he had a Xeo monitor which showed sinus rhythm with an average heart rate of 90 beats per minute, brief episodes of atrial tachycardia, rare PACs, rare PVCs, and no evidence of atrial fibrillation. No chest pain, palpitations, or dizziness reported during these activities.  He experiences significant fatigue and shortness of breath after six minutes of rowing exercise. He becomes out of breath when increasing his pace on the elliptical.  He is currently on Crestor 10 mg daily with no reported side effects. Recent blood work shows an LDL of 61, hemoglobin of 14.9, creatinine of 0.94, ALT of 31, and TSH of 1.5, all within normal limits. He is also taking metoprolol 25 mg, which he believes may be affecting his exercise tolerance.  He is a Clinical research associate in [employer] and engages in regular physical activity, including rowing twice a week, walking his dog, and using the elliptical at the gym. Despite this, he has experienced a weight increase from 205 to 267 pounds over the past two years.  He has a history of a carotid artery aneurysm treated in 2008 at the [facility] and possible fibromuscular dysplasia.           Studies Reviewed: John Costa   EKG Interpretation Date/Time:  Tuesday October 31 2023 08:46:53 EST Ventricular Rate:  81 PR Interval:  148 QRS Duration:  82 QT Interval:  376 QTC Calculation: 436 R Axis:   29  Text Interpretation: Normal sinus rhythm Normal ECG When compared with ECG of 30-Oct-2020 19:00, No significant change since last tracing Confirmed by Donato Schultz ([id number]) on 10/31/2023 8:48:12 AM    Results   LABS LDL: 61 (10/10/2022) Hb: 14.9 (10/25/2022) Cr: 0.94 (10/25/2022) ALT: 31 (10/25/2022) TSH: 1.5 (10/25/2022)  RADIOLOGY Coronary CT scan: Coronary calcium score of 6, mild RCA stenosis, minimal LAD stenosis, mild mid LAD stenosis, nonobstructive CAD (07/29/2022)  DIAGNOSTIC Xeo monitor: Sinus rhythm, average heart rate 90 bpm, brief episodes of atrial tachycardia, rare PACs, rare PVCs, no atrial fibrillation (08/09/2022) Echocardiogram: Normal left ventricular ejection fraction (05/06/2022)     Risk Assessment/Calculations:            Physical Exam:   VS:  BP 124/88   Pulse 88   Ht 5\' 10"  (1.778 m)   Wt 267 lb 6.4 oz (121.3 kg)   SpO2 95%   BMI 38.37 kg/m    Wt Readings from Last 3 Encounters:  10/31/23 267 lb 6.4 oz (121.3 kg)  10/09/23 263 lb (119.3 kg)  08/20/23 255 lb (115.7 kg)    GEN: Well nourished, well developed in no acute distress NECK: No JVD; No carotid bruits CARDIAC: RRR, no murmurs, no rubs, no gallops RESPIRATORY:  Clear to auscultation without rales, wheezing or rhonchi  ABDOMEN: Soft, non-tender, non-distended EXTREMITIES:  No edema; No deformity   ASSESSMENT AND PLAN: .    Assessment and Plan    Coronary Artery Disease (CAD) Nonobstructive CAD with coronary calcium score of 6 (50th percentile), mild RCA stenosis, minimal LAD stenosis, and mild mid LAD stenosis. On Crestor 10 mg daily with LDL at goal (61 mg/dL). Discussed trial of low-dose aspirin (81 mg) despite gout history; patient willing to trial. - Continue Crestor 10 mg daily - Initiate low-dose aspirin 81 mg daily - Monitor for gout  flares  Atrial Tachycardia Xio monitor showed sinus rhythm with brief benign atrial tachycardia, rare PACs, and rare PVCs. No atrial fibrillation detected. - No treatment required  Exercise Intolerance Significant fatigue and dyspnea after 6 minutes of rowing. Considering trial off metoprolol 25 mg daily to assess impact on exercise tolerance. Safe for any exercise based on recent testing. Discussed potential improvement in symptoms post-discontinuation. - Discontinue metoprolol 25 mg daily or use PRN - Monitor exercise tolerance and symptoms - Consider HIIT and weight lifting under trainer supervision  General Health Maintenance Recent labs: LDL 61 mg/dL, hemoglobin 16.1 g/dL, creatinine 0.96 mg/dL, ALT 31 U/L, TSH 1.5 mIU/L--all within normal limits. Blood pressure well-controlled. Discussed GLP-1 medications for weight loss, noting effectiveness and cost/insurance limitations. - Continue regular follow-up with primary care physician - Consider GLP-1 medications for weight loss if approved   Follow-up - Cardiology follow-up if needed      Graduation instructions: -Continue with secondary prevention for minimal/mild coronary artery disease - LDL goal less than 70 achieved with Crestor 10 mg - Consider low-dose aspirin 81 mg.  Of course if gouty flare occurs from aspirin he can avoid. - Continue with excellent exercise efforts -Since he is on low-dose metoprolol, he may trial coming off of this and see how his exercise efforts are.  No taper is necessary. Please make sure that all refills are directed towards Dr. Durene Cal in the future.  Please let us know we can be of further assistance.        Signed, Donato Schultz, MD

## 2023-10-31 NOTE — Patient Instructions (Signed)
Medication Instructions:  Please take Asprin 81 mg a day if tolerated. May discontinue taking Metoprolol daily and use as needed once daily. Continue all other medications as listed.  *If you need a refill on your cardiac medications before your next appointment, please call your pharmacy*   Follow-Up: At [facility], you and your health needs are our priority.  As part of our continuing mission to provide you with exceptional heart care, we have created designated Provider Care Teams.  These Care Teams include your primary Cardiologist (physician) and Advanced Practice Providers (APPs -  Physician Assistants and Nurse Practitioners) who all work together to provide you with the care you need, when you need it.  We recommend signing up for the patient portal called "MyChart".  Sign up information is provided on this After Visit Summary.  MyChart is used to connect with patients for Virtual Visits (Telemedicine).  Patients are able to view lab/test results, encounter notes, upcoming appointments, etc.  Non-urgent messages can be sent to your provider as well.   To learn more about what you can do with MyChart, go to [email or web address].    Your next appointment:   Follow up as needed.

## 2023-11-30 ENCOUNTER — Ambulatory Visit: Admitting: Family Medicine

## 2023-11-30 ENCOUNTER — Encounter: Payer: Self-pay | Admitting: Family Medicine

## 2023-11-30 DIAGNOSIS — Z1211 Encounter for screening for malignant neoplasm of colon: Secondary | ICD-10-CM

## 2023-11-30 DIAGNOSIS — N401 Enlarged prostate with lower urinary tract symptoms: Secondary | ICD-10-CM | POA: Diagnosis not present

## 2023-11-30 DIAGNOSIS — M1009 Idiopathic gout, multiple sites: Secondary | ICD-10-CM | POA: Diagnosis not present

## 2023-11-30 DIAGNOSIS — N400 Enlarged prostate without lower urinary tract symptoms: Secondary | ICD-10-CM | POA: Insufficient documentation

## 2023-11-30 DIAGNOSIS — R351 Nocturia: Secondary | ICD-10-CM

## 2023-11-30 DIAGNOSIS — E785 Hyperlipidemia, unspecified: Secondary | ICD-10-CM

## 2023-11-30 DIAGNOSIS — I10 Essential (primary) hypertension: Secondary | ICD-10-CM

## 2023-11-30 MED ORDER — TAMSULOSIN HCL 0.4 MG PO CAPS: 0.4000 mg | ORAL_CAPSULE | Freq: Every day | ORAL | 3 refills | Status: AC

## 2023-11-30 MED ORDER — ZEPBOUND 2.5 MG/0.5ML ~~LOC~~ SOAJ: 2.5000 mg | SUBCUTANEOUS | 2 refills | Status: AC

## 2023-11-30 MED ORDER — TRAMADOL HCL 50 MG PO TABS: 50.0000 mg | ORAL_TABLET | Freq: Four times a day (QID) | ORAL | 1 refills | Status: AC | PRN

## 2023-11-30 MED ORDER — CLONAZEPAM 0.5 MG PO TABS: 0.5000 mg | ORAL_TABLET | Freq: Two times a day (BID) | ORAL | 0 refills | Status: AC | PRN

## 2023-11-30 NOTE — Progress Notes (Signed)
 Phone [phone] In person visit   Subjective:   John Costa is a 58 y.o. year old very pleasant male patient who presents for/with See problem oriented charting Chief Complaint  Patient presents with   GLP1    Wants to discuss options    Past Medical History-  Patient Active Problem List   Diagnosis Date Noted   Aneurysm, carotid artery, internal 02/18/2011    Priority: High   BPH (benign prostatic hyperplasia) 11/30/2023    Priority: Medium    Hyperlipidemia, unspecified 10/14/2022    Priority: Medium    Hypertension 05/21/2017    Priority: Medium    History of adenomatous polyp of colon 12/25/2015    Priority: Medium    Panic attacks 11/26/2014    Priority: Medium    Depression 03/14/2011    Priority: Medium    Gout 06/12/2007    Priority: Medium    Gangrenous cholecystitis s/p lap cholecystectomy 11/19/2017 11/18/2017    Priority: Low   Allergic rhinitis 11/26/2014    Priority: Low   Skin lesion 11/26/2014    Priority: Low   Nephrolithiasis 09/19/2010    Priority: Low   Pityriasis rosea 01/21/2008    Priority: Low   Gilbert's disease 06/12/2007    Priority: Low   Major depressive disorder with single episode, in full remission (HCC) 04/12/2022    Medications- reviewed and updated Current Outpatient Medications  Medication Sig Dispense Refill   allopurinol (ZYLOPRIM) 100 MG tablet TAKE 1 TABLET BY MOUTH TWICE A DAY 180 tablet 3   amLODipine (NORVASC) 5 MG tablet TAKE 1 TABLET (5 MG TOTAL) BY MOUTH DAILY. 90 tablet 3   aspirin EC 81 MG tablet Take 1 tablet (81 mg total) by mouth daily. Swallow whole.     CALCIUM & MAGNESIUM CARBONATES PO Take 1 tablet by mouth daily.     Multiple Vitamins-Minerals (ONE-A-DAY 50 PLUS PO) Take 1 tablet by mouth.     omeprazole (PRILOSEC) 20 MG capsule Take 20 mg by mouth daily.     Potassium 99 MG TABS Take 99 mg by mouth daily.     rosuvastatin (CRESTOR) 10 MG tablet Take 1 tablet (10 mg total) by mouth daily. 90  tablet 3   terbinafine (LAMISIL) 1 % cream Apply 1 Application topically 2 (two) times daily. 30 g 0   tirzepatide (ZEPBOUND) 2.5 MG/0.5ML Pen Inject 2.5 mg into the skin once a week. 2 mL 2   traMADol (ULTRAM) 50 MG tablet Take 1 tablet (50 mg total) by mouth every 6 (six) hours as needed for moderate pain (pain score 4-6) or severe pain (pain score 7-10). 30 tablet 1   acetaminophen (TYLENOL) 500 MG tablet Take 2 tablets (1,000 mg total) by mouth 3 (three) times daily. (Patient not taking: Reported on 11/30/2023) 30 tablet 0   clonazePAM (KLONOPIN) 0.5 MG tablet Take 1 tablet (0.5 mg total) by mouth 2 (two) times daily as needed for anxiety. 20 tablet 0   fluticasone (FLONASE) 50 MCG/ACT nasal spray Place 2 sprays into both nostrils daily.     L-THEANINE PO Take 1 tablet by mouth daily.     metoprolol succinate (TOPROL-XL) 25 MG 24 hr tablet Take 1 tablet (25 mg total) by mouth daily as needed. (Patient not taking: Reported on 11/30/2023) 30 tablet 3   tamsulosin (FLOMAX) 0.4 MG CAPS capsule Take 1 capsule (0.4 mg total) by mouth daily after supper. 90 capsule 3   No current facility-administered medications for this visit.  Objective:  BP 124/82   Pulse 79   Temp (!) 97.4 F (36.3 C)   Ht 5\' 10"  (1.778 m)   Wt 250 lb 12.8 oz (113.8 kg)   SpO2 97%   BMI 35.99 kg/m  Gen: NAD, resting comfortably CV: RRR no murmurs rubs or gallops Lungs: CTAB no crackles, wheeze, rhonchi Ext: no edema Skin: warm, dry     Assessment and Plan   # Obesity-morbid with BMI over 35 and hypertension and hyperlipidemia S: Patient recently went to cardiology and noted weight up to 267 he became very inspired to make some changes -he has started HIIT workouts a month ago -also has been doing compounding GLP-1 agonist and has lost 17 lbs from peak with this along with HIIT A/P: Obesity improving with compounded GLP-1-I told patient I cannot formally recommend this but we could look at pursuing Zepbound or  Wegovy and he is interested - I think he is a great candidate for this with his coronary artery disease, hypertension and morbid obesity status with BMI over 35 with hypertension, hyperlipidemia, CAD -Encouraged need for healthy eating, regular exercise, weight loss.   # Carotid aneurysm history-has been released by cardiology for HIT but likely needs to avoid heavy lifting   #hypertension S: medication: Amlodipine 5 mg, metoprolol 25 mg extended release only as needed  A/P: Blood pressure well-controlled on amlodipine alone now-needing metoprolol last with weight loss but has available if blood pressure goes up or heart rate  #hyperlipidemia-follows with Dr. Anne Fu # Nonobstructive coronary artery calcium score of 6 which is 50th percentile for age S: Medication:Rosuvastatin 10 mg daily, aspirin 81 mg -Cardiology has also noted atrial tachycardia benign and rare PVCs and PACs on monitor-has metoprolol available as needed -No chest pain or shortness of breath reported Lab Results  Component Value Date   CHOL 136 10/10/2022   HDL 53 10/10/2022   LDLCALC 61 10/10/2022   LDLDIRECT 144.1 07/22/2013   TRIG 124 10/10/2022   CHOLHDL 2.6 10/10/2022  A/P: Lipids are well-controlled with LDL under 70 but needs updated lipid panel-he agrees to schedule physical in several months -Nonobstructive CAD asymptomatic-continue current medication   #Gout S: Medication: Allopurinol 200 mg but takes 100 mg twice daily-discussed could take altogether A/P: Appears well-controlled with no recent flares-update at next visit uric acid level   # History of kidney stones-on potassium 99 through urology  -We have sent in tramadol as needed in the past but also helps with headaches with prior carotid aneurysm/dissection issues which were treated-when they occur and he prefers this over NSAIDs with his hypertension-refilled today  # BPH S: Noted significant improvement in urinary symptoms on Flomax started for  kidney stones - stronger urinary stream. Nocturia from 3-4 x a night down to 1  A/P: Significant improvement in nocturia-refill Flomax on an ongoing basis   # Anxiety-as needed clonazepam in the past-very sparing use but request refill today  # Incidental finding of symmetric distal esophageal wall thickening- referredt o gastroenterology in the past-referred back today and encouraged him to discuss EGD with them   Recommended follow up: Return in about 4 months (around 03/31/2024) for physical or sooner if needed.Schedule b4 you leave.  Lab/Order associations:   ICD-10-CM   1. Morbid obesity (HCC)  E66.01     2. Screen for colon cancer  Z12.11 Ambulatory referral to Gastroenterology    CANCELED: Ambulatory referral to Gastroenterology    3. Benign prostatic hyperplasia with nocturia  N40.1  R35.1     4. Idiopathic gout of multiple sites, unspecified chronicity  M10.09     5. Primary hypertension  I10     6. Hyperlipidemia, unspecified hyperlipidemia type  E78.5       Meds ordered this encounter  Medications   clonazePAM (KLONOPIN) 0.5 MG tablet    Sig: Take 1 tablet (0.5 mg total) by mouth 2 (two) times daily as needed for anxiety.    Dispense:  20 tablet    Refill:  0    Not to exceed 5 additional fills before 07/01/2019   tirzepatide (ZEPBOUND) 2.5 MG/0.5ML Pen    Sig: Inject 2.5 mg into the skin once a week.    Dispense:  2 mL    Refill:  2   traMADol (ULTRAM) 50 MG tablet    Sig: Take 1 tablet (50 mg total) by mouth every 6 (six) hours as needed for moderate pain (pain score 4-6) or severe pain (pain score 7-10).    Dispense:  30 tablet    Refill:  1   tamsulosin (FLOMAX) 0.4 MG CAPS capsule    Sig: Take 1 capsule (0.4 mg total) by mouth daily after supper.    Dispense:  90 capsule    Refill:  3    Return precautions advised.  Tana Conch, MD

## 2023-11-30 NOTE — Patient Instructions (Addendum)
 Health Maintenance Due  Topic Date Due   Colonoscopy  12/17/2022  [facility] contact Please call to schedule visit and/or procedure Address: [address] [place], [place], [place] [address] Phone: [phone]   Zepbound 2.5 mg trial- likely needs prior authorization and this can take a few weeks with our prior authorization team - continue HIIT and high protein diet  -consider YUKA app  Recommended follow up: Return in about 4 months (around 03/31/2024) for physical or sooner if needed.Schedule b4 you leave.

## 2023-12-01 ENCOUNTER — Telehealth: Payer: Self-pay | Admitting: Pharmacy Technician

## 2023-12-01 ENCOUNTER — Other Ambulatory Visit (HOSPITAL_COMMUNITY): Payer: Self-pay

## 2023-12-01 NOTE — Telephone Encounter (Signed)
 Pharmacy Patient Advocate Encounter  Received notification from [facility] that Prior Authorization for traMADol HCl 50MG  tablets has been APPROVED from 12/01/2023 to 06/02/2024. Unable to obtain price due to refill too soon rejection, last fill date 12/01/2023 next available fill date 12/25/2023   PA #/Case ID/Reference #: [id number]

## 2023-12-01 NOTE — Telephone Encounter (Signed)
 Pharmacy Patient Advocate Encounter   Received notification from CoverMyMeds that prior authorization for traMADol HCl 50MG  tablets is required/requested.   Insurance verification completed.   The patient is insured through [facility] .   Per test claim: PA required; PA submitted to above mentioned insurance via CoverMyMeds Key/confirmation #/EOC [id number] Status is pending

## 2023-12-29 ENCOUNTER — Encounter: Payer: Self-pay | Admitting: Family Medicine

## 2024-02-19 ENCOUNTER — Encounter: Payer: Self-pay | Admitting: Family Medicine

## 2024-03-09 ENCOUNTER — Other Ambulatory Visit: Payer: Self-pay

## 2024-03-09 ENCOUNTER — Emergency Department (HOSPITAL_BASED_OUTPATIENT_CLINIC_OR_DEPARTMENT_OTHER): Admitting: Radiology

## 2024-03-09 ENCOUNTER — Emergency Department (HOSPITAL_BASED_OUTPATIENT_CLINIC_OR_DEPARTMENT_OTHER)

## 2024-03-09 ENCOUNTER — Encounter (HOSPITAL_BASED_OUTPATIENT_CLINIC_OR_DEPARTMENT_OTHER): Payer: Self-pay | Admitting: *Deleted

## 2024-03-09 ENCOUNTER — Emergency Department (HOSPITAL_BASED_OUTPATIENT_CLINIC_OR_DEPARTMENT_OTHER)
Admission: EM | Admit: 2024-03-09 | Discharge: 2024-03-09 | Disposition: A | Discharge status: Home / Self Care | Attending: Emergency Medicine | Admitting: Emergency Medicine

## 2024-03-09 DIAGNOSIS — M545 Low back pain, unspecified: Secondary | ICD-10-CM | POA: Insufficient documentation

## 2024-03-09 DIAGNOSIS — Z79899 Other long term (current) drug therapy: Secondary | ICD-10-CM | POA: Diagnosis not present

## 2024-03-09 DIAGNOSIS — I10 Essential (primary) hypertension: Secondary | ICD-10-CM | POA: Diagnosis not present

## 2024-03-09 DIAGNOSIS — R079 Chest pain, unspecified: Secondary | ICD-10-CM

## 2024-03-09 DIAGNOSIS — R0789 Other chest pain: Secondary | ICD-10-CM | POA: Insufficient documentation

## 2024-03-09 DIAGNOSIS — Z7982 Long term (current) use of aspirin: Secondary | ICD-10-CM | POA: Diagnosis not present

## 2024-03-09 DIAGNOSIS — M25512 Pain in left shoulder: Secondary | ICD-10-CM | POA: Diagnosis not present

## 2024-03-09 DIAGNOSIS — R112 Nausea with vomiting, unspecified: Secondary | ICD-10-CM | POA: Insufficient documentation

## 2024-03-09 DIAGNOSIS — R109 Unspecified abdominal pain: Secondary | ICD-10-CM | POA: Insufficient documentation

## 2024-03-09 LAB — URINALYSIS, ROUTINE W REFLEX MICROSCOPIC
Bilirubin Urine: NEGATIVE
Glucose, UA: NEGATIVE mg/dL
Hgb urine dipstick: NEGATIVE
Ketones, ur: 15 mg/dL — AB
Leukocytes,Ua: NEGATIVE
Nitrite: NEGATIVE
Protein, ur: NEGATIVE mg/dL
Specific Gravity, Urine: 1.015 (ref 1.005–1.030)
pH: 7.5 (ref 5.0–8.0)

## 2024-03-09 LAB — CBC
HCT: 37 % — ABNORMAL LOW (ref 39.0–52.0)
Hemoglobin: 12.2 g/dL — ABNORMAL LOW (ref 13.0–17.0)
MCH: 27.2 pg (ref 26.0–34.0)
MCHC: 33 g/dL (ref 30.0–36.0)
MCV: 82.4 fL (ref 80.0–100.0)
Platelets: 231 10*3/uL (ref 150–400)
RBC: 4.49 MIL/uL (ref 4.22–5.81)
RDW: 17.3 % — ABNORMAL HIGH (ref 11.5–15.5)
WBC: 10.3 10*3/uL (ref 4.0–10.5)
nRBC: 0 % (ref 0.0–0.2)

## 2024-03-09 LAB — BASIC METABOLIC PANEL WITH GFR
Anion gap: 17 — ABNORMAL HIGH (ref 5–15)
BUN: 15 mg/dL (ref 6–20)
CO2: 19 mmol/L — ABNORMAL LOW (ref 22–32)
Calcium: 9.6 mg/dL (ref 8.9–10.3)
Chloride: 102 mmol/L (ref 98–111)
Creatinine, Ser: 1.19 mg/dL (ref 0.61–1.24)
GFR, Estimated: >60 mL/min (ref >60)
Glucose, Bld: 104 mg/dL — ABNORMAL HIGH (ref 70–99)
Potassium: 3.6 mmol/L (ref 3.5–5.1)
Sodium: 138 mmol/L (ref 135–145)

## 2024-03-09 LAB — HEPATIC FUNCTION PANEL
ALT: 37 U/L (ref 0–44)
AST: 35 U/L (ref 15–41)
Albumin: 4.4 g/dL (ref 3.5–5.0)
Alkaline Phosphatase: 79 U/L (ref 38–126)
Bilirubin, Direct: 0.9 mg/dL — ABNORMAL HIGH (ref 0.0–0.2)
Indirect Bilirubin: 1.4 mg/dL — ABNORMAL HIGH (ref 0.3–0.9)
Total Bilirubin: 2.3 mg/dL — ABNORMAL HIGH (ref 0.0–1.2)
Total Protein: 7.6 g/dL (ref 6.5–8.1)

## 2024-03-09 LAB — D-DIMER, QUANTITATIVE: D-Dimer, Quant: <0.27 ug{FEU}/mL (ref 0.00–0.50)

## 2024-03-09 LAB — TROPONIN T, HIGH SENSITIVITY
Troponin T High Sensitivity: <15 ng/L (ref <19)
Troponin T High Sensitivity: <15 ng/L (ref <19)

## 2024-03-09 LAB — LIPASE, BLOOD: Lipase: 42 U/L (ref 11–51)

## 2024-03-09 MED ORDER — METHOCARBAMOL 500 MG PO TABS: 500.0000 mg | ORAL_TABLET | Freq: Two times a day (BID) | ORAL | 0 refills | Status: AC

## 2024-03-09 MED ORDER — ONDANSETRON HCL 4 MG/2ML IJ SOLN
4.0000 mg | Freq: Once | INTRAMUSCULAR | Status: AC
  Administered 2024-03-09: 4 mg via INTRAVENOUS
  Filled 2024-03-09: qty 2

## 2024-03-09 MED ORDER — METHOCARBAMOL 500 MG PO TABS
500.0000 mg | ORAL_TABLET | Freq: Once | ORAL | Status: AC
  Administered 2024-03-09: 500 mg via ORAL
  Filled 2024-03-09: qty 1

## 2024-03-09 MED ORDER — KETOROLAC TROMETHAMINE 15 MG/ML IJ SOLN
15.0000 mg | Freq: Once | INTRAMUSCULAR | Status: AC
Start: 2024-03-09 — End: 2024-03-09
  Administered 2024-03-09: 15 mg via INTRAVENOUS
  Filled 2024-03-09: qty 1

## 2024-03-09 MED ORDER — IOHEXOL 350 MG/ML SOLN
100.0000 mL | Freq: Once | INTRAVENOUS | Status: AC | PRN
  Administered 2024-03-09: 100 mL via INTRAVENOUS

## 2024-03-09 MED ORDER — ONDANSETRON 4 MG PO TBDP: 4.0000 mg | ORAL_TABLET | Freq: Three times a day (TID) | ORAL | 0 refills | Status: AC | PRN

## 2024-03-09 MED ORDER — SODIUM CHLORIDE 0.9 % IV BOLUS
1000.0000 mL | Freq: Once | INTRAVENOUS | Status: AC
  Administered 2024-03-09: 1000 mL via INTRAVENOUS

## 2024-03-09 MED ORDER — DIPHENHYDRAMINE HCL 25 MG PO CAPS
50.0000 mg | ORAL_CAPSULE | Freq: Once | ORAL | Status: AC
  Administered 2024-03-09: 50 mg via ORAL
  Filled 2024-03-09: qty 2

## 2024-03-09 MED ORDER — MORPHINE SULFATE (PF) 4 MG/ML IV SOLN
4.0000 mg | Freq: Once | INTRAVENOUS | Status: AC
  Administered 2024-03-09: 4 mg via INTRAVENOUS
  Filled 2024-03-09: qty 1

## 2024-03-09 MED ORDER — HYDROCODONE-ACETAMINOPHEN 5-325 MG PO TABS: 1.0000 | ORAL_TABLET | ORAL | 0 refills | Status: AC | PRN

## 2024-03-09 NOTE — ED Triage Notes (Addendum)
 Patient to ED reporting Thursday afternoon all of a sudden he had centralized chest pain while driving. Vomiting followed throughout the night Thursday with back pain. Friday morning only back pain persisted when he woke until the afternoon when the chest pain and vomiting started again.   Today patient presents to ED with worsening chest pain, left shoulder pain, nausea, vomiting  and dizziness.   Patient also reporting he has a hx of kidney stones causing similar back pain.

## 2024-03-09 NOTE — Discharge Instructions (Signed)
 1000 mg of Tylenol  every 8 hours.  Take naproxen  every 12 hours.  Use patch over area of pain and alternate ice and heat. Take Robaxin  as needed for muscle spasm and Norco for breakthrough pain but do not take these medicines when driving or operating heavy machinery. I have sent Zofran  as needed for nausea vomiting, ensure you are staying well-hydrated with primarily water you can alternate Pedialyte and Gatorade drink.  Eat 3 regular meals for the day. Follow-up with primary care.  Return to emergency room with new or worsening symptoms.

## 2024-03-09 NOTE — ED Notes (Signed)
Patient given discharge instructions. Questions were answered. Patient verbalized understanding of discharge instructions and care at home. ? ?Discharged with spouse ?

## 2024-03-09 NOTE — ED Notes (Signed)
 Patient to X-ray

## 2024-03-09 NOTE — ED Provider Notes (Signed)
 [facility]PARTMENT AT [facility] Provider Note   CSN: [id number] Arrival date & time: 03/09/24  1255     Patient presents with: Chest Pain and Back Pain   John Costa is a 58 y.o. male.  With past medical history of migraines, hypertension, cerebral aneurysm, GERD, anxiety depression presented to emergency room with complaint of chest pain that started 3 days ago that radiates to his left shoulder and his back associated with nausea and vomiting.  He notes that this started after golfing on Thursday. Chest pain is central and comes and goes, worse with movement and tender to touch. Patient pain is left low back, has history of kidney stone and feels somewhat similar. Of note, patient hypoxic requiring 3L [place], denies fever or cough, no history of O2 at home.  Denies recent travel, recent antibiotic use, history of DVT PE.     Chest Pain Associated symptoms: back pain   Back Pain Associated symptoms: chest pain        Prior to Admission medications   Medication Sig Start Date End Date Taking? Authorizing Provider  acetaminophen  (TYLENOL ) 500 MG tablet Take 2 tablets (1,000 mg total) by mouth 3 (three) times daily. Patient not taking: Reported on 11/30/2023 11/21/17   Vicci Burnard SAUNDERS, PA-C  allopurinol  (ZYLOPRIM ) 100 MG tablet TAKE 1 TABLET BY MOUTH TWICE A DAY 04/06/23   Katrinka Garnette KIDD, MD  amLODipine  (NORVASC ) 5 MG tablet TAKE 1 TABLET (5 MG TOTAL) BY MOUTH DAILY. 04/03/23   Katrinka Garnette KIDD, MD  aspirin  EC 81 MG tablet Take 1 tablet (81 mg total) by mouth daily. Swallow whole. 10/31/23   Jeffrie Oneil JAYSON, MD  CALCIUM  & MAGNESIUM  CARBONATES PO Take 1 tablet by mouth daily.    [provider]  clonazePAM  (KLONOPIN ) 0.5 MG tablet Take 1 tablet (0.5 mg total) by mouth 2 (two) times daily as needed for anxiety. 11/30/23   Katrinka Garnette KIDD, MD  fluticasone (FLONASE) 50 MCG/ACT nasal spray Place 2 sprays into both nostrils daily.    [provider]  L-THEANINE PO Take 1 tablet by mouth daily.    [provider]  metoprolol  succinate (TOPROL -XL) 25 MG 24 hr tablet Take 1 tablet (25 mg total) by mouth daily as needed. Patient not taking: Reported on 11/30/2023 10/31/23   Jeffrie Oneil JAYSON, MD  Multiple Vitamins-Minerals (ONE-A-DAY 50 PLUS PO) Take 1 tablet by mouth.    [provider]  omeprazole (PRILOSEC) 20 MG capsule Take 20 mg by mouth daily.    [provider]  Potassium 99 MG TABS Take 99 mg by mouth daily.    [provider]  rosuvastatin  (CRESTOR ) 10 MG tablet Take 1 tablet (10 mg total) by mouth daily. 10/31/23   Jeffrie Oneil JAYSON, MD  tamsulosin  (FLOMAX ) 0.4 MG CAPS capsule Take 1 capsule (0.4 mg total) by mouth daily after supper. 11/30/23   Katrinka Garnette KIDD, MD  terbinafine  (LAMISIL ) 1 % cream Apply 1 Application topically 2 (two) times daily. 03/29/23   Gershon Donnice SAUNDERS, DPM  tirzepatide  (ZEPBOUND ) 2.5 MG/0.5ML Pen Inject 2.5 mg into the skin once a week. 11/30/23   Katrinka Garnette KIDD, MD  traMADol  (ULTRAM ) 50 MG tablet Take 1 tablet (50 mg total) by mouth every 6 (six) hours as needed for moderate pain (pain score 4-6) or severe pain (pain score 7-10). 11/30/23   Katrinka Garnette KIDD, MD    Allergies: Cheese and Iodinated contrast media    Review  of Systems  Cardiovascular:  Positive for chest pain.  Musculoskeletal:  Positive for back pain.    Updated Vital Signs BP (!) 104/41   Pulse 75   Temp 98.6 F (37 C)   Resp (!) 26   SpO2 98%   Physical Exam Vitals and nursing note reviewed.  Constitutional:      General: He is not in acute distress.    Appearance: He is not toxic-appearing.  HENT:     Head: Normocephalic and atraumatic.   Eyes:     General: No scleral icterus.    Conjunctiva/sclera: Conjunctivae normal.    Cardiovascular:     Rate and Rhythm: Normal rate and regular rhythm.     Pulses: Normal pulses.     Heart sounds: Normal heart sounds.  Pulmonary:     Effort:  Pulmonary effort is normal. No respiratory distress.     Breath sounds: Normal breath sounds.     Comments: Tenderness to palpation to central and left side of chest. Chest:     Chest wall: Tenderness present.  Abdominal:     General: Abdomen is flat. Bowel sounds are normal.     Palpations: Abdomen is soft.     Tenderness: There is no abdominal tenderness. There is left CVA tenderness. There is no right CVA tenderness.   Skin:    General: Skin is warm and dry.     Findings: No lesion.   Neurological:     General: No focal deficit present.     Mental Status: He is alert and oriented to person, place, and time. Mental status is at baseline.     (all labs ordered are listed, but only abnormal results are displayed) Labs Reviewed  BASIC METABOLIC PANEL WITH GFR - Abnormal; Notable for the following components:      Result Value   CO2 19 (*)    Glucose, Bld 104 (*)    Anion gap 17 (*)    All other components within normal limits  CBC - Abnormal; Notable for the following components:   Hemoglobin 12.2 (*)    HCT 37.0 (*)    RDW 17.3 (*)    All other components within normal limits  HEPATIC FUNCTION PANEL - Abnormal; Notable for the following components:   Total Bilirubin 2.3 (*)    Bilirubin, Direct 0.9 (*)    Indirect Bilirubin 1.4 (*)    All other components within normal limits  URINALYSIS, ROUTINE W REFLEX MICROSCOPIC - Abnormal; Notable for the following components:   Ketones, ur 15 (*)    All other components within normal limits  D-DIMER, QUANTITATIVE  LIPASE, BLOOD  TROPONIN T, HIGH SENSITIVITY  TROPONIN T, HIGH SENSITIVITY    EKG: EKG Interpretation Date/Time:  Saturday March 09 2024 13:10:49 EDT Ventricular Rate:  88 PR Interval:  162 QRS Duration:  90 QT Interval:  372 QTC Calculation: 451 R Axis:   42  Text Interpretation: Sinus rhythm Abnormal R-wave progression, early transition Confirmed by Zackowski, Scott [phone]) on 03/09/2024 1:24:58  PM  Radiology: [facility] Chest 2 View Result Date: 03/09/2024 CLINICAL DATA:  Chest pain. EXAM: CHEST - 2 VIEW COMPARISON:  Chest x-ray 04/18/2022. FINDINGS: The heart size and mediastinal contours are within normal limits. Both lungs are clear. No visible pleural effusions or pneumothorax. No acute osseous abnormality. IMPRESSION: No active cardiopulmonary disease. Electronically Signed   By: Gilmore GORMAN Molt M.D.   On: 03/09/2024 13:49     Procedures   Medications Ordered in the ED  sodium chloride  0.9 % bolus 1,000 mL (1,000 mLs Intravenous New Bag/Given 03/09/24 1428)  ondansetron  (ZOFRAN ) injection 4 mg (4 mg Intravenous Given 03/09/24 1434)  morphine  (PF) 4 MG/ML injection 4 mg (4 mg Intravenous Given 03/09/24 1434)  iohexol  (OMNIPAQUE ) 350 MG/ML injection 100 mL (100 mLs Intravenous Contrast Given 03/09/24 1505)  diphenhydrAMINE  (BENADRYL ) capsule 50 mg (50 mg Oral Given 03/09/24 1515)    Clinical Course as of 03/09/24 1829  Sat Mar 09, 2024  1730 On 3 L nasal cannula.  Suspect secondary to pain, not taking deep breath or combination of just receiving the morphine .  When patient was taken off nasal cannula he was satting fine on room air.  Will ambulate with pulse ox. [JB]    Clinical Course User Index [JB] [employer], Warren SAILOR, PA-C                                 Medical Decision Making Amount and/or Complexity of Data Reviewed Labs: ordered. Radiology: ordered.  Risk Prescription drug management.   This patient presents to the ED for concern of chest and back pain, this involves an extensive number of treatment options, and is a complaint that carries with it a high risk of complications and morbidity.  The differential diagnosis includes aortic dissection, pulmonary embolism, pneumothorax, pneumonia, ACS, pancreatitis, cholecystitis   Co morbidities that complicate the patient evaluation  Hypertension    Lab Tests:  I personally interpreted labs.  The pertinent results  include:   CBC without leukocytosis.  Hemoglobin is 12.2 BMP with anion gap of 17 which is likely secondary to his decreased oral intake nausea vomiting Hepatic function panel/total bili 2.3 he has history of Gilbert's syndrome No urinary tract infection Troponin of 15, delta troponin under 15  Imaging Studies ordered:  I ordered imaging studies including CTA chest abdomen pelvis I independently visualized and interpreted imaging which showed no acute findings I agree with the radiologist interpretation   Cardiac Monitoring: / EKG:  The patient was maintained on a cardiac monitor.  I personally viewed and interpreted the cardiac monitored which showed an underlying rhythm of: normal sinus    Problem List / ED Course / Critical interventions / Medication management  Patient presents with complaint of chest pain left shoulder pain and low back pain.  Notes this happened after golfing.  It was associated with nausea vomiting.  He notes that he is in semaglutide and when he originally started this medicine had similar symptoms of nausea vomiting.  On arrival he is hemodynamically stable and well-appearing.  Did get mildly hypoxic after receiving morphine  but did well on room air for over an hour while waiting for reassessment and ambulated with pulse ox and no desaturation.  On exam his pain is reproducible and worse with movement.  Symptoms do not seem consistent with ACS and EKG and troponins negative.  Chest x-ray shows no pneumonia no pneumothorax.  CTA shows no aortic dissection or PE.  Feel symptoms most likely musculoskeletal in nature and perhaps gastritis versus medication side effect as well.  He is feeling much better and requesting discharge.  He has appropriate follow-up. I ordered medication including Toradol , Robaxin , morphine , Zofran  Reevaluation of the patient after these medicines showed that the patient improved I have reviewed the patients home medicines and have made  adjustments as needed   Plan  F/u w/ PCP in 2-3d to ensure resolution of sx.  Patient was given return precautions. Patient stable for discharge at this time.  Patient educated on sx/dx and verbalized understanding of plan. Return to ER w/ new or worsening sx.       Final diagnoses:  Chest pain, unspecified type  Acute left-sided low back pain without sciatica    ED Discharge Orders     None          Shermon Warren SAILOR, PA-C 03/09/24 [facility]    Geraldene Hamilton, MD 03/11/24 2030

## 2024-03-09 NOTE — ED Notes (Signed)
 Transport to CT

## 2024-03-09 NOTE — ED Notes (Signed)
 Pt ambulated on RA in his room. HR was 65-82, Sats 94-99% on RA.  Pt stated he felt a little dizzy.  Pts wife stated he had received pain meds shortly before walk.  RN updated.  Pt left on RA.

## 2024-03-09 NOTE — ED Notes (Signed)
 Pt placed on 3L [place] due to sats dropping into 80 periodically. Sats currently are 97%.  RN notified.

## 2024-03-31 ENCOUNTER — Other Ambulatory Visit: Payer: Self-pay | Admitting: Family Medicine

## 2024-09-18 ENCOUNTER — Other Ambulatory Visit: Payer: Self-pay | Admitting: Family Medicine

## 2024-10-07 ENCOUNTER — Other Ambulatory Visit: Payer: Self-pay | Admitting: Cardiology

## 2024-10-14 MED ORDER — ROSUVASTATIN CALCIUM 10 MG PO TABS: 10.0000 mg | ORAL_TABLET | Freq: Every day | ORAL | 0 refills | Status: AC

## 2024-10-14 NOTE — Telephone Encounter (Signed)
 In accordance with refill protocols, please review and address the following requirements before this medication refill can be authorized:  Labs
